# Patient Record
Sex: Male | Born: 1935 | Race: White | Hispanic: No | Marital: Married | Smoking: Former smoker
Health system: Southern US, Community
[De-identification: ages and names within clinical notes are randomized; demographics above are authoritative.]

## PROBLEM LIST (undated history)

## (undated) DIAGNOSIS — M1990 Unspecified osteoarthritis, unspecified site: Secondary | ICD-10-CM

## (undated) DIAGNOSIS — H269 Unspecified cataract: Secondary | ICD-10-CM

## (undated) DIAGNOSIS — F0390 Unspecified dementia without behavioral disturbance: Secondary | ICD-10-CM

## (undated) DIAGNOSIS — N400 Enlarged prostate without lower urinary tract symptoms: Secondary | ICD-10-CM

## (undated) DIAGNOSIS — K219 Gastro-esophageal reflux disease without esophagitis: Secondary | ICD-10-CM

## (undated) DIAGNOSIS — IMO0001 Reserved for inherently not codable concepts without codable children: Secondary | ICD-10-CM

## (undated) DIAGNOSIS — R413 Other amnesia: Secondary | ICD-10-CM

## (undated) DIAGNOSIS — J302 Other seasonal allergic rhinitis: Secondary | ICD-10-CM

## (undated) DIAGNOSIS — Z5189 Encounter for other specified aftercare: Secondary | ICD-10-CM

## (undated) DIAGNOSIS — I82401 Acute embolism and thrombosis of unspecified deep veins of right lower extremity: Secondary | ICD-10-CM

## (undated) HISTORY — DX: Other seasonal allergic rhinitis: J30.2

## (undated) HISTORY — DX: Unspecified osteoarthritis, unspecified site: M19.90

## (undated) HISTORY — PX: NASAL SINUS SURGERY: SHX719

## (undated) HISTORY — PX: TONSILLECTOMY: SUR1361

## (undated) HISTORY — DX: Reserved for inherently not codable concepts without codable children: IMO0001

## (undated) HISTORY — DX: Acute embolism and thrombosis of unspecified deep veins of right lower extremity: I82.401

## (undated) HISTORY — DX: Benign prostatic hyperplasia without lower urinary tract symptoms: N40.0

## (undated) HISTORY — DX: Gastro-esophageal reflux disease without esophagitis: K21.9

## (undated) HISTORY — DX: Unspecified cataract: H26.9

## (undated) HISTORY — DX: Encounter for other specified aftercare: Z51.89

## (undated) HISTORY — DX: Other amnesia: R41.3

---

## 1957-05-24 HISTORY — PX: OTHER SURGICAL HISTORY: SHX169

## 1988-05-24 HISTORY — PX: CARPAL TUNNEL RELEASE: SHX101

## 1997-12-26 ENCOUNTER — Ambulatory Visit (HOSPITAL_COMMUNITY): Admission: RE | Admit: 1997-12-26 | Discharge: 1997-12-26 | Payer: Self-pay | Admitting: Urology

## 1998-01-08 ENCOUNTER — Ambulatory Visit (HOSPITAL_COMMUNITY): Admission: RE | Admit: 1998-01-08 | Discharge: 1998-01-08 | Payer: Self-pay | Admitting: Internal Medicine

## 1999-06-28 ENCOUNTER — Emergency Department (HOSPITAL_COMMUNITY): Admission: EM | Admit: 1999-06-28 | Discharge: 1999-06-28 | Payer: Self-pay

## 2000-03-02 ENCOUNTER — Encounter: Payer: Self-pay | Admitting: Endocrinology

## 2000-03-02 ENCOUNTER — Encounter: Admission: RE | Admit: 2000-03-02 | Discharge: 2000-03-02 | Payer: Self-pay | Admitting: Endocrinology

## 2000-03-04 ENCOUNTER — Encounter (INDEPENDENT_AMBULATORY_CARE_PROVIDER_SITE_OTHER): Payer: Self-pay | Admitting: Specialist

## 2000-03-04 ENCOUNTER — Other Ambulatory Visit: Admission: RE | Admit: 2000-03-04 | Discharge: 2000-03-04 | Payer: Self-pay | Admitting: Urology

## 2000-03-05 ENCOUNTER — Encounter: Payer: Self-pay | Admitting: Endocrinology

## 2000-03-05 ENCOUNTER — Encounter: Admission: RE | Admit: 2000-03-05 | Discharge: 2000-03-05 | Payer: Self-pay | Admitting: Endocrinology

## 2000-05-10 ENCOUNTER — Encounter (INDEPENDENT_AMBULATORY_CARE_PROVIDER_SITE_OTHER): Payer: Self-pay

## 2000-05-10 ENCOUNTER — Ambulatory Visit (HOSPITAL_COMMUNITY): Admission: RE | Admit: 2000-05-10 | Discharge: 2000-05-10 | Payer: Self-pay | Admitting: Otolaryngology

## 2000-05-24 HISTORY — PX: SHOULDER SURGERY: SHX246

## 2000-09-21 ENCOUNTER — Encounter: Admission: RE | Admit: 2000-09-21 | Discharge: 2000-09-21 | Payer: Self-pay | Admitting: Urology

## 2000-09-21 ENCOUNTER — Encounter: Payer: Self-pay | Admitting: Urology

## 2000-09-23 ENCOUNTER — Encounter: Admission: RE | Admit: 2000-09-23 | Discharge: 2000-09-23 | Payer: Self-pay | Admitting: *Deleted

## 2000-09-23 ENCOUNTER — Encounter: Payer: Self-pay | Admitting: *Deleted

## 2000-09-27 ENCOUNTER — Other Ambulatory Visit: Admission: RE | Admit: 2000-09-27 | Discharge: 2000-09-27 | Payer: Self-pay | Admitting: *Deleted

## 2000-09-27 ENCOUNTER — Encounter (INDEPENDENT_AMBULATORY_CARE_PROVIDER_SITE_OTHER): Payer: Self-pay | Admitting: *Deleted

## 2000-10-11 ENCOUNTER — Encounter
Admission: RE | Admit: 2000-10-11 | Discharge: 2000-11-03 | Payer: Self-pay | Admitting: Physical Medicine and Rehabilitation

## 2000-10-14 ENCOUNTER — Ambulatory Visit (HOSPITAL_COMMUNITY)
Admission: RE | Admit: 2000-10-14 | Discharge: 2000-10-14 | Payer: Self-pay | Admitting: Physical Medicine and Rehabilitation

## 2000-10-14 ENCOUNTER — Encounter: Payer: Self-pay | Admitting: Physical Medicine and Rehabilitation

## 2000-12-06 ENCOUNTER — Encounter: Admission: RE | Admit: 2000-12-06 | Discharge: 2000-12-21 | Payer: Self-pay | Admitting: Orthopedic Surgery

## 2000-12-22 ENCOUNTER — Encounter: Admission: RE | Admit: 2000-12-22 | Discharge: 2001-01-24 | Payer: Self-pay | Admitting: Orthopedic Surgery

## 2001-05-04 ENCOUNTER — Ambulatory Visit (HOSPITAL_BASED_OUTPATIENT_CLINIC_OR_DEPARTMENT_OTHER): Admission: RE | Admit: 2001-05-04 | Discharge: 2001-05-05 | Payer: Self-pay | Admitting: Orthopedic Surgery

## 2001-05-16 ENCOUNTER — Encounter: Admission: RE | Admit: 2001-05-16 | Discharge: 2001-05-23 | Payer: Self-pay | Admitting: Orthopedic Surgery

## 2001-05-24 ENCOUNTER — Encounter: Admission: RE | Admit: 2001-05-24 | Discharge: 2001-08-22 | Payer: Self-pay | Admitting: Orthopedic Surgery

## 2002-04-25 ENCOUNTER — Ambulatory Visit (HOSPITAL_COMMUNITY): Admission: RE | Admit: 2002-04-25 | Discharge: 2002-04-25 | Payer: Self-pay | Admitting: Gastroenterology

## 2002-04-25 ENCOUNTER — Encounter (INDEPENDENT_AMBULATORY_CARE_PROVIDER_SITE_OTHER): Payer: Self-pay | Admitting: Specialist

## 2003-07-12 ENCOUNTER — Emergency Department (HOSPITAL_COMMUNITY): Admission: EM | Admit: 2003-07-12 | Discharge: 2003-07-12 | Payer: Self-pay | Admitting: Emergency Medicine

## 2003-07-23 ENCOUNTER — Encounter: Admission: RE | Admit: 2003-07-23 | Discharge: 2003-09-20 | Payer: Self-pay | Admitting: Orthopedic Surgery

## 2003-10-02 ENCOUNTER — Ambulatory Visit (HOSPITAL_COMMUNITY): Admission: RE | Admit: 2003-10-02 | Discharge: 2003-10-02 | Payer: Self-pay | Admitting: Orthopedic Surgery

## 2004-12-22 ENCOUNTER — Ambulatory Visit: Payer: Self-pay | Admitting: Gastroenterology

## 2005-03-29 ENCOUNTER — Ambulatory Visit (HOSPITAL_COMMUNITY): Admission: RE | Admit: 2005-03-29 | Discharge: 2005-03-29 | Payer: Self-pay | Admitting: Urology

## 2005-03-29 ENCOUNTER — Ambulatory Visit (HOSPITAL_BASED_OUTPATIENT_CLINIC_OR_DEPARTMENT_OTHER): Admission: RE | Admit: 2005-03-29 | Discharge: 2005-03-29 | Payer: Self-pay | Admitting: Urology

## 2005-03-29 ENCOUNTER — Encounter (INDEPENDENT_AMBULATORY_CARE_PROVIDER_SITE_OTHER): Payer: Self-pay | Admitting: *Deleted

## 2006-09-13 ENCOUNTER — Ambulatory Visit: Payer: Self-pay | Admitting: Gastroenterology

## 2006-11-04 ENCOUNTER — Ambulatory Visit: Payer: Self-pay | Admitting: Gastroenterology

## 2006-11-14 ENCOUNTER — Ambulatory Visit: Payer: Self-pay | Admitting: Gastroenterology

## 2006-12-08 ENCOUNTER — Ambulatory Visit (HOSPITAL_BASED_OUTPATIENT_CLINIC_OR_DEPARTMENT_OTHER): Admission: RE | Admit: 2006-12-08 | Discharge: 2006-12-08 | Payer: Self-pay | Admitting: Urology

## 2006-12-08 ENCOUNTER — Encounter (INDEPENDENT_AMBULATORY_CARE_PROVIDER_SITE_OTHER): Payer: Self-pay | Admitting: Urology

## 2007-05-23 ENCOUNTER — Encounter: Admission: RE | Admit: 2007-05-23 | Discharge: 2007-07-26 | Payer: Self-pay | Admitting: Orthopedic Surgery

## 2008-04-12 ENCOUNTER — Ambulatory Visit (HOSPITAL_COMMUNITY): Admission: RE | Admit: 2008-04-12 | Discharge: 2008-04-13 | Payer: Self-pay | Admitting: Otolaryngology

## 2008-04-12 ENCOUNTER — Encounter (INDEPENDENT_AMBULATORY_CARE_PROVIDER_SITE_OTHER): Payer: Self-pay | Admitting: Otolaryngology

## 2010-10-06 NOTE — Op Note (Signed)
NAME:  Raymond Hopkins, Raymond Hopkins             ACCOUNT NO.:  [id number]   MEDICAL RECORD NO.:  [id number]          PATIENT TYPE:  AMB   LOCATION:  [facility]                         FACILITY:  [facility]   PHYSICIAN:  Sigmund I. Patsi Sears, M.D.DATE OF BIRTH:  [birth year]   DATE OF PROCEDURE:  12/08/2006  DATE OF DISCHARGE:                               OPERATIVE REPORT   PREOPERATIVE DIAGNOSIS:  Elevated PSA.   POSTOPERATIVE DIAGNOSIS:  Elevated PSA.   OPERATION:  Prostate ultrasound and biopsy.   SURGEON:  Sigmund I. Patsi Sears, M.D.   ANESTHESIA:  General LMA.   PREPARATION:  After appropriate preanesthesia, the patient is brought to  the operating room and placed on the operating table in the dorsal  supine position where general LMA anesthesia was induced.  He was then  replaced in the dorsal lithotomy position where the pubis was prepped  with Betadine solution and draped in the usual fashion.   REVIEW OF HISTORY:  This 76 year old male has a history of PSA rise from  9.96 to 16.35.  The patient is unable to tolerate prostate biopsy in the  office and is for anesthesia with prostate ultrasound and biopsy.   PROCEDURE:  Prostate ultrasound was accomplished in the left lateral  decubitus position which shows a prostate size of 50 grams.  The seminal  vesicles were normal.  There were multiple areas of hypoechogenicity  within the prostate.  Multiple cores of tissue were taken, 12, from the  prostate, without difficulty.  There was no bleeding.  The patient was  awakened and taken to the recovery room in good condition.      Sigmund I. Patsi Sears, M.D.  Electronically Signed     SIT/MEDQ  D:  12/08/2006  T:  12/08/2006  Job:  [id number]

## 2010-10-06 NOTE — Discharge Summary (Signed)
NAME:  COPELAND, NEISEN NO.:  [id number]   MEDICAL RECORD NO.:  [id number]          PATIENT TYPE:  REC   LOCATION:  [facility]                         FACILITY:  [facility]   PHYSICIAN:  Zenia Resides, NP      DATE OF BIRTH:  15-Dec-1934   DATE OF ADMISSION:  05/23/2007  DATE OF DISCHARGE:                               DISCHARGE SUMMARY   </ADDENDUM  This is an addendum to job number [id number].   FOLLOWUP:  Ms. Strite was set up for followup with Dr. Amada Kingfisher-  Bonsu on Tuesday, the 20th, at 2:10 p.m.  She was instructed that if she  does indeed keep this appointment then she can cancel her followup  appointment with the nurse practitioner, Tammy Parrett, as followup care  will be provided.      Zenia Resides, NP     PB/MEDQ  D:  06/06/2007  T:  06/06/2007  Job:  [id number]

## 2010-10-06 NOTE — Op Note (Signed)
Raymond Hopkins, Raymond Hopkins             ACCOUNT NO.:  [id number]   MEDICAL RECORD NO.:  [id number]          PATIENT TYPE:  AMB   LOCATION:  [facility]                          FACILITY:  [facility]   PHYSICIAN:  Hermelinda Medicus, M.D.   DATE OF BIRTH:  27-Aug-1934   DATE OF PROCEDURE:  DATE OF DISCHARGE:                               OPERATIVE REPORT   PREOPERATIVE DIAGNOSIS:  Right ethmoid or anterior ethmoid and frontal  sinusitis.   POSTOPERATIVE DIAGNOSIS:  Right ethmoid or anterior ethmoid and frontal  sinusitis.   OPERATION:  Functional endoscopic sinus surgery with a right anterior  ethmoidectomy and frenulectomy.   OPERATOR:  Hermelinda Medicus, MD.   ANESTHESIA:  Local MAC with Dr. Noreene Larsson, 1% Xylocaine with epinephrine 4  mL and topical cocaine 200 mg.   The patient is aware of the risks and gains.  He is aware of the fact  that we are working around the base of skull and around the eye.  He has  had previous surgery in 2001 where he had bilateral frontal  ethmoidectomies and maxillary sinus ostial enlargements, but he has  considerable sinusitis issues with allergy issues with considerable  exposure to lost and allergens.  He is aware that he cannot do a lot of  heavy lifting right away and is to be alerted to these allergens and  exposed these.   PROCEDURE:  The patient was placed in the supine position under local  MAC anesthesia and was prepped and draped in the usual manner.  The  inferior turbinate was reduced slightly and the middle turbinate was  pushed right over against the ethmoid sinus.  We reduced that and pushed  it medially where we could then see the polypoid debris in the anterior  ethmoid sinus that was leading up into the frontal sinus.  We were able  to remove this polypoid debris using a 0-degree and a 70-degree scope  and then using a curved suction, we could place the suction up out into  the frontal sinus natural ostium, being very careful not to damage the  delicate mucous membrane.  This ostium was quite small, essentially  slightly larger than the diameter of our curved suction, making a little  more fragile and prone to obstruction, but now the polyps were removed  and that the scar tissue around the anterior ethmoid sinus was removed.  I think with the situation we would do very well.  Once this was  completed, we held the middle turbinate medial using a half of the  Gelfoam suction and then Telfa was placed in the right nose.  The  patient tolerated the procedure very well and was doing well  postoperatively.  His followup would be a 23-hour observation and then  he would be in my office in 3 days, then 10 days, 3 weeks, 6 weeks, and  3 months.           ______________________________  Hermelinda Medicus, M.D.     JC/MEDQ  D:  04/12/2008  T:  04/12/2008  Job:  [id number]   cc:   Pollyann Savoy, MD

## 2010-10-06 NOTE — H&P (Signed)
NAME:  Raymond Hopkins, Raymond Hopkins NO.:  [id number]   MEDICAL RECORD NO.:  [id number]          PATIENT TYPE:  OIB   LOCATION:                               FACILITY:  [facility]   PHYSICIAN:  Hermelinda Medicus, M.D.   DATE OF BIRTH:  Oct 25, 1934   DATE OF ADMISSION:  04/12/2008  DATE OF DISCHARGE:                              HISTORY & PHYSICAL   This patient is a 76 year old male who has been worked formally for [employer], is now retired, and has had bilateral functional endoscopic  sinus surgery back in 2001 where he had bilateral frontal, ethmoid, and  maxillary sinus ostial enlargement.  He has done really quite well, but  he does have considerable number of allergies and has taken Allegra and  Zyrtec and the usual allergy medications, but he also has 2 wood  fireplaces in his house and really the house is quite overheated and  dusty and dry.  He tries to use a humidifier also.  He also is very  active in outdoors in working with trees and grasses, and with his  allergies, he has had some sinus issues.  Since that time, he has been  on Z-Pak and Ceclor, Ceftin, Cipro.  Considerable history when Tequin  was used for this and then more recently Levaquin, and he also takes  Allegra 180.  He continued to have right frontal ethmoid region  headaches and a CAT scan was done showing an anterior ethmoid and  frontal sinusitis with fluid levels, and he now enters for a functional  endoscopic approach to the ethmoid and frontal sinus on the right.   His past history is remarkable in the fact that he has had prostate  hypertrophy.  He has had history of panic attacks and claustrophobia,  benign positional vertigo, gastroesophageal reflux.  He had a pilonidal  cystectomy and hemorrhoidectomy years ago and has had bilateral carpal  tunnel releases.  He has also had nasal fracture with reconstruction  multiple years back.   His medicines are listed in the chart, and he has no allergies  to  medications.   His EKG shows a sinus bradycardia, otherwise normal.  His rate is 59.  Hiatal hernia on chest x-ray, no active disease.   PHYSICAL EXAMINATION:  VITAL SIGNS:  Blood pressure of 124/79 and a  pulse of 54.  HEENT:  The ears are clear.  The tympanic membranes are clear.  In the  past, he has had mastoid disease but at this point, his temporal bones  are in good condition, and he has no evidence of any otitis media.  His  nose is clear as well as we can get this.  He has been on antibiotics  recently.  He has been on sulfa and Cipro for prostate swelling but has  been on antibiotics for his sinuses as well.  Oral cavity is clear.  Nasopharynx is clear of any ulceration or mass.  Larynx is clear.  True  cords, false cords, epiglottis, base of tongue are clear of ulceration  or mass.  True cord mobility, gag reflex, tongue mobility,  EOMs, facial  nerve are all symmetrical.  His lips, teeth, and gums are unremarkable.  Posterior triangles and scalp and skin are clear.  NECK:  Free of any thyromegaly, cervical adenopathy, or mass.  No  salivary gland abnormalities.  CHEST:  Clear.  No rales, rhonchi, or wheezes.  CARDIOVASCULAR:  No rubs, murmurs, or gallops.  EXTREMITIES:  Unremarkable.  He does have some arthritic issues and  takes medication for this.   His diagnoses are:  1. Right frontal ethmoid sinusitis, history of bilateral frontal,      ethmoid, and maxillary sinusitis status post functional endoscopic      sinus surgery.  2. History of allergic rhinitis.  3. History of severe dryness secondary to use of a woodstove.   Our plan is to do functional endoscopic sinus surgery and right frontal  ethmoidectomy under local MAC anesthesia.  He will be kept overnight, 1  night as a 23-hour observation because of his age.           ______________________________  Hermelinda Medicus, M.D.     JC/MEDQ  D:  04/12/2008  T:  04/12/2008  Job:  [id number]   cc:   Dr. Lynett Fish

## 2010-10-09 NOTE — H&P (Signed)
[facility]  Patient:    Raymond Hopkins, Raymond Hopkins                    MRN: [id number] Adm. Date:  [id number] Attending:  Waldon Merl CC:         Alfonse Alpers. Dagoberto Ligas, M.D., [address], [address], [place], [place]  [address]                         History and Physical  HISTORY OF PRESENT ILLNESS:  This patient is a 76 year old male who enters with a six-month history of sinusitis problems.  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  He has been also on antibiotics on several occasions for his sinuses, more recently has been Z-Pak and Tequin, and he obtained an MRI recently under Dr. Alfonse Alpers. Gegicks care which showed complete opacification of his ethmoid sinuses, some opacification of his frontal and partial opacification of the maxillary sinus.  His nose does not show excessive polyposis but he does have very large turbinates, especially on the right, approaching the size of a concha bullosa.  With this and with this long history of headache and essentially pain located around the left frontal ethmoid region primarily, he now enters for functional endoscopic sinus surgery to see if we can open both ethmoid sinuses, frontal sinuses and maxillary sinuses.  ALLERGIES:  He has no allergies to medications.  MEDICATIONS 1. Zithromax 250 mg q.d. 2. Aquatab D Dosepak one-half tablet. 3. Prilosec 25 mg b.i.d. 4. Doxazosin mesylate 8 mg one-half tablet q.d. 5. Testosterone patch. 6. Tri-mix injection twice a week.  PAST HISTORY:  He has had a bilateral release of a carpal tunnel syndrome, pilonidal cystectomy, hemorrhoidectomy, hemorrhoidectomy, a T&A and surgery for a nasal fracture several years ago and  had a nasal reconstruction with a good septal status.  He has a history of panic attacks and claustrophobia but he takes no medicine for this.  He is a very active individual physically -- he plays basketball and does a great deal of project-type work.  He has had some eosinophilic fasciitis, in remission, started in 1991, and he sees ______ in [place] about this.  He does have a reflux esophagitis, for which he is treated with the Prilosec.  He has a somewhat large prostate with hesitancy.  PHYSICAL EXAMINATION  VITAL SIGNS:  His physical examination reveals a blood pressure of 118/84 with a pulse of 60 and respirations 20.  HEENT:  His ears are clear.  Tympanic membranes look clear.  Nose:  He has had surgery in the past.  The septum is quite straight.  He does have quite large turbinates, in that he has considerable evidence of and history of sinusitis. Oral cavity looks clear of any ulceration or mass.  Nasopharynx is clear of any ulceration or mass.  The epiglottis and larynx looks free of any ulceration or mass.  No true cord or false cord, base of tongue, lateral pharyngeal wall abnormalities.  NECK:  His neck is free of any thyromegaly, cervical adenopathy or mass.  CHEST:  Clear.  No rales, rhonchi or wheezes.  CARDIOVASCULAR:  No opening snaps,  murmurs or gallops.  ABDOMEN:  Free of any organomegaly, tenderness or mass.  EXTREMITIES:  Unremarkable except for previous surgical procedures.  NEUROLOGIC:  Oriented x 3.  Cranial nerves intact.  INITIAL DIAGNOSES 1. Bilateral pansinusitis, ethmoid, frontal and maxillary, with history of    allergic rhinitis. 2. He also has a history of panic attacks and claustrophobia. 3. He also has this eosinophilic fasciitis. 4. Benign positional vertigo. 5. Gastroesophageal reflux and is on Prilosec. 6. History of pilonidal cystectomy and hemorrhoidectomy and the bilateral    carpal tunnel releases. 7. History of nasal  fracture with reconstruction.  LABORATORY AND X-RAY FINDINGS:  His chest x-ray and EKG are within normal limits. DD:  05/10/00 TD:  05/11/00 Job: [id number]

## 2010-10-09 NOTE — Op Note (Signed)
[facility]  Patient:    Raymond Hopkins, Raymond Hopkins                    MRN: [id number] Proc. Date: 05/10/00 Adm. Date:  [id number] Attending:  Waldon Merl CC:         Alfonse Alpers. Dagoberto Ligas, M.D., [address], [address], [place], [place] [address]                           Operative Report  PREOPERATIVE DIAGNOSIS:  Bilateral ethmoid and maxillary and frontal sinusitis.  POSTOPERATIVE DIAGNOSIS:  Bilateral ethmoid and maxillary and frontal sinusitis.  OPERATION:  Functional endoscopic sinus surgery with a bilateral ethmoidectomy, frontalotomy, and maxillary sinus ostial enlargement with reduction of turbinates.  SURGEON:  Keturah Barre, M.D.  ANESTHESIA:  Local 1% Xylocaine with epinephrine, topical cocaine 200 mg, and with anesthesia MAC with Dr. Shireen Quan.  DESCRIPTION OF PROCEDURE:  The patient was placed in the supine position under local anesthesia was prepped and draped in the appropriate manner using Hibiclens x 3 and the usual head drape.  Once the topical anesthesia was instilled, then 1% Xylocaine with epinephrine was also used, and the left ethmoid was first approached.  The MRIs were in the operating room for further study of the anatomy of the sinuses.  The patient was also well aware of the risks and gains and had been discussed about the gains for his breathing, for his sinus drainage, but also was aware of the risk of working around the frontal lobe and the orbits.  The first sinus approached was using the 0 degree scope and the straight Blakesley-Wildes, and the upbiting Blakesley-Wildes where a considerable amount of polypoid debris was removed as well as some thick mucoid material which was suctioned.  The patient has already been on antibiotic and will be continued on antibiotic intravenously. Once the ethmoid sinus was opened, then we could use the scope to see more superiorly and used the 70 degree scope and worked our way  superior and anterior where we could see up into the frontal sinus, the natural ostium, and this was just found to be filled with polypoid debris, but we were able to remove this via the frontal region and the ethmoid sinus.  This was the major area of pain and once this was opened, feel that this should really help control this pain.  With the ethmoid clear, and with the ethmoid volar completely taken down, the middle turbinate was pushed medially aggressively, so it would give plenty of space for the ethmoid to drain.  Then the maxillary sinus was reviewed, and the uncinate process was then located.  The natural ostium could not be seen.  It was buried in polypoid disease and scar tissue and mucous membrane changes.  The curved suction was used to palpate in this area and find the natural ostium and then the side-biting forcep and the angled Blakesley-Wildes were used to increase this natural ostium to about five times its normal size.  We could look inside the sinus now and see the gray, thickened mucous membrane with reasonable drainage.  I felt that this membrane would at least in part revert back to a more normal status.  This sinus was suctioned.  Attention was then carried to the right side, where again the ethmoid sinus was approached, pushing the middle turbinate medial, reducing the inferior turbinate aggressively.  The polyp of the sinus  was found to be filled with polyps.  These polyps were then removed.  We worked up anteriorly and superiorly as closely as possible.  The frontal sinus ostium was found to be quite small.  We removed all of the polyps.  The polyps along the base of the skull were also found to be just moderate, and we worked posteriorly toward the sphenoid sinus.  Once the ethmoid sinus was completely opened, we felt that all of the cells were opened, and the mucous was suctioned from these cells, and then again, the area of the maxillary sinus was approached.   Again, the uncinate process region was buried in polypoid debris and scar tissue, and again we needed to find this just by palpation which we did using the curved suction.  The side-biting forcep and Blakesley-Wildes were used to again increase the size of this, and we used the 0 and the 70 degree scopes to evaluate the ethmoid, frontal, and maxillary sinuses on this side and once we opened the sinus, we could suction the maxillary sinus, and then the thick, gray membrane that was seen on the opposite side was again seen.  Again this was left intact, as I think it will revert back to a more normal status.  Once this was achieved, a thin gel film and Gelfoam was placed within the ethmoid sinus.  Telfa was placed within the nose, and the patient tolerated the procedure very well and was taken to recovery room in good condition.  The patients follow-up will be tomorrow and then in one week, three weeks, six weeks, six months, and a year. DD:  05/10/00 TD:  05/11/00 Job: [id number]

## 2010-10-09 NOTE — Op Note (Signed)
[place]. [facility]  Patient:    Raymond Hopkins, Raymond Hopkins Visit Number: [id number] MRN: [id number]          Service Type: DSU Location: [facility] Attending Physician:  Cain Sieve Dictated by:   Vania Rea. Supple, M.D. Proc. Date: 05/04/01 Admit Date:  05/04/2001                             Operative Report  PREOPERATIVE DIAGNOSES: 1. 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 2. Right shoulder acromioclavicular joint arthrosis. 3. Possible rotator cuff tear.  POSTOPERATIVE DIAGNOSES: 1. Right shoulder superior labral tear (type 2 superior labrum anterior and    posterior lesion). 2. 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 3. Right shoulder acromioclavicular joint arthrosis. 4. Significant partial-thickness articular sided rotator cuff tear. 5. Extensive degenerative posterior labral tear.  PROCEDURES: 1. Right shoulder glenohumeral joint diagnostic arthroscopy. 2. Debridement of extensive posterior labral tear. 3. Repair of type 2 superior labrum anterior and posterior lesion. 3. Debridement of partial-thickness articular sided rotator cuff tear. 4. Arthroscopic subacromial decompression and bursectomy. 5. Arthroscopic distal clavicle resection.  SURGEON OF RECORD:  Vania Rea. Supple, M.D.  ASSISTANT:  Alexzandrew L. Perkins, P.A.-C.  ANESTHESIA:  General endotracheal.  ESTIMATED BLOOD LOSS:  Minimal.  HISTORY:  Raymond Hopkins is a 76 year old gentleman, who has had persistent difficulties with right shoulder pain particularly when he performs overhead reaching and throwing.  His examination showed positive OBriens test and positive impingement sign suggestive of rotator cuff versus labral pathology.  MRI scan does confirm significant tendinosis of the rotator cuff.  Due to his ongoing pain and functional limitations, he is brought to the operating room at this time for planned right shoulder  arthroscopy as described above.  Preoperatively, he was counselled on treatment options, as well as risks versus benefits thereof, possible complications, bleeding, infection, neurovascular injury, persistence of pain, loss of motion, and possible need for additional surgery were all reviewed.  He understands and accepted and agreed with our planned procedure.  PROCEDURE IN DETAIL:  After undergoing routine preoperative evaluation, an attempt was made at placing an interscalene block.  The patient, however, was unable to tolerate the procedure.  It was then aborted.  Subsequently was placed supine on the operating table and underwent smooth induction of a general endotracheal anesthesia.  He was turned to the left lateral decubitus position on a bean bag and appropriately padded and protected.  The right arm was suspended to 70/30 position with 10 pounds of traction.  An examination under anesthesia showed no obvious glenohumeral instability.  The shoulder girdle region was sterilely prepped and draped in a standard fashion. Received prophylactic antibiotics.  Standard posterior portals were established in the glenohumeral joint and the anterior portal was established under direct visualization.  The glenohumeral articular surfaces were in generally good condition, although, the glenoid did show some diffuse mild chondromalacia.  There was evidence for significant tearing of the anterior/superior labrum with complete detachment of the biceps anchor from the 11 oclock to the 1 oclock position.  In addition, there was an extensive degenerative tear of the posterior labrum, although the labrum was not detached, but rather it was degenerated on its most peripheral margin.  We introduced the shaver from the posterior portal and performed an extensive debridement of the degenerative posterior labral tear down to stable-appearing tissue.  There was no obvious disruption of the posterior capsular  attachments however.  Next, we evaluated the rotator cuff and there was evidence for a significant partial-thickness articular sided rotator cuff tear and this was debrided with the shaver to healthy-appearing tissue.  The biceps tendon at the level of the rotator cuff interval was in good condition.  We then established an accessory portal off the lateral margin of the acromion entering the joint just posterior to the biceps and passing through the rotator cuff at the musculotendinous junction.  A bur was introduced and we created a bony cancellous trough from the 11 oclock to the 1 oclock position across the superior glenoid.  The shaver was introduced and used to remove all residual soft tissue debris.  We placed two Arthrex Biofast tack suture anchors - one at the 11:30 position, and one at the 1 oclock position.  The free limbs of the suture anchor were then passed through the superior labrum and the biceps anchor utilizing the Mitek suture retriever.  These were then tied utilizing a standard sliding arthroscopic notch with multiple overhand throws and alternating posts. This brought the superior labrum and biceps anchor down into excellent apposition against the cancellous bony bed on the superior glenoid.  We then completed the inspection of the glenohumeral joint and did perform a general chondroplasty of the glenoid and removed residual debris.  Fluid and instruments were then removed.  I should mention that prior to making the accessory portal, we did drop the arm down to approximately 50 degrees of abduction.  After completion of the glenohumeral work, the arm was then dropped down to 30 degrees of abduction with the arthroscopic introduced in the subacromial space in the posterior portal and directed lateral portal sitting in the subacromial space.  Bony proliferative bursal tissue was removed with a combination of the shaver and the Arthrex wand.  The wand was used to remove  the periosteum from the undersurface at the anterior half of the acromion.  CA ligament was divided and resected distally and the wand was  used for hemostasis.  Bur was then used to perform a subacromial decompression creating a type 1 morphology.  A portal was then established directly anterior to the distal clavicle and distal clavicle resection was performed with the bur.  Care was taken to make sure that the entire circumference of the distal clavicle could be visualized to ensure adequate removal of bone.  We then completed the subacromial bursectomy and carefully inspected the bursal surface of the rotator cuff.  This showed no obvious full-thickness tears. There was some subtle fraying at the level of the greater tuberosity, but probing in this area showed an excellent caliber of the residual tendon and it was not felt that any type of repair was necessary.  At this point, we achieved final hemostasis.  Fluid and instruments were removed.  The portals were closed with Steri-Strips.  Thirty cc of 0.5% Marcaine with epinephrine was instilled about the portals and into the subacromial space.  A bulky, dry dressing was then taped about the right shoulder.  The right arm was placed in a shoulder immobilizer.  The patient was rolled supine, extubated and taken to recovery room in stable condition. Dictated by:   Vania Rea. Supple, M.D. Attending Physician:  Cain Sieve DD:  05/04/01 TD:  05/04/01 Job: [phone]

## 2010-10-09 NOTE — Op Note (Signed)
NAME:  OLUWADAMILOLA, ROSAMOND             ACCOUNT NO.:  [id number]   MEDICAL RECORD NO.:  [id number]          PATIENT TYPE:  AMB   LOCATION:  [facility]                         FACILITY:  [facility]   PHYSICIAN:  Sigmund I. Patsi Sears, M.D.DATE OF BIRTH:  [birth year]   DATE OF PROCEDURE:  03/29/2005  DATE OF DISCHARGE:                                 OPERATIVE REPORT   PREOPERATIVE DIAGNOSIS:  Elevated PSA.   POSTOPERATIVE DIAGNOSIS:  Elevated PSA.   OPERATION:  Transrectal needle biopsy of the prostate and prostate  ultrasound.   SURGEON:  Sigmund I. Patsi Sears, M.D.   PREPARATION:  After appropriate preanesthesia, the patient was brought to  the operating room, placed on the operating table in dorsal supine position  where a general LMA anesthesia was introduced. He was then replaced in the  left lateral decubitus position where a spinal anesthetic was introduced.  The prostate was ultrasounded, and was found to be  47.64 mL in volume. The  rectal wall and seminal vesicles were within normal limits.   Twelve cords of tissue were taken from various sites in the prostate, and  sent to the laboratory for examination. The patient had no bleeding. He was  awakened and taken to the recovery room in good condition.      Sigmund I. Patsi Sears, M.D.  Electronically Signed     SIT/MEDQ  D:  03/29/2005  T:  03/30/2005  Job:  [id number]

## 2011-02-23 LAB — URINALYSIS, ROUTINE W REFLEX MICROSCOPIC
Bilirubin Urine: NEGATIVE
Glucose, UA: NEGATIVE
Hgb urine dipstick: NEGATIVE
Ketones, ur: NEGATIVE
Nitrite: NEGATIVE
Protein, ur: NEGATIVE
Specific Gravity, Urine: 1.027
Urobilinogen, UA: 0.2
pH: 7

## 2011-02-23 LAB — CBC
HCT: 40.2
Hemoglobin: 13.7
MCHC: 34.2
MCV: 92.7
Platelets: 174
RBC: 4.34
RDW: 12.4
WBC: 3.3 — ABNORMAL LOW

## 2011-03-08 LAB — POCT HEMOGLOBIN-HEMACUE: Hemoglobin: 13.8

## 2011-07-15 ENCOUNTER — Telehealth: Payer: Self-pay | Admitting: Gastroenterology

## 2011-07-16 NOTE — Telephone Encounter (Signed)
Patient has been getting his Protonix from his PCP but he is trying to find a new PCP and wants to see if Dr Jarold Motto will fill the rx I have advised pt he will need OV for any rxs, since he has not seen Dr Jarold Motto since 2008 he said he will just find a new PCP.

## 2011-09-15 ENCOUNTER — Encounter: Payer: Self-pay | Admitting: Gastroenterology

## 2011-09-20 ENCOUNTER — Encounter: Payer: Self-pay | Admitting: Gastroenterology

## 2011-10-06 ENCOUNTER — Ambulatory Visit (AMBULATORY_SURGERY_CENTER): Payer: Medicare Other | Admitting: *Deleted

## 2011-10-06 ENCOUNTER — Encounter: Payer: Self-pay | Admitting: Gastroenterology

## 2011-10-06 VITALS — Ht 68.5 in | Wt 166.0 lb

## 2011-10-06 DIAGNOSIS — Z1211 Encounter for screening for malignant neoplasm of colon: Secondary | ICD-10-CM

## 2011-10-06 MED ORDER — PEG-KCL-NACL-NASULF-NA ASC-C 100 G PO SOLR: ORAL | Status: DC

## 2011-10-20 ENCOUNTER — Ambulatory Visit (AMBULATORY_SURGERY_CENTER): Payer: Medicare Other | Admitting: Gastroenterology

## 2011-10-20 ENCOUNTER — Encounter: Payer: Self-pay | Admitting: Gastroenterology

## 2011-10-20 VITALS — BP 131/64 | HR 45 | Temp 95.4°F | Resp 18 | Ht 68.0 in | Wt 166.0 lb

## 2011-10-20 DIAGNOSIS — Z1211 Encounter for screening for malignant neoplasm of colon: Secondary | ICD-10-CM

## 2011-10-20 DIAGNOSIS — Z86010 Personal history of colonic polyps: Secondary | ICD-10-CM

## 2011-10-20 MED ORDER — SODIUM CHLORIDE 0.9 % IV SOLN: 500.0000 mL | INTRAVENOUS | Status: DC

## 2011-10-20 NOTE — Op Note (Signed)
[facility] [address] [place], [place]  [address]  COLONOSCOPY PROCEDURE REPORT  PATIENT:  Raymond Hopkins, Raymond Hopkins  MR#:  [id number] BIRTHDATE:  Sep 03, 1934, 76 yrs. old  GENDER:  male ENDOSCOPIST:  Vania Rea. Jarold Motto, MD, [facility] REF. BY: PROCEDURE DATE:  10/20/2011 PROCEDURE:  Colonoscopy 19147 ASA CLASS:  Class II INDICATIONS:  history of polyps MEDICATIONS:   propofol (Diprivan) 100 mg IV  DESCRIPTION OF PROCEDURE:   After the risks and benefits and of the procedure were explained, informed consent was obtained. Digital rectal exam was performed and revealed no abnormalities. The LB CF-[id number] endoscope was introduced through the anus and advanced to the cecum, which was identified by both the appendix and ileocecal valve.  The quality of the prep was good, using MoviPrep.  The instrument was then slowly withdrawn as the colon was fully examined. <<PROCEDUREIMAGES>>  FINDINGS:  No polyps or cancers were seen.  This was otherwise a normal examination of the colon.   Retroflexed views in the rectum revealed no abnormalities.    The scope was then withdrawn from the patient and the procedure completed.  COMPLICATIONS:  None ENDOSCOPIC IMPRESSION: 1) No polyps or cancers 2) Otherwise normal examination RECOMMENDATIONS: f/u prn.2 negative exams over 10y period.  REPEAT EXAM:  No  ______________________________ Vania Rea. Jarold Motto, MD, [facility]  CC:  n. eSIGNED:   Vania Rea. Lynda Wanninger at 10/20/2011 01:57 PM  Lauretta Chester, [phone]

## 2011-10-20 NOTE — Progress Notes (Signed)
Patient did not experience any of the following events: a burn prior to discharge; a fall within the facility; wrong site/side/patient/procedure/implant event; or a hospital transfer or hospital admission upon discharge from the facility. (G8907) Patient did not have preoperative order for IV antibiotic SSI prophylaxis. (G8918)  

## 2011-10-20 NOTE — Patient Instructions (Signed)
YOU HAD AN ENDOSCOPIC PROCEDURE TODAY AT THE [facility]: Refer to the procedure report that was given to you for any specific questions about what was found during the examination.  If the procedure report does not answer your questions, please call your gastroenterologist to clarify.  If you requested that your care partner not be given the details of your procedure findings, then the procedure report has been included in a sealed envelope for you to review at your convenience later.  YOU SHOULD EXPECT: Some feelings of bloating in the abdomen. Passage of more gas than usual.  Walking can help get rid of the air that was put into your GI tract during the procedure and reduce the bloating. If you had a lower endoscopy (such as a colonoscopy or flexible sigmoidoscopy) you may notice spotting of blood in your stool or on the toilet paper. If you underwent a bowel prep for your procedure, then you may not have a normal bowel movement for a few days.  DIET: Your first meal following the procedure should be a light meal and then it is ok to progress to your normal diet.  A half-sandwich or bowl of soup is an example of a good first meal.  Heavy or fried foods are harder to digest and may make you feel nauseous or bloated.  Likewise meals heavy in dairy and vegetables can cause extra gas to form and this can also increase the bloating.  Drink plenty of fluids but you should avoid alcoholic beverages for 24 hours.  ACTIVITY: Your care partner should take you home directly after the procedure.  You should plan to take it easy, moving slowly for the rest of the day.  You can resume normal activity the day after the procedure however you should NOT DRIVE or use heavy machinery for 24 hours (because of the sedation medicines used during the test).    SYMPTOMS TO REPORT IMMEDIATELY: A gastroenterologist can be reached at any hour.  During normal business hours, 8:30 AM to 5:00 PM Monday through Friday,  call [phone].  After hours and on weekends, please call the GI answering service at [phone] who will take a message and have the physician on call contact you.   Following lower endoscopy (colonoscopy or flexible sigmoidoscopy):  Excessive amounts of blood in the stool  Significant tenderness or worsening of abdominal pains  Swelling of the abdomen that is new, acute  Fever of 100F or higher  Following upper endoscopy (EGD)  Vomiting of blood or coffee ground material  New chest pain or pain under the shoulder blades  Painful or persistently difficult swallowing  New shortness of breath  Fever of 100F or higher  Black, tarry-looking stools  FOLLOW UP: If any biopsies were taken you will be contacted by phone or by letter within the next 1-3 weeks.  Call your gastroenterologist if you have not heard about the biopsies in 3 weeks.  Our staff will call the home number listed on your records the next business day following your procedure to check on you and address any questions or concerns that you may have at that time regarding the information given to you following your procedure. This is a courtesy call and so if there is no answer at the home number and we have not heard from you through the emergency physician on call, we will assume that you have returned to your regular daily activities without incident.  SIGNATURES/CONFIDENTIALITY: You and/or your care   partner have signed paperwork which will be entered into your electronic medical record.  These signatures attest to the fact that that the information above on your After Visit Summary has been reviewed and is understood.  Full responsibility of the confidentiality of this discharge information lies with you and/or your care-partner.  

## 2011-10-21 ENCOUNTER — Telehealth: Payer: Self-pay | Admitting: *Deleted

## 2011-10-21 NOTE — Telephone Encounter (Signed)
  Follow up Call-  Call back number 10/20/2011  Post procedure Call Back phone  # [phone]  Permission to leave phone message Yes     Patient questions:  Do you have a fever, pain , or abdominal swelling? no Pain Score  0 *  Have you tolerated food without any problems? yes  Have you been able to return to your normal activities? yes  Do you have any questions about your discharge instructions: Diet   no Medications  no Follow up visit  no  Do you have questions or concerns about your Care? no  Actions: * If pain score is 4 or above: No action needed, pain <4.

## 2011-12-13 ENCOUNTER — Other Ambulatory Visit: Payer: Self-pay | Admitting: Neurology

## 2011-12-13 DIAGNOSIS — R439 Unspecified disturbances of smell and taste: Secondary | ICD-10-CM

## 2011-12-14 ENCOUNTER — Other Ambulatory Visit: Payer: Self-pay | Admitting: Neurology

## 2011-12-14 DIAGNOSIS — R439 Unspecified disturbances of smell and taste: Secondary | ICD-10-CM

## 2011-12-22 ENCOUNTER — Ambulatory Visit
Admission: RE | Admit: 2011-12-22 | Discharge: 2011-12-22 | Disposition: A | Discharge status: Home / Self Care | Payer: Medicare Other | Source: Ambulatory Visit | Attending: Neurology | Admitting: Neurology

## 2011-12-22 DIAGNOSIS — R439 Unspecified disturbances of smell and taste: Secondary | ICD-10-CM

## 2011-12-22 MED ORDER — GADOBENATE DIMEGLUMINE 529 MG/ML IV SOLN
15.0000 mL | Freq: Once | INTRAVENOUS | Status: AC | PRN
  Administered 2011-12-22: 15 mL via INTRAVENOUS

## 2012-02-13 ENCOUNTER — Emergency Department (HOSPITAL_COMMUNITY): Payer: Medicare Other

## 2012-02-13 ENCOUNTER — Encounter (HOSPITAL_COMMUNITY): Payer: Self-pay | Admitting: *Deleted

## 2012-02-13 ENCOUNTER — Emergency Department (HOSPITAL_COMMUNITY)
Admission: EM | Admit: 2012-02-13 | Discharge: 2012-02-14 | Disposition: A | Discharge status: Home / Self Care | Payer: Medicare Other | Attending: Emergency Medicine | Admitting: Emergency Medicine

## 2012-02-13 DIAGNOSIS — M129 Arthropathy, unspecified: Secondary | ICD-10-CM | POA: Insufficient documentation

## 2012-02-13 DIAGNOSIS — S8010XA Contusion of unspecified lower leg, initial encounter: Secondary | ICD-10-CM | POA: Insufficient documentation

## 2012-02-13 DIAGNOSIS — S139XXA Sprain of joints and ligaments of unspecified parts of neck, initial encounter: Secondary | ICD-10-CM | POA: Insufficient documentation

## 2012-02-13 DIAGNOSIS — Z87891 Personal history of nicotine dependence: Secondary | ICD-10-CM | POA: Insufficient documentation

## 2012-02-13 DIAGNOSIS — S39012A Strain of muscle, fascia and tendon of lower back, initial encounter: Secondary | ICD-10-CM

## 2012-02-13 DIAGNOSIS — S335XXA Sprain of ligaments of lumbar spine, initial encounter: Secondary | ICD-10-CM | POA: Insufficient documentation

## 2012-02-13 DIAGNOSIS — Y998 Other external cause status: Secondary | ICD-10-CM | POA: Insufficient documentation

## 2012-02-13 DIAGNOSIS — S20219A Contusion of unspecified front wall of thorax, initial encounter: Secondary | ICD-10-CM

## 2012-02-13 DIAGNOSIS — N400 Enlarged prostate without lower urinary tract symptoms: Secondary | ICD-10-CM | POA: Insufficient documentation

## 2012-02-13 DIAGNOSIS — Y93I9 Activity, other involving external motion: Secondary | ICD-10-CM | POA: Insufficient documentation

## 2012-02-13 DIAGNOSIS — S161XXA Strain of muscle, fascia and tendon at neck level, initial encounter: Secondary | ICD-10-CM

## 2012-02-13 DIAGNOSIS — K219 Gastro-esophageal reflux disease without esophagitis: Secondary | ICD-10-CM | POA: Insufficient documentation

## 2012-02-13 MED ORDER — OXYCODONE-ACETAMINOPHEN 5-325 MG PO TABS: 1.0000 | ORAL_TABLET | Freq: Four times a day (QID) | ORAL | Status: DC | PRN

## 2012-02-13 MED ORDER — OXYCODONE-ACETAMINOPHEN 5-325 MG PO TABS
1.0000 | ORAL_TABLET | Freq: Once | ORAL | Status: AC
  Administered 2012-02-13: 1 via ORAL
  Filled 2012-02-13: qty 1

## 2012-02-13 MED ORDER — DIAZEPAM 5 MG PO TABS: 5.0000 mg | ORAL_TABLET | Freq: Four times a day (QID) | ORAL | Status: DC | PRN

## 2012-02-13 MED ORDER — DIAZEPAM 5 MG PO TABS
5.0000 mg | ORAL_TABLET | Freq: Once | ORAL | Status: AC
  Administered 2012-02-13: 5 mg via ORAL
  Filled 2012-02-13: qty 1

## 2012-02-13 NOTE — Discharge Instructions (Signed)
Tonight all your x-rays are negative.  You have been given a prescription for Valium, which acts as a muscle relaxer, as well as a stronger pain medication, and her routine, Vicodin please use one or the other, but not both with the use of the Valium.  Please be very careful at night, getting up.  Make, sure that you have, your balance before, standing, and walking
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
## 2012-02-14 NOTE — ED Provider Notes (Signed)
Medical screening examination/treatment/procedure(s) were performed by non-physician practitioner and as supervising physician I was immediately available for consultation/collaboration.    Shelda Jakes, MD 02/14/12 [phone]

## 2012-08-13 ENCOUNTER — Observation Stay (HOSPITAL_COMMUNITY)
Admission: EM | Admit: 2012-08-13 | Discharge: 2012-08-15 | Disposition: A | Discharge status: Home / Self Care | Payer: Medicare Other | Attending: Internal Medicine | Admitting: Internal Medicine

## 2012-08-13 ENCOUNTER — Emergency Department (HOSPITAL_COMMUNITY): Payer: Medicare Other

## 2012-08-13 ENCOUNTER — Encounter (HOSPITAL_COMMUNITY): Payer: Self-pay | Admitting: *Deleted

## 2012-08-13 DIAGNOSIS — N400 Enlarged prostate without lower urinary tract symptoms: Secondary | ICD-10-CM | POA: Insufficient documentation

## 2012-08-13 DIAGNOSIS — I951 Orthostatic hypotension: Principal | ICD-10-CM | POA: Insufficient documentation

## 2012-08-13 DIAGNOSIS — G319 Degenerative disease of nervous system, unspecified: Secondary | ICD-10-CM | POA: Insufficient documentation

## 2012-08-13 DIAGNOSIS — M1990 Unspecified osteoarthritis, unspecified site: Secondary | ICD-10-CM

## 2012-08-13 DIAGNOSIS — M129 Arthropathy, unspecified: Secondary | ICD-10-CM | POA: Insufficient documentation

## 2012-08-13 DIAGNOSIS — Z79899 Other long term (current) drug therapy: Secondary | ICD-10-CM | POA: Insufficient documentation

## 2012-08-13 DIAGNOSIS — R55 Syncope and collapse: Secondary | ICD-10-CM | POA: Diagnosis present

## 2012-08-13 DIAGNOSIS — R112 Nausea with vomiting, unspecified: Secondary | ICD-10-CM | POA: Insufficient documentation

## 2012-08-13 DIAGNOSIS — I498 Other specified cardiac arrhythmias: Secondary | ICD-10-CM | POA: Insufficient documentation

## 2012-08-13 DIAGNOSIS — E291 Testicular hypofunction: Secondary | ICD-10-CM | POA: Insufficient documentation

## 2012-08-13 DIAGNOSIS — J329 Chronic sinusitis, unspecified: Secondary | ICD-10-CM | POA: Insufficient documentation

## 2012-08-13 DIAGNOSIS — K219 Gastro-esophageal reflux disease without esophagitis: Secondary | ICD-10-CM | POA: Insufficient documentation

## 2012-08-13 DIAGNOSIS — R5381 Other malaise: Secondary | ICD-10-CM | POA: Insufficient documentation

## 2012-08-13 LAB — BASIC METABOLIC PANEL
BUN: 19 mg/dL (ref 6–23)
Chloride: 104 mEq/L (ref 96–112)
Creatinine, Ser: 1.25 mg/dL (ref 0.50–1.35)
GFR calc Af Amer: 63 mL/min — ABNORMAL LOW (ref >90)
GFR calc non Af Amer: 55 mL/min — ABNORMAL LOW (ref >90)
Glucose, Bld: 103 mg/dL — ABNORMAL HIGH (ref 70–99)
Potassium: 3.6 mEq/L (ref 3.5–5.1)

## 2012-08-13 LAB — GLUCOSE, CAPILLARY: Glucose-Capillary: 152 mg/dL — ABNORMAL HIGH (ref 70–99)

## 2012-08-13 LAB — CBC
HCT: 34.1 % — ABNORMAL LOW (ref 39.0–52.0)
Hemoglobin: 11 g/dL — ABNORMAL LOW (ref 13.0–17.0)
MCHC: 32.3 g/dL (ref 30.0–36.0)
MCV: 80.8 fL (ref 78.0–100.0)
RDW: 14.8 % (ref 11.5–15.5)

## 2012-08-13 LAB — TROPONIN I: Troponin I: <0.3 ng/mL (ref <0.30)

## 2012-08-13 LAB — LIPID PANEL
HDL: 51 mg/dL (ref >39)
LDL Cholesterol: 101 mg/dL — ABNORMAL HIGH (ref 0–99)
Triglycerides: 103 mg/dL (ref <150)

## 2012-08-13 MED ORDER — POLYETHYLENE GLYCOL 3350 17 G PO PACK
17.0000 g | PACK | Freq: Every day | ORAL | Status: DC | PRN
  Filled 2012-08-13: qty 1

## 2012-08-13 MED ORDER — SODIUM CHLORIDE 0.9 % IV SOLN
INTRAVENOUS | Status: DC
  Administered 2012-08-13 – 2012-08-15 (×4): via INTRAVENOUS

## 2012-08-13 MED ORDER — PANTOPRAZOLE SODIUM 40 MG PO TBEC
40.0000 mg | DELAYED_RELEASE_TABLET | Freq: Every day | ORAL | Status: DC
  Administered 2012-08-14 – 2012-08-15 (×2): 40 mg via ORAL
  Filled 2012-08-13 (×3): qty 1

## 2012-08-13 MED ORDER — ONDANSETRON HCL 4 MG/2ML IJ SOLN
4.0000 mg | Freq: Four times a day (QID) | INTRAMUSCULAR | Status: DC | PRN
  Administered 2012-08-13: 4 mg via INTRAVENOUS
  Filled 2012-08-13: qty 2

## 2012-08-13 MED ORDER — ASPIRIN EC 325 MG PO TBEC
325.0000 mg | DELAYED_RELEASE_TABLET | Freq: Every day | ORAL | Status: DC
  Administered 2012-08-13 – 2012-08-15 (×3): 325 mg via ORAL
  Filled 2012-08-13 (×3): qty 1

## 2012-08-13 MED ORDER — ONDANSETRON HCL 4 MG PO TABS: 4.0000 mg | ORAL_TABLET | Freq: Four times a day (QID) | ORAL | Status: DC | PRN

## 2012-08-13 MED ORDER — TESTOSTERONE 50 MG/5GM (1%) TD GEL
5.0000 g | Freq: Every day | TRANSDERMAL | Status: DC
  Administered 2012-08-13 – 2012-08-15 (×3): 5 g via TRANSDERMAL
  Filled 2012-08-13 (×4): qty 5

## 2012-08-13 MED ORDER — SODIUM CHLORIDE 0.9 % IJ SOLN
3.0000 mL | Freq: Two times a day (BID) | INTRAMUSCULAR | Status: DC
  Administered 2012-08-13: 3 mL via INTRAVENOUS

## 2012-08-13 MED ORDER — DOCUSATE SODIUM 100 MG PO CAPS
100.0000 mg | ORAL_CAPSULE | Freq: Two times a day (BID) | ORAL | Status: DC
  Administered 2012-08-15: 100 mg via ORAL
  Filled 2012-08-13 (×5): qty 1

## 2012-08-13 MED ORDER — HYDROCODONE-ACETAMINOPHEN 5-325 MG PO TABS
1.5000 | ORAL_TABLET | Freq: Four times a day (QID) | ORAL | Status: DC | PRN
  Administered 2012-08-13: 1.5 via ORAL
  Filled 2012-08-13: qty 2

## 2012-08-13 MED ORDER — DIAZEPAM 5 MG PO TABS: 5.0000 mg | ORAL_TABLET | Freq: Four times a day (QID) | ORAL | Status: DC | PRN

## 2012-08-13 NOTE — ED Notes (Signed)
md made aware pt stable during orthostatic vital signs. Pt will be placed on bedside commode.

## 2012-08-13 NOTE — ED Notes (Signed)
hospitalist at bedside

## 2012-08-13 NOTE — ED Provider Notes (Signed)
History     CSN: [id number]  Arrival date & time 08/13/12  1544   First MD Initiated Contact with Patient 08/13/12 1616      Chief Complaint  Patient presents with  . Near Syncope    (Consider location/radiation/quality/duration/timing/severity/associated sxs/prior treatment) HPI.... level V caveat for urgent need for intervention.  Patient arrived via EMS.  He was exiting the hot tub at the [facility] when he became syncopal.  He feels lightheaded and dizzy. He slumped to the floor. This is not a typical pattern for him. Glucose was normal. Nothing makes symptoms better or worse. No chest pain, shortness of breath.  Complains of slight extremity weakness both arms and legs  Past Medical History  Diagnosis Date  . BPH (benign prostatic hyperplasia)   . Arthritis   . GERD (gastroesophageal reflux disease)   . Seasonal allergies   . Blood transfusion   . Bilateral cataracts     Past Surgical History  Procedure Laterality Date  . Shoulder surgery  2002    right  . Carpal tunnel release  1990    bilateral  . Cyst on spine  1959    Family History  Problem Relation Age of Onset  . Colon cancer Neg Hx   . Stomach cancer Neg Hx     History  Substance Use Topics  . Smoking status: Former Smoker    Quit date: 10/06/1963  . Smokeless tobacco: Never Used  . Alcohol Use: No      Review of Systems  Unable to perform ROS: Acuity of condition    Allergies  Review of patient's allergies indicates no known allergies.  Home Medications   Current Outpatient Rx  Name  Route  Sig  Dispense  Refill  . aspirin 81 MG tablet   Oral   Take 81 mg by mouth daily.         . celecoxib (CELEBREX) 200 MG capsule   Oral   Take 200 mg by mouth daily.         . diazepam (VALIUM) 5 MG tablet   Oral   Take 1 tablet (5 mg total) by mouth every 6 (six) hours as needed for anxiety.   20 tablet   0   . doxazosin (CARDURA) 8 MG tablet   Oral   Take 8 mg by mouth at bedtime.          Marland Kitchen HYDROcodone-acetaminophen (VICODIN ES) 7.5-750 MG per tablet   Oral   Take 1 tablet by mouth every 6 (six) hours as needed.         Marland Kitchen OVER THE COUNTER MEDICATION   Oral   Take 1 tablet by mouth daily. A joint pill         . pantoprazole (PROTONIX) 40 MG tablet   Oral   Take 40 mg by mouth daily.         Marland Kitchen testosterone (ANDROGEL) 50 MG/5GM GEL   Transdermal   Place 5 g onto the skin daily.         . traMADol (ULTRAM) 50 MG tablet   Oral   Take 50 mg by mouth every 6 (six) hours as needed for pain.           BP 137/100  Pulse 62  Temp(Src) 97.9 F (36.6 C) (Oral)  Resp 13  SpO2 100%  Physical Exam  Nursing note and vitals reviewed. Constitutional: He is oriented to person, place, and time.  Pale, answers questions appropriately, appears sluggish.  Pulse approximately 50 and regular  HENT:  Head: Normocephalic and atraumatic.  Eyes: Conjunctivae and EOM are normal. Pupils are equal, round, and reactive to light.  Neck: Normal range of motion. Neck supple.  Cardiovascular: Normal rate and normal heart sounds.   Bradycardic  Pulmonary/Chest: Effort normal and breath sounds normal.  Abdominal: Soft. Bowel sounds are normal.  Musculoskeletal: Normal range of motion.  Neurological: He is alert and oriented to person, place, and time.  Moving all extremities, but sluggishly  Skin: Skin is warm and dry.  Psychiatric: He has a normal mood and affect.    ED Course  Procedures (including critical care time)  Labs Reviewed  GLUCOSE, CAPILLARY - Abnormal; Notable for the following:    Glucose-Capillary 124 (*)    All other components within normal limits  CBC - Abnormal; Notable for the following:    WBC 3.6 (*)    Hemoglobin 11.0 (*)    HCT 34.1 (*)    All other components within normal limits  BASIC METABOLIC PANEL - Abnormal; Notable for the following:    Glucose, Bld 103 (*)    GFR calc non Af Amer 55 (*)    GFR calc Af Amer 63 (*)    All other  components within normal limits  GLUCOSE, CAPILLARY - Abnormal; Notable for the following:    Glucose-Capillary 105 (*)    All other components within normal limits  GLUCOSE, CAPILLARY - Abnormal; Notable for the following:    Glucose-Capillary 119 (*)    All other components within normal limits  GLUCOSE, CAPILLARY - Abnormal; Notable for the following:    Glucose-Capillary 152 (*)    All other components within normal limits  TSH  TROPONIN I  TROPONIN I  TROPONIN I  HEMOGLOBIN A1C  CBC  COMPREHENSIVE METABOLIC PANEL  LIPID PANEL   Ct Head Wo Contrast  08/13/2012  *RADIOLOGY REPORT*  Clinical Data: Syncope, dizziness  CT HEAD WITHOUT CONTRAST  Technique:  Contiguous axial images were obtained from the base of the skull through the vertex without contrast.  Comparison: None.  Findings: No skull fracture is noted.  Paranasal sinuses and mastoid air cells are unremarkable.  No intracranial hemorrhage, mass effect or midline shift.  No acute infarction.  No mass lesion is noted on this unenhanced scan.  Cerebral atrophy.  Mild periventricular white matter decreased attenuation is probable due to chronic small vessel ischemic changes.  IMPRESSION: No acute intracranial abnormality.  No acute infarction.  Mild cerebral atrophy.  Mild periventricular white matter decreased attenuation probable due to chronic small vessel ischemic changes.   Original Report Authenticated By: Natasha Mead, M.D.      No diagnosis found.   Date: 08/13/2012  Rate: 55  Rhythm: sinus bradycardia  QRS Axis: normal  Intervals: normal  ST/T Wave abnormalities: normal  Conduction Disutrbances:first-degree A-V block   Narrative Interpretation:   Old EKG Reviewed: none available   MDM  Status post syncopal spell.  Patient was initially bradycardic. CT head negative. Admit to observation.        Donnetta Hutching, MD 08/13/12 2015

## 2012-08-13 NOTE — ED Notes (Signed)
Pt reported to RN he "feels funny". Will check CBG

## 2012-08-13 NOTE — ED Notes (Signed)
md at bedside

## 2012-08-13 NOTE — H&P (Signed)
Hospitalist Admission History and Physical  Patient name: Raymond Hopkins Medical record number: [id number] Date of birth: 30-Jun-1934 Age: 78 y.o. Gender: male  Primary Care Provider: Default, Provider, MD  Chief Complaint: Pre-syncope History of Present Illness: This is a 78 year old otherwise healthy and active male who presents today with a presyncopal episode x2. Patient reports that he is at the gym in the hot tub when he tried to get out of heart rate for his towel and had a near syncopal event. Patient denies any loss of consciousness with the episode. Patient states that multiple gym members came to system at the time of the incident. At EMS was called. Patient states yet another episode of presyncopal upon standing on the meds try to help him out. Patient had one episode of nausea/emesis in route to the ER. Patient denies any history of stroke, hypertension, diabetes, MI in the past. Patient denies any chest pain, shortness of breath with the episode. Patient does report having some mild questionable vertiginous symptoms with the first episode. This resolved. Patient states he had a mild episode of this about a year ago. Patient states that he usually plays basketball with 78 year old, with weights, goes to [employer] about 3 times a week. However, patient states a separated shoulder 3-4 weeks ago and has not been as physically active. Patient states he tried these [employer] today became symptomatic after coming out. Patient takes Cardura for BPH. Patient denies any recent increases in this medication. Patient does report being diagnosed with sinusitis earlier in the week and forgot to take his antibiotics for this.   in the ED, patient received one bolus of normal saline. Patient was he felt mildly improved with this. Orthostatics were obtained which were within normal limits. Random blood sugars were obtained that were also within normal limits. Patient did have some heart rates in the 50s and  40s. Patient states that he has chronic bradycardia from his athletic activity. A head CT was also obtained in the ED which showed chronic small vessel ischemic changes however no acute abnormalities.  There is no problem list on file for this patient.  Past Medical History: Past Medical History  Diagnosis Date  . BPH (benign prostatic hyperplasia)   . Arthritis   . GERD (gastroesophageal reflux disease)   . Seasonal allergies   . Blood transfusion   . Bilateral cataracts     Past Surgical History: Past Surgical History  Procedure Laterality Date  . Shoulder surgery  2002    right  . Carpal tunnel release  1990    bilateral  . Cyst on spine  1959    Social History: History   Social History  . Marital Status: Married    Spouse Name: N/A    Number of Children: N/A  . Years of Education: N/A   Social History Main Topics  . Smoking status: Former Smoker    Quit date: 10/06/1963  . Smokeless tobacco: Never Used  . Alcohol Use: No  . Drug Use: No  . Sexually Active: None   Other Topics Concern  . None   Social History Narrative  . None    Family History: Family History  Problem Relation Age of Onset  . Colon cancer Neg Hx   . Stomach cancer Neg Hx     Allergies: No Known Allergies  Current Facility-Administered Medications  Medication Dose Route Frequency Provider Last Rate Last Dose  . 0.9 %  sodium chloride infusion   Intravenous Continuous Doree Albee,  MD      . aspirin EC tablet 325 mg  325 mg Oral Daily Doree Albee, MD      . diazepam (VALIUM) tablet 5 mg  5 mg Oral Q6H PRN Doree Albee, MD      . docusate sodium (COLACE) capsule 100 mg  100 mg Oral BID Doree Albee, MD      . HYDROcodone-acetaminophen (VICODIN) 5-500 MG per tablet 1.5 tablet  1.5 tablet Oral Q6H PRN Doree Albee, MD      . ondansetron [facility]) tablet 4 mg  4 mg Oral Q6H PRN Doree Albee, MD       Or  . ondansetron [facility]) injection 4 mg  4 mg Intravenous Q6H PRN Doree Albee, MD      . pantoprazole (PROTONIX) EC tablet 40 mg  40 mg Oral Daily Doree Albee, MD      . polyethylene glycol (MIRALAX / GLYCOLAX) packet 17 g  17 g Oral Daily PRN Doree Albee, MD      . sodium chloride 0.9 % injection 3 mL  3 mL Intravenous Q12H Doree Albee, MD      . testosterone (ANDROGEL) gel 5 g  5 g Transdermal Daily Doree Albee, MD       Current Outpatient Prescriptions  Medication Sig Dispense Refill  . aspirin 81 MG tablet Take 81 mg by mouth daily.      . celecoxib (CELEBREX) 200 MG capsule Take 200 mg by mouth daily.      . diazepam (VALIUM) 5 MG tablet Take 1 tablet (5 mg total) by mouth every 6 (six) hours as needed for anxiety.  20 tablet  0  . doxazosin (CARDURA) 8 MG tablet Take 8 mg by mouth at bedtime.      Marland Kitchen HYDROcodone-acetaminophen (VICODIN ES) 7.5-750 MG per tablet Take 1 tablet by mouth every 6 (six) hours as needed.      Marland Kitchen OVER THE COUNTER MEDICATION Take 1 tablet by mouth daily. A joint pill      . pantoprazole (PROTONIX) 40 MG tablet Take 40 mg by mouth daily.      Marland Kitchen testosterone (ANDROGEL) 50 MG/5GM GEL Place 5 g onto the skin daily.      . traMADol (ULTRAM) 50 MG tablet Take 50 mg by mouth every 6 (six) hours as needed for pain.       Review Of Systems: 12 point ROS negative except as noted above in HPI.  Physical Exam: Filed Vitals:   08/13/12 1900  BP: 137/100  Pulse: 62  Temp:   Resp: 13    General: alert, cooperative and no distress HEENT: PERRLA, extra ocular movement intact, sclera clear, anicteric and oropharynx clear, no lesions Heart: S1, S2 normal, no murmur, rub or gallop, regular rate and rhythm Lungs: clear to auscultation, no wheezes or rales and unlabored breathing Abdomen: abdomen is soft without significant tenderness, masses, organomegaly or guarding Extremities: extremities normal, atraumatic, no cyanosis or edema Skin:no rashes, no ecchymoses Neurology: normal without focal findings, mental status, speech normal,  alert and oriented x3, PERLA, reflexes normal and symmetric and sensation grossly normal  Labs and Imaging: Lab Results  Component Value Date/Time   NA 138 08/13/2012  4:20 PM   K 3.6 08/13/2012  4:20 PM   CL 104 08/13/2012  4:20 PM   CO2 24 08/13/2012  4:20 PM   BUN 19 08/13/2012  4:20 PM   CREATININE 1.25 08/13/2012  4:20 PM   GLUCOSE 103* 08/13/2012  4:20 PM  Lab Results  Component Value Date   WBC 3.6* 08/13/2012   HGB 11.0* 08/13/2012   HCT 34.1* 08/13/2012   MCV 80.8 08/13/2012   PLT 164 08/13/2012    EKG: 1st degree AV block, sinus bradycardia.  Ct Head Wo Contrast  08/13/2012  *RADIOLOGY REPORT*  Clinical Data: Syncope, dizziness  CT HEAD WITHOUT CONTRAST  Technique:  Contiguous axial images were obtained from the base of the skull through the vertex without contrast.  Comparison: None.  Findings: No skull fracture is noted.  Paranasal sinuses and mastoid air cells are unremarkable.  No intracranial hemorrhage, mass effect or midline shift.  No acute infarction.  No mass lesion is noted on this unenhanced scan.  Cerebral atrophy.  Mild periventricular white matter decreased attenuation is probable due to chronic small vessel ischemic changes.  IMPRESSION: No acute intracranial abnormality.  No acute infarction.  Mild cerebral atrophy.  Mild periventricular white matter decreased attenuation probable due to chronic small vessel ischemic changes.   Original Report Authenticated By: Natasha Mead, M.D.      Assessment and Plan:  Raymond Hopkins is a 78 y.o. year old male presenting with presyncopal event.  Presyncope: History and exam most consistent with vasovagal versus hypovolemic etiology. Will need to rule out neurologic and cardiogenic sources. We'll cycle cardiac enzymes. Gen. risk stratification labs including TSH, A1c, lipid profile. 2-D echo to eval heart function. MRI of the brain without contrast given chronic micro-vessel changes on head CT. Telemetry bed. Full dose aspirin.  Observation. Consider neuro/cardiology consult as clinically indicated.  BPH: Holding Cardura as this may be an offending agent. In and out catheter as needed. Consider starting Flomax at discharge as this is more prostate/outflow tract selective.  Hypogonadism: Continue androgel.   FEN/GI: Advance diet as tolerated. NS @ 75 ccs/hr.  Prophylaxis: Subcutaneous heparin. PPI Disposition: Pending further evaluation. Code Status: Full code.        Doree Albee MD  Pager: [phone]

## 2012-08-13 NOTE — ED Notes (Signed)
Pt to CT

## 2012-08-13 NOTE — ED Notes (Addendum)
ems reports pt was at the [facility] in the hot tub x15 min. Had near syncopal episode, became dizzy, close to passing out, was lowered to ground. When ems arrived pt was laying on ground alert and oriented x4. Upon arising pt became weak, did not LOC, helped onto stretcher. Vomited x1 in route. 20 G IV L hand.  At present alert and oriented x4. Denies pain.  Upon arrival to ED pt placed on bedpan, resting to have bowel movement.  Wife upon arrival reports pt has seemed to not been feeling well for past week

## 2012-08-14 ENCOUNTER — Observation Stay (HOSPITAL_COMMUNITY): Payer: Medicare Other

## 2012-08-14 DIAGNOSIS — N400 Enlarged prostate without lower urinary tract symptoms: Secondary | ICD-10-CM

## 2012-08-14 DIAGNOSIS — R55 Syncope and collapse: Secondary | ICD-10-CM

## 2012-08-14 LAB — TROPONIN I
Troponin I: <0.3 ng/mL (ref <0.30)
Troponin I: <0.3 ng/mL (ref <0.30)

## 2012-08-14 LAB — COMPREHENSIVE METABOLIC PANEL
AST: 24 U/L (ref 0–37)
CO2: 25 mEq/L (ref 19–32)
Calcium: 9.4 mg/dL (ref 8.4–10.5)
Creatinine, Ser: 1.11 mg/dL (ref 0.50–1.35)
GFR calc non Af Amer: 63 mL/min — ABNORMAL LOW (ref >90)
Total Protein: 6.2 g/dL (ref 6.0–8.3)

## 2012-08-14 LAB — CBC
MCH: 26.8 pg (ref 26.0–34.0)
MCHC: 33.3 g/dL (ref 30.0–36.0)
MCV: 80.3 fL (ref 78.0–100.0)
Platelets: 153 10*3/uL (ref 150–400)
RBC: 3.96 MIL/uL — ABNORMAL LOW (ref 4.22–5.81)
RDW: 14.9 % (ref 11.5–15.5)

## 2012-08-14 LAB — TSH: TSH: 3.645 u[IU]/mL (ref 0.350–4.500)

## 2012-08-14 LAB — GLUCOSE, CAPILLARY

## 2012-08-14 MED ORDER — DIAZEPAM 5 MG PO TABS
10.0000 mg | ORAL_TABLET | Freq: Once | ORAL | Status: AC
  Administered 2012-08-14: 10 mg via ORAL
  Filled 2012-08-14: qty 2

## 2012-08-14 MED ORDER — HEPARIN SODIUM (PORCINE) 5000 UNIT/ML IJ SOLN
5000.0000 [IU] | Freq: Three times a day (TID) | INTRAMUSCULAR | Status: DC
  Filled 2012-08-14 (×4): qty 1

## 2012-08-14 MED ORDER — CELECOXIB 200 MG PO CAPS
200.0000 mg | ORAL_CAPSULE | Freq: Every day | ORAL | Status: DC
  Administered 2012-08-14 (×2): 200 mg via ORAL
  Filled 2012-08-14 (×3): qty 1

## 2012-08-14 MED ORDER — SODIUM CHLORIDE 0.9 % IV BOLUS (SEPSIS)
500.0000 mL | Freq: Once | INTRAVENOUS | Status: AC
  Administered 2012-08-14: 500 mL via INTRAVENOUS

## 2012-08-14 NOTE — Progress Notes (Signed)
Nutrition Brief Note  Patient identified on the Malnutrition Screening Tool (MST) Report  Body mass index is 24.06 kg/(m^2). Patient meets criteria for normal weight based on current BMI.   Current diet order is regular, patient working on first meal since admission. Labs and medications reviewed. Pt reports eating well PTA, 2 meals/day of well balanced meals. Pt reports having a "tart" taste for the past year and has been seen by many doctors for this issue with no results. Pt reports recently he gained weight because of an accident causing him to be not as mobile as he used to be but then he lost some weight unintentionally from working out more and eating slightly less. Pt denies any significant changes in weight. Pt not on any nutritional supplements at home. Provided pt with handouts that discuss ways to help with altered taste written by the Academy of [employer]etics and provided RD contact information.   No nutrition interventions warranted at this time. If nutrition issues arise, please consult RD.   Levon Hedger MS, RD, LDN [phone] Pager [phone] After Hours Pager

## 2012-08-14 NOTE — Progress Notes (Signed)
  Echocardiogram 2D Echocardiogram has been performed.  Raymond Hopkins 08/14/2012, 11:11 AM

## 2012-08-14 NOTE — Progress Notes (Signed)
[facility] PROGRESS NOTE  Raymond Hopkins NWG:[id number] DOB: April 14, 1935 DOA: 08/13/2012 PCP: Default, Provider, MD  Assessment/Plan: Presyncope -Check orthostatic vitals, cardiac enzymes so far negative -Await MRI and 2-D echo -Has history of chronic bradycardia-from being athletic per his report, continue monitoring on telemetry BPH -cadura on hold secondary to above, follow History of hypogonadism -Continue AndroGel Code Status: Full code Family Communication: Wife at bedside Disposition Plan: To home when medically ready   Consultants:  none  Procedures:  MRI and echocardiogram pending  Antibiotics:  none  HPI/Subjective: he denies dizziness, chest pain, and no shortness of breath today.  Objective: Filed Vitals:   08/13/12 1900 08/13/12 1954 08/13/12 2020 08/14/12 0624  BP: 137/100  138/76 124/71  Pulse: 62  74 50  Temp:  97.9 F (36.6 C) 98 F (36.7 C) 97.9 F (36.6 C)  TempSrc:   Oral Oral  Resp: 13  18 18   Height:   5\' 8"  (1.727 m)   Weight:   71.759 kg (158 lb 3.2 oz)   SpO2: 100%  100% 100%    Intake/Output Summary (Last 24 hours) at 08/14/12 1041 Last data filed at 08/14/12 5784  Gross per 24 hour  Intake    240 ml  Output      1 ml  Net    239 ml   Filed Weights   08/13/12 2020  Weight: 71.759 kg (158 lb 3.2 oz)    Exam:   General:  Alert and oriented x3, in no apparent distress  Cardiovascular: Bradycardic, regular, normal S1-S2  Respiratory: clear to auscultation bilaterally no crackles or wheezes  Abdomen: Soft, bowel sounds present nontender nondistended no organomegaly and no masses palpable  Extremities: No cyanosis and no edema  Data Reviewed: Basic Metabolic Panel:  Recent Labs Lab 08/13/12 1620 08/14/12 0128  NA 138 136  K 3.6 4.2  CL 104 104  CO2 24 25  GLUCOSE 103* 102*  BUN 19 18  CREATININE 1.25 1.11  CALCIUM 9.7 9.4   Liver Function Tests:  Recent Labs Lab 08/14/12 0128  AST 24  ALT 12   ALKPHOS 62  BILITOT 0.2*  PROT 6.2  ALBUMIN 3.3*   No results found for this basename: LIPASE, AMYLASE,  in the last 168 hours No results found for this basename: AMMONIA,  in the last 168 hours CBC:  Recent Labs Lab 08/13/12 1620 08/14/12 0128  WBC 3.6* 6.6  HGB 11.0* 10.6*  HCT 34.1* 31.8*  MCV 80.8 80.3  PLT 164 153   Cardiac Enzymes:  Recent Labs Lab 08/13/12 1956 08/14/12 0128 08/14/12 0759  TROPONINI <0.30 <0.30 <0.30   BNP (last 3 results) No results found for this basename: PROBNP,  in the last 8760 hours CBG:  Recent Labs Lab 08/13/12 1548 08/13/12 1609 08/13/12 1642 08/13/12 1831  GLUCAP 124* 105* 119* 152*    No results found for this or any previous visit (from the past 240 hour(s)).   Studies: Ct Head Wo Contrast  08/13/2012  *RADIOLOGY REPORT*  Clinical Data: Syncope, dizziness  CT HEAD WITHOUT CONTRAST  Technique:  Contiguous axial images were obtained from the base of the skull through the vertex without contrast.  Comparison: None.  Findings: No skull fracture is noted.  Paranasal sinuses and mastoid air cells are unremarkable.  No intracranial hemorrhage, mass effect or midline shift.  No acute infarction.  No mass lesion is noted on this unenhanced scan.  Cerebral atrophy.  Mild periventricular white matter decreased attenuation is  probable due to chronic small vessel ischemic changes.  IMPRESSION: No acute intracranial abnormality.  No acute infarction.  Mild cerebral atrophy.  Mild periventricular white matter decreased attenuation probable due to chronic small vessel ischemic changes.   Original Report Authenticated By: Natasha Mead, M.D.     Scheduled Meds: . aspirin EC  325 mg Oral Daily  . celecoxib  200 mg Oral QHS  . docusate sodium  100 mg Oral BID  . heparin subcutaneous  5,000 Units Subcutaneous Q8H  . pantoprazole  40 mg Oral Daily  . sodium chloride  3 mL Intravenous Q12H  . testosterone  5 g Transdermal Daily   Continuous  Infusions: . sodium chloride 100 mL/hr at 08/13/12 2232    Active Problems:   * No active hospital problems. *    Time spent: 39    [facility]  [facility] Pager [phone]. If 7PM-7AM, please contact night-coverage at [email or web address], password [facility] 08/14/2012, 10:41 AM  LOS: 1 day

## 2012-08-14 NOTE — Care Management Note (Signed)
    Page 1 of 1   08/14/2012     6:24:48 PM   CARE MANAGEMENT NOTE 08/14/2012  Patient:  Raymond Hopkins, Raymond Hopkins   Account Number:  [id number]  Date Initiated:  08/14/2012  Documentation initiated by:  Colleen Can  Subjective/Objective Assessment:   dx near syncope     Action/Plan:   CM spoke with patient and spouse. Plans are for him to return to his home in [place] where spouse will be caregiver and will have assistance from adult children. Pt has no DME needs or HH needs   Anticipated DC Date:  08/15/2012   Anticipated DC Plan:  HOME/SELF CARE      DC Planning Services  CM consult      Choice offered to / List presented to:             Status of service:  Completed, signed off Medicare Important Message given?   (If response is "NO", the following Medicare IM given date fields will be blank) Date Medicare IM given:   Date Additional Medicare IM given:    Discharge Disposition:    Per UR Regulation:  Reviewed for med. necessity/level of care/duration of stay  If discussed at Long Length of Stay Meetings, dates discussed:    Comments:

## 2012-08-15 DIAGNOSIS — R55 Syncope and collapse: Secondary | ICD-10-CM | POA: Diagnosis present

## 2012-08-15 DIAGNOSIS — N400 Enlarged prostate without lower urinary tract symptoms: Secondary | ICD-10-CM

## 2012-08-15 DIAGNOSIS — K219 Gastro-esophageal reflux disease without esophagitis: Secondary | ICD-10-CM

## 2012-08-15 DIAGNOSIS — M1990 Unspecified osteoarthritis, unspecified site: Secondary | ICD-10-CM

## 2012-08-15 DIAGNOSIS — I951 Orthostatic hypotension: Secondary | ICD-10-CM | POA: Diagnosis present

## 2012-08-15 DIAGNOSIS — M129 Arthropathy, unspecified: Secondary | ICD-10-CM

## 2012-08-15 LAB — GLUCOSE, CAPILLARY: Glucose-Capillary: 103 mg/dL — ABNORMAL HIGH (ref 70–99)

## 2012-08-15 MED ORDER — TAMSULOSIN HCL 0.4 MG PO CAPS: 0.4000 mg | ORAL_CAPSULE | Freq: Every day | ORAL | Status: DC

## 2012-08-15 NOTE — Discharge Summary (Addendum)
Physician Discharge Summary  NOA CONSTANTE ZOX:[id number] DOB: July 25, 1934 DOA: 08/13/2012  PCP: Default, Provider, MD  Admit date: 08/13/2012 Discharge date: 08/15/2012  Time spent: > 35 minutes  Recommendations for Outpatient Follow-up:  1. F/u with urologist in 1-2 weeks or sooner.  Cardura discontinued due to orthostatic hypotension thought to be from this medication.  Discussed with urology prior to discharge and recommended Tamsulosin.  Discharge Diagnoses:  Active Problems: BPH Orthostatic hypotension Arthritis GERD Near syncope  Discharge Condition: Stable  Diet recommendation: low sodium, regular diet  Filed Weights   08/13/12 2020 08/15/12 1147  Weight: 71.759 kg (158 lb 3.2 oz) 74 kg (163 lb 2.3 oz)    History of present illness:  78 y/o CM with PMH of arthritis, GERD, and BPH on cardura that presented with presyncope to the ED.  Hospital Course:  Presyncope  -Check orthostatic vitals, cardiac enzymes negative  - MRI interpreted as no acute intracranial abnormality.  2-D echo with 50-55% EF with normal wall motion per report and no regional wall motion abnormalities. -No red flags reported to me on day of discharge. ADDENDUM: - Patient on day of discharge did show some orthostatic changes with change from laying to sitting but patient reports no dizziness and or near fainting.  I suspect this change was most likely made worse by his cardura.  As such cardura will be discontinued and tamsolusin (flomax) used in its place. CE x 3 negative - no dizziness reported on day of discharge. Patient and family are agreeable with plan and verbalize understanding and agreement to f/u with urologist for BPH and further recommendations.  BPH -cadura on hold secondary to above. -Discussed with Urologist who recommends starting tamsulosin given # 1 on cardura - Will recommend patient follow up with Urologist in 1-2 weeks or sooner should any new concerns arise regarding to his  BPH and or questions regarding tamsulosin  History of hypogonadism  -Continue AndroGel  GERD - stable patient to continue protonix 40 po daily at home.   Procedures:  none  Consultations:  Discussed on phone with Urologist  Discharge Exam: Filed Vitals:   08/15/12 1143 08/15/12 1145 08/15/12 1146 08/15/12 1147  BP: 137/80 162/72 150/74   Pulse: 61 60 64   Temp:      TempSrc:      Resp: 18 18    Height:      Weight:    74 kg (163 lb 2.3 oz)  SpO2: 100% 99% 99%     General: Pt in NAD, Alert and Awake Cardiovascular: RRR, No MRG Respiratory: CTA BL, no wheezes Abdomen: soft, NT, ND  Discharge Instructions  Discharge Orders   Future Orders Complete By Expires     Call MD for:  extreme fatigue  As directed     Call MD for:  persistant dizziness or light-headedness  As directed     Call MD for:  persistant nausea and vomiting  As directed     Call MD for:  severe uncontrolled pain  As directed     Call MD for:  temperature >100.4  As directed     Diet - low sodium heart healthy  As directed     Discharge instructions  As directed     Comments:      Please be sure to follow up with your urologist in 1-2 weeks or sooner should any new concerns arise.    Increase activity slowly  As directed  Medication List    STOP taking these medications       doxazosin 8 MG tablet  Commonly known as:  CARDURA     OVER THE COUNTER MEDICATION     traMADol 50 MG tablet  Commonly known as:  ULTRAM      TAKE these medications       aspirin 81 MG tablet  Take 81 mg by mouth daily.     celecoxib 200 MG capsule  Commonly known as:  CELEBREX  Take 200 mg by mouth daily.     diazepam 5 MG tablet  Commonly known as:  VALIUM  Take 1 tablet (5 mg total) by mouth every 6 (six) hours as needed for anxiety.     HYDROcodone-acetaminophen 7.5-750 MG per tablet  Commonly known as:  VICODIN ES  Take 1 tablet by mouth every 6 (six) hours as needed.     pantoprazole 40 MG  tablet  Commonly known as:  PROTONIX  Take 40 mg by mouth daily.     tamsulosin 0.4 MG Caps  Commonly known as:  FLOMAX  Take 1 capsule (0.4 mg total) by mouth daily.     testosterone 50 MG/5GM Gel  Commonly known as:  ANDROGEL  Place 5 g onto the skin daily.          The results of significant diagnostics from this hospitalization (including imaging, microbiology, ancillary and laboratory) are listed below for reference.    Significant Diagnostic Studies: Ct Head Wo Contrast  08/13/2012  *RADIOLOGY REPORT*  Clinical Data: Syncope, dizziness  CT HEAD WITHOUT CONTRAST  Technique:  Contiguous axial images were obtained from the base of the skull through the vertex without contrast.  Comparison: None.  Findings: No skull fracture is noted.  Paranasal sinuses and mastoid air cells are unremarkable.  No intracranial hemorrhage, mass effect or midline shift.  No acute infarction.  No mass lesion is noted on this unenhanced scan.  Cerebral atrophy.  Mild periventricular white matter decreased attenuation is probable due to chronic small vessel ischemic changes.  IMPRESSION: No acute intracranial abnormality.  No acute infarction.  Mild cerebral atrophy.  Mild periventricular white matter decreased attenuation probable due to chronic small vessel ischemic changes.   Original Report Authenticated By: Natasha Mead, M.D.    Mr Brain Wo Contrast  08/14/2012  *RADIOLOGY REPORT*  Clinical Data: 78 year old male with syncope and dizziness. Weakness.  MRI HEAD WITHOUT CONTRAST  Technique:  Multiplanar, multiecho pulse sequences of the brain and surrounding structures were obtained according to standard protocol without intravenous contrast.  Comparison: Head CT 08/13/2012.  Brain MRI 12/22/2011.  Findings: Stable cerebral volume. No restricted diffusion to suggest acute infarction.  No midline shift, mass effect, evidence of mass lesion, ventriculomegaly, extra-axial collection or acute intracranial hemorrhage.   Cervicomedullary junction and pituitary are within normal limits.  Major intracranial vascular flow voids are stable.  Scattered nonspecific cerebral white matter T2 and FLAIR hyperintensity is mild for age, stable, and nonspecific.  No cortical encephalomalacia.  Negative visualized cervical spine. Visualized bone marrow signal is within normal limits.  Visualized orbit soft tissues are within normal limits.  Interval improved paranasal sinus aeration.  Mild residual sphenoid mucosal thickening.  Mastoids are clear.  Grossly normal visualized internal auditory structures.  Negative scalp soft tissues.  IMPRESSION: No acute intracranial abnormality.  Stable noncontrast MRI appearance of the brain. Mild for age nonspecific cerebral white matter signal changes, most commonly due to chronic small vessel disease.  Original Report Authenticated By: Erskine Speed, M.D.     Microbiology: No results found for this or any previous visit (from the past 240 hour(s)).   Labs: Basic Metabolic Panel:  Recent Labs Lab 08/13/12 1620 08/14/12 0128  NA 138 136  K 3.6 4.2  CL 104 104  CO2 24 25  GLUCOSE 103* 102*  BUN 19 18  CREATININE 1.25 1.11  CALCIUM 9.7 9.4   Liver Function Tests:  Recent Labs Lab 08/14/12 0128  AST 24  ALT 12  ALKPHOS 62  BILITOT 0.2*  PROT 6.2  ALBUMIN 3.3*   No results found for this basename: LIPASE, AMYLASE,  in the last 168 hours No results found for this basename: AMMONIA,  in the last 168 hours CBC:  Recent Labs Lab 08/13/12 1620 08/14/12 0128  WBC 3.6* 6.6  HGB 11.0* 10.6*  HCT 34.1* 31.8*  MCV 80.8 80.3  PLT 164 153   Cardiac Enzymes:  Recent Labs Lab 08/13/12 1956 08/14/12 0128 08/14/12 0759  TROPONINI <0.30 <0.30 <0.30   BNP: BNP (last 3 results) No results found for this basename: PROBNP,  in the last 8760 hours CBG:  Recent Labs Lab 08/13/12 1609 08/13/12 1642 08/13/12 1831 08/14/12 1727 08/15/12 0735  GLUCAP 105* 119* 152* 97 103*        Signed:  Penny Pia  [facility] 08/15/2012, 12:51 PM

## 2012-12-27 ENCOUNTER — Other Ambulatory Visit: Payer: Self-pay

## 2013-03-21 ENCOUNTER — Encounter: Payer: Self-pay | Admitting: Neurology

## 2013-03-21 ENCOUNTER — Other Ambulatory Visit: Payer: Self-pay | Admitting: Neurology

## 2013-03-21 ENCOUNTER — Ambulatory Visit (INDEPENDENT_AMBULATORY_CARE_PROVIDER_SITE_OTHER): Payer: Medicare Other | Admitting: Neurology

## 2013-03-21 VITALS — BP 110/60 | HR 62 | Temp 97.6°F | Ht 67.0 in | Wt 162.0 lb

## 2013-03-21 DIAGNOSIS — R413 Other amnesia: Secondary | ICD-10-CM

## 2013-03-21 NOTE — Patient Instructions (Signed)
You may have mild cognitive impairment, which may turn into dementia. 1.  First, we will check vitamin B12 and thyroid levels. 2.  If labs are normal, we will start Aricept.  It is used to help with memory and slow down potential progression of dementia.  Side effects include nausea, vomiting, diarrhea, vivid dreams, and muscle cramps. 3.  Take care when driving. 4.  Perform brain teasers 5.  Follow up in 6 months.
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
## 2013-03-22 LAB — TSH: TSH: 4.144 u[IU]/mL (ref 0.350–4.500)

## 2013-03-22 LAB — VITAMIN B12: Vitamin B-12: 452 pg/mL (ref 211–911)

## 2013-03-23 ENCOUNTER — Encounter: Payer: Self-pay | Admitting: Neurology

## 2013-03-29 ENCOUNTER — Other Ambulatory Visit: Payer: Self-pay

## 2013-03-30 ENCOUNTER — Encounter: Payer: Self-pay | Admitting: Neurology

## 2013-03-30 ENCOUNTER — Encounter (HOSPITAL_COMMUNITY): Payer: Self-pay | Admitting: Emergency Medicine

## 2013-03-30 ENCOUNTER — Emergency Department (HOSPITAL_COMMUNITY)
Admission: EM | Admit: 2013-03-30 | Discharge: 2013-03-30 | Disposition: A | Discharge status: Home / Self Care | Payer: Medicare Other | Attending: Emergency Medicine | Admitting: Emergency Medicine

## 2013-03-30 DIAGNOSIS — Z791 Long term (current) use of non-steroidal anti-inflammatories (NSAID): Secondary | ICD-10-CM | POA: Insufficient documentation

## 2013-03-30 DIAGNOSIS — Z7982 Long term (current) use of aspirin: Secondary | ICD-10-CM | POA: Insufficient documentation

## 2013-03-30 DIAGNOSIS — Z87891 Personal history of nicotine dependence: Secondary | ICD-10-CM | POA: Insufficient documentation

## 2013-03-30 DIAGNOSIS — N400 Enlarged prostate without lower urinary tract symptoms: Secondary | ICD-10-CM | POA: Insufficient documentation

## 2013-03-30 DIAGNOSIS — M129 Arthropathy, unspecified: Secondary | ICD-10-CM | POA: Insufficient documentation

## 2013-03-30 DIAGNOSIS — R070 Pain in throat: Secondary | ICD-10-CM | POA: Insufficient documentation

## 2013-03-30 DIAGNOSIS — K219 Gastro-esophageal reflux disease without esophagitis: Secondary | ICD-10-CM | POA: Insufficient documentation

## 2013-03-30 DIAGNOSIS — Z79899 Other long term (current) drug therapy: Secondary | ICD-10-CM | POA: Insufficient documentation

## 2013-03-30 DIAGNOSIS — Z8669 Personal history of other diseases of the nervous system and sense organs: Secondary | ICD-10-CM | POA: Insufficient documentation

## 2013-03-30 MED ORDER — GI COCKTAIL ~~LOC~~: 30.0000 mL | Freq: Once | ORAL | Status: DC

## 2013-03-30 MED ORDER — OXYMETAZOLINE HCL 0.05 % NA SOLN: 1.0000 | Freq: Two times a day (BID) | NASAL | Status: DC

## 2013-03-30 MED ORDER — GI COCKTAIL ~~LOC~~
30.0000 mL | Freq: Once | ORAL | Status: AC
  Administered 2013-03-30: 30 mL via ORAL
  Filled 2013-03-30: qty 30

## 2013-03-30 MED ORDER — FLUTICASONE PROPIONATE 50 MCG/ACT NA SUSP: 1.0000 | Freq: Every day | NASAL | Status: DC

## 2013-03-30 NOTE — ED Provider Notes (Signed)
78 y/o male with hx of GERD  Used a flonase nasal spray which was 76 years old, he used this medicine at midnight and at 4 AM he awoke with pain in the throat which was an intense burning pain - he tried to drink milk to get some improvement and now pain is 6/10 from 10/10 initially.  No CP, no SOB, no cough, no vomiting or swelling.  On exam he has a clear OP, no trismus or torticollis, no LAD and supple neck, clear heart and lung sounds, soft abd without epigastric ttp.  No edema of the legs and normal mental status - no rash.  Likely has severe GERD - GI cocktail and ECG pending.  GI cocktail improved pain down to 2/10 - ECG normal, afrin recommended as alternative - encouraged to use PPI bid X 3 days and f/u as needed.  Medical screening examination/treatment/procedure(s) were conducted as a shared visit with non-physician practitioner(s) and myself.  I personally evaluated the patient during the encounter.  Clinical Impression: GERD        Vida Roller, MD 03/30/13 [phone]

## 2013-03-30 NOTE — ED Notes (Addendum)
Pt states he used expired nasal spray this morning at 0000, pt states he woke up at 0400 with an acid like burning sensation in his throat. Pt states his only complaint is a sore throat. Pt states he has been drinking milk to help relieve the acidic feeling. Pt is A&O X4. Pt states he has felt congested the past three nights, pt reports he feels fine during the day.

## 2013-03-30 NOTE — ED Notes (Signed)
Order discontinued as it was a duplicate order.

## 2013-03-30 NOTE — ED Provider Notes (Signed)
CSN: [id number]     Arrival date & time 03/30/13  0501 History   First MD Initiated Contact with Patient 03/30/13 0507     Chief Complaint  Patient presents with  . Sore Throat   (Consider location/radiation/quality/duration/timing/severity/associated sxs/prior Treatment) HPI Comments: The patient is a 78 year-old male with a past medical history of GERD, presenting the Emergency Department with a chief complaint of sore throat.  He reports an abrupt onset of 7/10 burning pain at 0400 this morning. He is concerned it was due to the Flonase that had expired 6 years ago that he used at midnight.  He reports coughing and clearing his throat aggrivates the pain.  Reports that milk decreases the pain by "5%". Reports rhinorrhea. Denies chest discomfort, shortness of breath, upper extremity pain or weakness.  He also denies nausea, vomiting or abdominal pain. The patient denies cough, fever or chills. He reports compliance with his protonix.    The history is provided by the patient.    Past Medical History  Diagnosis Date  . BPH (benign prostatic hyperplasia)   . Arthritis   . GERD (gastroesophageal reflux disease)   . Seasonal allergies   . Blood transfusion   . Bilateral cataracts   . Memory changes    Past Surgical History  Procedure Laterality Date  . Shoulder surgery  2002    right  . Carpal tunnel release  1990    bilateral  . Cyst on spine  1959   Family History  Problem Relation Age of Onset  . Colon cancer Neg Hx   . Stomach cancer Neg Hx   . Dementia Father   . Dementia Brother    History  Substance Use Topics  . Smoking status: Former Smoker    Quit date: 10/06/1963  . Smokeless tobacco: Never Used  . Alcohol Use: No    Review of Systems  All other systems reviewed and are negative.    Allergies  Review of patient's allergies indicates no known allergies.  Home Medications   Current Outpatient Rx  Name  Route  Sig  Dispense  Refill  . aspirin 81 MG  tablet   Oral   Take 81 mg by mouth daily.         . celecoxib (CELEBREX) 200 MG capsule   Oral   Take 200 mg by mouth daily.         Marland Kitchen HYDROcodone-acetaminophen (VICODIN ES) 7.5-750 MG per tablet   Oral   Take 1 tablet by mouth every 6 (six) hours as needed.         . pantoprazole (PROTONIX) 40 MG tablet   Oral   Take 40 mg by mouth daily.         . tamsulosin (FLOMAX) 0.4 MG CAPS capsule   Oral   Take 0.4 mg by mouth 2 (two) times daily.         . tamsulosin (FLOMAX) 0.4 MG CAPS   Oral   Take 1 capsule (0.4 mg total) by mouth daily.   30 capsule   0   . testosterone (ANDROGEL) 50 MG/5GM GEL   Transdermal   Place 5 g onto the skin daily.         . fluticasone (FLONASE) 50 MCG/ACT nasal spray   Each Nare   Place 1 spray into both nostrils daily.   9 g   2   . oxymetazoline (AFRIN NASAL SPRAY) 0.05 % nasal spray   Each Nare   Place  1 spray into both nostrils 2 (two) times daily.   30 mL   0    BP 144/89  Pulse 57  Temp(Src) 98 F (36.7 C) (Oral)  Resp 19  SpO2 97% Physical Exam  Nursing note and vitals reviewed. Constitutional: He is oriented to person, place, and time. He appears well-developed and well-nourished.  HENT:  Head: Normocephalic and atraumatic.  Nose: Rhinorrhea present.  Mouth/Throat: Uvula is midline. Mucous membranes are not dry. No trismus in the jaw. Posterior oropharyngeal erythema present. No oropharyngeal exudate or posterior oropharyngeal edema.  Neck: Normal range of motion. Neck supple.  Cardiovascular: Normal rate and regular rhythm.   Pulmonary/Chest: Effort normal and breath sounds normal. No stridor. He has no wheezes. He has no rales.  Abdominal: Soft. Bowel sounds are normal. He exhibits no distension. There is no tenderness. There is no rebound and no guarding.  Musculoskeletal: He exhibits no edema.  Neurological: He is alert and oriented to person, place, and time.  Skin: Skin is warm and dry.  Psychiatric: He  has a normal mood and affect.    ED Course  Procedures (including critical care time) Labs Review Labs Reviewed - No data to display Imaging Review No results found.  EKG Interpretation     Ventricular Rate:  56 PR Interval:  243 QRS Duration: 80 QT Interval:  408 QTC Calculation: 394 R Axis:   66 Text Interpretation:  Sinus bradycardia Prolonged PR interval ECG OTHERWISE WITHIN NORMAL LIMITS since last tracing no significant change            MDM   1. Acid reflux   2. Throat pain    Pt with a history of GERD presenting with burning sensation to his throat.  PE reveals abdomen is soft and non tender.  Heart and lungs clear bilaterally. Will order a GI cocktail and re-evaluate.  1610 The patient reports pain is improving. But still 5-6/10.  Discussed patent condition with Dr. Hyacinth Meeker who after evaluation agrees on treatment plan, patient reports pain has resolved.   Meds given in ED:  Medications  gi cocktail (Maalox,Lidocaine,Donnatal) (30 mLs Oral Given 03/30/13 0556)    Discharge Medication List as of 03/30/2013  6:31 AM    START taking these medications   Details  fluticasone (FLONASE) 50 MCG/ACT nasal spray Place 1 spray into both nostrils daily., Starting 03/30/2013, Until Discontinued, Print    oxymetazoline (AFRIN NASAL SPRAY) 0.05 % nasal spray Place 1 spray into both nostrils 2 (two) times daily., Starting 03/30/2013, Until Discontinued, Print          Clabe Seal, PA-C 04/01/13 [phone]

## 2013-03-30 NOTE — ED Notes (Signed)
MD at bedside. 

## 2013-04-01 NOTE — ED Provider Notes (Signed)
Medical screening examination/treatment/procedure(s) were conducted as a shared visit with non-physician practitioner(s) and myself.  I personally evaluated the patient during the encounter  Please see my separate respective documentation pertaining to this patient encounter   Vida Roller, MD 04/01/13 2314

## 2014-03-08 ENCOUNTER — Other Ambulatory Visit: Payer: Self-pay

## 2014-03-13 ENCOUNTER — Ambulatory Visit: Payer: Medicare Other | Admitting: Neurology

## 2014-03-27 ENCOUNTER — Encounter: Payer: Self-pay | Admitting: Neurology

## 2014-03-27 ENCOUNTER — Ambulatory Visit (INDEPENDENT_AMBULATORY_CARE_PROVIDER_SITE_OTHER): Payer: Commercial Managed Care - HMO | Admitting: Neurology

## 2014-03-27 VITALS — BP 118/64 | HR 70 | Resp 18 | Wt 168.7 lb

## 2014-03-27 DIAGNOSIS — G3184 Mild cognitive impairment, so stated: Secondary | ICD-10-CM

## 2014-03-27 MED ORDER — DONEPEZIL HCL 5 MG PO TABS: 5.0000 mg | ORAL_TABLET | Freq: Every day | ORAL | Status: DC

## 2014-03-27 NOTE — Patient Instructions (Signed)
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    We will start donepezil (Aricept) 5mg  daily for four weeks.  If you are tolerating the medication, then after four weeks, we will increase the dose to 10mg  daily.  Side effects include nausea, vomiting, diarrhea, vivid dreams, and muscle cramps.  Please call the clinic if you experience any of these symptoms.  Follow up in 6 months.
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
## 2014-03-28 NOTE — Addendum Note (Signed)
Addended by: Charyl Bigger E on: 03/28/2014 08:24 AM   Modules accepted: Medications

## 2014-05-01 ENCOUNTER — Encounter: Payer: Self-pay | Admitting: Neurology

## 2014-05-15 ENCOUNTER — Telehealth: Payer: Self-pay | Admitting: *Deleted

## 2014-05-15 NOTE — Telephone Encounter (Signed)
-----   Message from Dudley Major, DO sent at 05/15/2014  7:32 AM EST ----- Raymond Hopkins,  There are two other medications we can try, namely rivastigmine and galantamine. They are in the same family as Aricept but perhaps you would tolerate one of those options better. Please let me know if you wish to try one. They are both pills. Rivastigmine comes in a patch form as well.   Dr. Napoleon Form,  I answered this patient's MyChart message but it appears she never read it. Previous Messages    ----- Message -----   From: Rene Paci   Sent: 05/01/2014 3:51 PM EST    To: JAFFE, ADAM ROBERT, DO  Subject: Visit Follow-Up Question   I tried the Donepezil (Aricept) 5mg  from 03/27/14 - 04/23/14. I cannot deal with the side effects. I experienced extreme muscle cramping, loose bowels, low energy, and general fatigue. I do not plan to continue this medication and will just have to accept the negative impact on my memory.   Raymond Hopkins

## 2014-05-15 NOTE — Telephone Encounter (Signed)
I spoke with the pateint regarding new medication s rivastigmine and galantamine they will discuss and let us know after the [facility]

## 2014-05-27 ENCOUNTER — Telehealth: Payer: Self-pay | Admitting: Neurology

## 2014-05-27 MED ORDER — GALANTAMINE HYDROBROMIDE 4 MG PO TABS: ORAL_TABLET | ORAL | Status: DC

## 2014-05-27 NOTE — Telephone Encounter (Signed)
Discussed medication options with patient and his wife.  Will try galantamine 4mg  twice daily for 4 weeks.  Discussed side effects.  Should call in 4 weeks with update and we can increase dose if tolerating.

## 2014-05-29 DIAGNOSIS — E291 Testicular hypofunction: Secondary | ICD-10-CM | POA: Diagnosis not present

## 2014-05-29 DIAGNOSIS — R972 Elevated prostate specific antigen [PSA]: Secondary | ICD-10-CM | POA: Diagnosis not present

## 2014-05-29 DIAGNOSIS — N5201 Erectile dysfunction due to arterial insufficiency: Secondary | ICD-10-CM | POA: Diagnosis not present

## 2014-05-31 ENCOUNTER — Telehealth: Payer: Self-pay | Admitting: *Deleted

## 2014-05-31 NOTE — Telephone Encounter (Signed)
CALLING  ABOUT NEW MEDICATION OTHER THAN ARICEPT i EXPLAINED TO PHARMACY THE PATIENT WAS TO CALL  AFTER THE HOLIDAYS AND LET us [place] WHICH ONE THEY WISH TO TAKE WE HAD A PHONE CONVERSATION ON 05/15/14 ABOUT THIS

## 2014-06-03 ENCOUNTER — Encounter: Payer: Self-pay | Admitting: *Deleted

## 2014-06-03 ENCOUNTER — Telehealth: Payer: Self-pay | Admitting: *Deleted

## 2014-06-03 ENCOUNTER — Telehealth: Payer: Self-pay | Admitting: Neurology

## 2014-06-03 NOTE — Telephone Encounter (Signed)
Medication was called to [place]

## 2014-06-03 NOTE — Telephone Encounter (Signed)
Pt wife Olegario Shearer called and states that she needs talk to someone about medication pt is to try please call [phone]

## 2014-06-04 DIAGNOSIS — Z029 Encounter for administrative examinations, unspecified: Secondary | ICD-10-CM

## 2014-09-25 ENCOUNTER — Ambulatory Visit: Payer: Medicare Other | Admitting: Neurology

## 2014-10-16 DIAGNOSIS — H5210 Myopia, unspecified eye: Secondary | ICD-10-CM | POA: Diagnosis not present

## 2014-10-16 DIAGNOSIS — H5200 Hypermetropia, unspecified eye: Secondary | ICD-10-CM | POA: Diagnosis not present

## 2014-10-22 DIAGNOSIS — N5201 Erectile dysfunction due to arterial insufficiency: Secondary | ICD-10-CM | POA: Diagnosis not present

## 2014-10-22 DIAGNOSIS — E291 Testicular hypofunction: Secondary | ICD-10-CM | POA: Diagnosis not present

## 2014-10-29 ENCOUNTER — Ambulatory Visit (INDEPENDENT_AMBULATORY_CARE_PROVIDER_SITE_OTHER): Payer: Commercial Managed Care - HMO | Admitting: Neurology

## 2014-10-29 ENCOUNTER — Encounter: Payer: Self-pay | Admitting: Neurology

## 2014-10-29 VITALS — BP 126/60 | HR 57 | Ht 68.0 in | Wt 166.9 lb

## 2014-10-29 DIAGNOSIS — G3184 Mild cognitive impairment, so stated: Secondary | ICD-10-CM | POA: Diagnosis not present
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
## 2014-10-29 NOTE — Patient Instructions (Addendum)
You are doing well.  Follow up in one year or sooner if needed.  Another medication for memory to consider would be rivastigmine (Exelon).

## 2014-11-18 ENCOUNTER — Other Ambulatory Visit: Payer: Self-pay

## 2014-11-22 ENCOUNTER — Encounter: Payer: Self-pay | Admitting: Gastroenterology

## 2015-01-02 ENCOUNTER — Emergency Department (HOSPITAL_COMMUNITY)
Admission: EM | Admit: 2015-01-02 | Discharge: 2015-01-02 | Disposition: A | Discharge status: Home / Self Care | Payer: Commercial Managed Care - HMO | Attending: Emergency Medicine | Admitting: Emergency Medicine

## 2015-01-02 ENCOUNTER — Encounter (HOSPITAL_COMMUNITY): Payer: Self-pay | Admitting: *Deleted

## 2015-01-02 ENCOUNTER — Emergency Department (HOSPITAL_COMMUNITY): Payer: Commercial Managed Care - HMO

## 2015-01-02 DIAGNOSIS — Z7982 Long term (current) use of aspirin: Secondary | ICD-10-CM | POA: Insufficient documentation

## 2015-01-02 DIAGNOSIS — I82401 Acute embolism and thrombosis of unspecified deep veins of right lower extremity: Secondary | ICD-10-CM | POA: Diagnosis not present

## 2015-01-02 DIAGNOSIS — M1990 Unspecified osteoarthritis, unspecified site: Secondary | ICD-10-CM | POA: Diagnosis not present

## 2015-01-02 DIAGNOSIS — Z79899 Other long term (current) drug therapy: Secondary | ICD-10-CM | POA: Insufficient documentation

## 2015-01-02 DIAGNOSIS — H269 Unspecified cataract: Secondary | ICD-10-CM | POA: Insufficient documentation

## 2015-01-02 DIAGNOSIS — I824Z1 Acute embolism and thrombosis of unspecified deep veins of right distal lower extremity: Secondary | ICD-10-CM | POA: Diagnosis not present

## 2015-01-02 DIAGNOSIS — K219 Gastro-esophageal reflux disease without esophagitis: Secondary | ICD-10-CM | POA: Insufficient documentation

## 2015-01-02 DIAGNOSIS — R2241 Localized swelling, mass and lump, right lower limb: Secondary | ICD-10-CM | POA: Diagnosis present

## 2015-01-02 DIAGNOSIS — Z87891 Personal history of nicotine dependence: Secondary | ICD-10-CM | POA: Diagnosis not present

## 2015-01-02 DIAGNOSIS — M7989 Other specified soft tissue disorders: Secondary | ICD-10-CM

## 2015-01-02 LAB — CBC WITH DIFFERENTIAL/PLATELET
BASOS PCT: 0 % (ref 0–1)
Basophils Absolute: 0 10*3/uL (ref 0.0–0.1)
EOS ABS: 0.2 10*3/uL (ref 0.0–0.7)
Eosinophils Relative: 4 % (ref 0–5)
HEMATOCRIT: 39.8 % (ref 39.0–52.0)
HEMOGLOBIN: 13.6 g/dL (ref 13.0–17.0)
LYMPHS ABS: 1.3 10*3/uL (ref 0.7–4.0)
Lymphocytes Relative: 27 % (ref 12–46)
MCH: 31.8 pg (ref 26.0–34.0)
MCHC: 34.2 g/dL (ref 30.0–36.0)
MCV: 93 fL (ref 78.0–100.0)
MONOS PCT: 10 % (ref 3–12)
Monocytes Absolute: 0.5 10*3/uL (ref 0.1–1.0)
NEUTROS ABS: 2.9 10*3/uL (ref 1.7–7.7)
Neutrophils Relative %: 59 % (ref 43–77)
Platelets: 137 10*3/uL — ABNORMAL LOW (ref 150–400)
RBC: 4.28 MIL/uL (ref 4.22–5.81)
RDW: 13 % (ref 11.5–15.5)
WBC: 5 10*3/uL (ref 4.0–10.5)

## 2015-01-02 LAB — BASIC METABOLIC PANEL
Anion gap: 5 (ref 5–15)
BUN: 18 mg/dL (ref 6–20)
CALCIUM: 9.8 mg/dL (ref 8.9–10.3)
CO2: 28 mmol/L (ref 22–32)
CREATININE: 1.26 mg/dL — AB (ref 0.61–1.24)
Chloride: 105 mmol/L (ref 101–111)
GFR calc Af Amer: >60 mL/min (ref >60)
GFR, EST NON AFRICAN AMERICAN: 53 mL/min — AB (ref >60)
Glucose, Bld: 116 mg/dL — ABNORMAL HIGH (ref 65–99)
POTASSIUM: 4.8 mmol/L (ref 3.5–5.1)
Sodium: 138 mmol/L (ref 135–145)

## 2015-01-02 LAB — ANTITHROMBIN III: AntiThromb III Func: 96 % (ref 75–120)

## 2015-01-02 MED ORDER — XARELTO VTE STARTER PACK 15 & 20 MG PO TBPK: 15.0000 mg | ORAL_TABLET | ORAL | Status: DC

## 2015-01-02 NOTE — ED Notes (Signed)
Dr. Alvino Chapel notified pt is positive for DVT per ultrasound.

## 2015-01-02 NOTE — ED Notes (Signed)
Patient transported to Ultrasound 

## 2015-01-02 NOTE — ED Notes (Signed)
Pt is in stable condition upon d/c and ambulates from ED with family.

## 2015-01-02 NOTE — ED Notes (Signed)
Called phlebotomy to draw pt blood.

## 2015-01-02 NOTE — ED Notes (Signed)
Pt reports right lower leg being red and swollen, getting progressively worse over several weeks.

## 2015-01-02 NOTE — Progress Notes (Signed)
Preliminary report by tech - Right upper extremity venous duplex completed. No evidence of deep or superficial vein thrombosis.  Oda Cogan, BS, RDMS, RVT

## 2015-01-02 NOTE — ED Provider Notes (Signed)
CSN: [id number]     Arrival date & time 01/02/15  1146 History   First MD Initiated Contact with Patient 01/02/15 1333     Chief Complaint  Patient presents with  . Leg Swelling     (Consider location/radiation/quality/duration/timing/severity/associated sxs/prior Treatment) The history is provided by the patient.   patient with swelling in his right leg and right hand. He's had for last few weeks worse last few days. No trauma. States he previously played basketball 3 times a week but has recently been playing only once we. No chest pain or trouble breathing. No fevers. No recent travel. He does not smoke. No family history of blood clots. Sent in by his primary care doctor. No history of cancer, however his PSA has been chronically elevated.  Past Medical History  Diagnosis Date  . BPH (benign prostatic hyperplasia)   . Arthritis   . GERD (gastroesophageal reflux disease)   . Seasonal allergies   . Blood transfusion   . Bilateral cataracts   . Memory changes    Past Surgical History  Procedure Laterality Date  . Shoulder surgery  2002    right  . Carpal tunnel release  1990    bilateral  . Cyst on spine  1959   Family History  Problem Relation Age of Onset  . Colon cancer Neg Hx   . Stomach cancer Neg Hx   . Dementia Father   . Dementia Brother    Social History  Substance Use Topics  . Smoking status: Former Smoker    Quit date: 10/06/1963  . Smokeless tobacco: Never Used  . Alcohol Use: No    Review of Systems  Constitutional: Negative for appetite change.  Eyes: Negative for redness.  Respiratory: Negative for cough and shortness of breath.   Cardiovascular: Positive for leg swelling.  Gastrointestinal: Negative for abdominal pain.  Genitourinary: Negative for flank pain.  Musculoskeletal: Negative for back pain.  Skin: Positive for color change. Negative for wound.  Neurological: Negative for weakness and numbness.  Hematological: Negative for adenopathy.       Allergies  Review of patient's allergies indicates no known allergies.  Home Medications   Prior to Admission medications   Medication Sig Start Date End Date Taking? Authorizing Provider  aspirin 81 MG tablet Take 81 mg by mouth daily.   Yes Historical Provider, MD  cholecalciferol (VITAMIN D) 1000 UNITS tablet Take 1,000 Units by mouth daily.   Yes Historical Provider, MD  ferrous sulfate 325 (65 FE) MG tablet Take 325 mg by mouth daily with breakfast.   Yes Historical Provider, MD  HYDROcodone-acetaminophen (VICODIN ES) 7.5-750 MG per tablet Take 1 tablet by mouth every 6 (six) hours as needed.   Yes Historical Provider, MD  magnesium gluconate (MAGONATE) 500 MG tablet Take 500 mg by mouth daily.   Yes Historical Provider, MD  OVER THE COUNTER MEDICATION Take 1 Dose by mouth daily. Prostangin TriMix   Yes Historical Provider, MD  pantoprazole (PROTONIX) 40 MG tablet Take 40 mg by mouth daily.   Yes Historical Provider, MD  tadalafil (CIALIS) 10 MG tablet Take 10 mg by mouth daily as needed for erectile dysfunction.   Yes Historical Provider, MD  tamsulosin (FLOMAX) 0.4 MG CAPS Take 1 capsule (0.4 mg total) by mouth daily. Patient taking differently: Take 0.4 mg by mouth 2 (two) times daily.  08/15/12  Yes Velvet Bathe, MD  testosterone (ANDROGEL) 50 MG/5GM GEL Place 5 g onto the skin daily.   Yes  Historical Provider, MD  traMADol (ULTRAM) 50 MG tablet Take 50 mg by mouth every 8 (eight) hours as needed for moderate pain.   Yes Historical Provider, MD  vitamin B-12 (CYANOCOBALAMIN) 500 MCG tablet Take 500 mcg by mouth daily. BID   Yes Historical Provider, MD  fluticasone (FLONASE) 50 MCG/ACT nasal spray Place 1 spray into both nostrils daily. Patient not taking: Reported on 01/02/2015 03/30/13   Noemi Chapel, MD  galantamine (RAZADYNE) 4 MG tablet Take 1tab BID.  Call office in 4 weeks for refill. Patient not taking: Reported on 01/02/2015 05/27/14   Pieter Partridge, DO  oxymetazoline  (AFRIN NASAL SPRAY) 0.05 % nasal spray Place 1 spray into both nostrils 2 (two) times daily. Patient not taking: Reported on 01/02/2015 03/30/13   Noemi Chapel, MD  XARELTO STARTER PACK 15 & 20 MG TBPK Take 15-20 mg by mouth as directed. Take as directed on package: Start with one 15mg  tablet by mouth twice a day with food. On Day 22, switch to one 20mg  tablet once a day with food. 01/02/15   Davonna Belling, MD   BP 135/73 mmHg  Pulse 52  Temp(Src) 97.4 F (36.3 C) (Oral)  Resp 18  SpO2 94% Physical Exam  Constitutional: He appears well-developed and well-nourished.  Cardiovascular: Normal rate and regular rhythm.   Pulmonary/Chest: Effort normal and breath sounds normal.  Abdominal: Soft. Bowel sounds are normal.  Musculoskeletal: He exhibits edema.  Some mild swelling palm of right hand. Also some edema in the right lower leg. Mild erythema right lower leg strong dorsalis pedis pulse. No extra warmth in the extremity.  Neurological: He is alert.  Skin: Skin is warm.    ED Course  Procedures (including critical care time) Labs Review Labs Reviewed  CBC WITH DIFFERENTIAL/PLATELET - Abnormal; Notable for the following:    Platelets 137 (*)    All other components within normal limits  BASIC METABOLIC PANEL - Abnormal; Notable for the following:    Glucose, Bld 116 (*)    Creatinine, Ser 1.26 (*)    GFR calc non Af Amer 53 (*)    All other components within normal limits  ANTITHROMBIN III  PROTEIN C ACTIVITY  PROTEIN C, TOTAL  PROTEIN S ACTIVITY  PROTEIN S, TOTAL  LUPUS ANTICOAGULANT PANEL  BETA-2-GLYCOPROTEIN I ABS, IGG/M/A  HOMOCYSTEINE  FACTOR 5 LEIDEN  PROTHROMBIN GENE MUTATION  CARDIOLIPIN ANTIBODIES, IGG, IGM, IGA    Imaging Review No results found. I, Davonna Belling R., personally reviewed and evaluated these images and lab results as part of my medical decision-making.   EKG Interpretation None      MDM   Final diagnoses:  DVT (deep venous thrombosis),  right    Patient with DVT in right lower extremity. No signs of pulmonary embolism. Will start on Xarelto. Have ordered hypercoagulable panel. Will follow-up with his PCP. Discussed with the patient's son, who is a Software engineer.    Davonna Belling, MD 01/02/15 [phone]

## 2015-01-02 NOTE — Progress Notes (Signed)
VASCULAR LAB PRELIMINARY  PRELIMINARY  PRELIMINARY  PRELIMINARY  Right Lower Ext. Venous Duplex Completed. Positive for acute deep vein thrombosis involving the right common femoral vein and posterior tibial and peroneal veins. There is also superficial vein thrombosis in the proximal great saphenous vein. Results given to patient's nurse.  Alla German, RVT 01/02/2015, 3:40 PM

## 2015-01-02 NOTE — Discharge Instructions (Signed)

## 2015-01-03 LAB — CARDIOLIPIN ANTIBODIES, IGG, IGM, IGA
Anticardiolipin IgG: <9 GPL U/mL (ref 0–14)
Anticardiolipin IgM: <9 MPL U/mL (ref 0–12)

## 2015-01-03 LAB — HOMOCYSTEINE: Homocysteine: 9.3 umol/L (ref 0.0–15.0)

## 2015-01-04 LAB — BETA-2-GLYCOPROTEIN I ABS, IGG/M/A
BETA-2-GLYCOPROTEIN I IGA: 11 GPI IgA units (ref 0–25)
Beta-2 Glyco I IgG: <9 GPI IgG units (ref 0–20)
Beta-2-Glycoprotein I IgM: <9 GPI IgM units (ref 0–32)

## 2015-01-04 LAB — PROTEIN C, TOTAL: Protein C, Total: 93 % (ref 70–140)

## 2015-01-05 LAB — PROTEIN S, TOTAL: PROTEIN S AG TOTAL: 119 % (ref 58–150)

## 2015-01-05 LAB — LUPUS ANTICOAGULANT PANEL
DRVVT: 34.3 s (ref 0.0–55.1)
PTT LA: 32.8 s (ref 0.0–50.0)

## 2015-01-05 LAB — PROTEIN S ACTIVITY: Protein S Activity: 93 % (ref 60–145)

## 2015-01-05 LAB — PROTEIN C ACTIVITY: Protein C Activity: 132 % (ref 74–151)

## 2015-01-06 LAB — PROTHROMBIN GENE MUTATION

## 2015-01-06 LAB — FACTOR 5 LEIDEN

## 2015-01-09 DIAGNOSIS — R739 Hyperglycemia, unspecified: Secondary | ICD-10-CM | POA: Diagnosis not present

## 2015-01-09 DIAGNOSIS — R350 Frequency of micturition: Secondary | ICD-10-CM | POA: Diagnosis not present

## 2015-01-09 DIAGNOSIS — R972 Elevated prostate specific antigen [PSA]: Secondary | ICD-10-CM | POA: Diagnosis not present

## 2015-01-09 DIAGNOSIS — I82401 Acute embolism and thrombosis of unspecified deep veins of right lower extremity: Secondary | ICD-10-CM | POA: Diagnosis not present

## 2015-01-09 DIAGNOSIS — Z7901 Long term (current) use of anticoagulants: Secondary | ICD-10-CM | POA: Diagnosis not present

## 2015-01-15 DIAGNOSIS — S61219A Laceration without foreign body of unspecified finger without damage to nail, initial encounter: Secondary | ICD-10-CM | POA: Diagnosis not present

## 2015-01-17 DIAGNOSIS — R3915 Urgency of urination: Secondary | ICD-10-CM | POA: Diagnosis not present

## 2015-01-22 ENCOUNTER — Emergency Department (HOSPITAL_COMMUNITY): Payer: Commercial Managed Care - HMO

## 2015-01-22 ENCOUNTER — Emergency Department (HOSPITAL_COMMUNITY)
Admission: EM | Admit: 2015-01-22 | Discharge: 2015-01-22 | Disposition: A | Discharge status: Home / Self Care | Payer: Commercial Managed Care - HMO | Attending: Emergency Medicine | Admitting: Emergency Medicine

## 2015-01-22 ENCOUNTER — Encounter (HOSPITAL_COMMUNITY): Payer: Self-pay | Admitting: *Deleted

## 2015-01-22 ENCOUNTER — Emergency Department (HOSPITAL_COMMUNITY)
Admission: EM | Admit: 2015-01-22 | Discharge: 2015-01-22 | Disposition: A | Discharge status: Other Acute Inpatient Hospital | Payer: Commercial Managed Care - HMO | Source: Home / Self Care | Attending: Family Medicine | Admitting: Family Medicine

## 2015-01-22 ENCOUNTER — Encounter (HOSPITAL_COMMUNITY): Payer: Self-pay | Admitting: Family Medicine

## 2015-01-22 DIAGNOSIS — I2699 Other pulmonary embolism without acute cor pulmonale: Secondary | ICD-10-CM

## 2015-01-22 DIAGNOSIS — Z8669 Personal history of other diseases of the nervous system and sense organs: Secondary | ICD-10-CM | POA: Insufficient documentation

## 2015-01-22 DIAGNOSIS — Z7951 Long term (current) use of inhaled steroids: Secondary | ICD-10-CM | POA: Insufficient documentation

## 2015-01-22 DIAGNOSIS — R0602 Shortness of breath: Secondary | ICD-10-CM | POA: Diagnosis not present

## 2015-01-22 DIAGNOSIS — R079 Chest pain, unspecified: Secondary | ICD-10-CM | POA: Insufficient documentation

## 2015-01-22 DIAGNOSIS — Z87891 Personal history of nicotine dependence: Secondary | ICD-10-CM | POA: Insufficient documentation

## 2015-01-22 DIAGNOSIS — Z8719 Personal history of other diseases of the digestive system: Secondary | ICD-10-CM | POA: Insufficient documentation

## 2015-01-22 DIAGNOSIS — R2240 Localized swelling, mass and lump, unspecified lower limb: Secondary | ICD-10-CM | POA: Diagnosis not present

## 2015-01-22 DIAGNOSIS — I440 Atrioventricular block, first degree: Secondary | ICD-10-CM | POA: Diagnosis not present

## 2015-01-22 DIAGNOSIS — Z7901 Long term (current) use of anticoagulants: Secondary | ICD-10-CM | POA: Diagnosis not present

## 2015-01-22 DIAGNOSIS — R0789 Other chest pain: Secondary | ICD-10-CM | POA: Diagnosis not present

## 2015-01-22 DIAGNOSIS — M1990 Unspecified osteoarthritis, unspecified site: Secondary | ICD-10-CM | POA: Diagnosis not present

## 2015-01-22 DIAGNOSIS — I517 Cardiomegaly: Secondary | ICD-10-CM | POA: Diagnosis not present

## 2015-01-22 DIAGNOSIS — N400 Enlarged prostate without lower urinary tract symptoms: Secondary | ICD-10-CM | POA: Diagnosis not present

## 2015-01-22 DIAGNOSIS — I2510 Atherosclerotic heart disease of native coronary artery without angina pectoris: Secondary | ICD-10-CM | POA: Diagnosis not present

## 2015-01-22 DIAGNOSIS — K449 Diaphragmatic hernia without obstruction or gangrene: Secondary | ICD-10-CM | POA: Diagnosis not present

## 2015-01-22 DIAGNOSIS — Z79899 Other long term (current) drug therapy: Secondary | ICD-10-CM | POA: Insufficient documentation

## 2015-01-22 DIAGNOSIS — M7989 Other specified soft tissue disorders: Secondary | ICD-10-CM | POA: Diagnosis not present

## 2015-01-22 LAB — BASIC METABOLIC PANEL
ANION GAP: 7 (ref 5–15)
BUN: 17 mg/dL (ref 6–20)
CHLORIDE: 104 mmol/L (ref 101–111)
CO2: 27 mmol/L (ref 22–32)
Calcium: 10.4 mg/dL — ABNORMAL HIGH (ref 8.9–10.3)
Creatinine, Ser: 1.25 mg/dL — ABNORMAL HIGH (ref 0.61–1.24)
GFR, EST NON AFRICAN AMERICAN: 53 mL/min — AB (ref >60)
Glucose, Bld: 96 mg/dL (ref 65–99)
POTASSIUM: 4.4 mmol/L (ref 3.5–5.1)
SODIUM: 138 mmol/L (ref 135–145)

## 2015-01-22 LAB — CBC
HEMATOCRIT: 43.2 % (ref 39.0–52.0)
Hemoglobin: 14.9 g/dL (ref 13.0–17.0)
MCH: 32 pg (ref 26.0–34.0)
MCHC: 34.5 g/dL (ref 30.0–36.0)
MCV: 92.9 fL (ref 78.0–100.0)
Platelets: 185 10*3/uL (ref 150–400)
RBC: 4.65 MIL/uL (ref 4.22–5.81)
RDW: 12.9 % (ref 11.5–15.5)
WBC: 4.9 10*3/uL (ref 4.0–10.5)

## 2015-01-22 LAB — I-STAT TROPONIN, ED
TROPONIN I, POC: 0 ng/mL (ref 0.00–0.08)
TROPONIN I, POC: 0 ng/mL (ref 0.00–0.08)

## 2015-01-22 LAB — BRAIN NATRIURETIC PEPTIDE: B Natriuretic Peptide: 36.2 pg/mL (ref 0.0–100.0)

## 2015-01-22 MED ORDER — SODIUM CHLORIDE 0.9 % IV BOLUS (SEPSIS)
1000.0000 mL | Freq: Once | INTRAVENOUS | Status: AC
  Administered 2015-01-22: 1000 mL via INTRAVENOUS

## 2015-01-22 MED ORDER — IOHEXOL 350 MG/ML SOLN
80.0000 mL | Freq: Once | INTRAVENOUS | Status: AC | PRN
  Administered 2015-01-22: 100 mL via INTRAVENOUS

## 2015-01-22 NOTE — ED Provider Notes (Signed)
CSN: [id number]     Arrival date & time 01/22/15  1622 History   First MD Initiated Contact with Patient 01/22/15 1724     Chief Complaint  Patient presents with  . Chest Pain  . Shortness of Breath     (Consider location/radiation/quality/duration/timing/severity/associated sxs/prior Treatment) Patient is a 80 y.o. male presenting with chest pain.  Chest Pain Pain location:  L chest and R chest Pain quality: pressure   Pain radiates to:  Does not radiate Pain radiates to the back: no   Pain severity:  Moderate Onset quality:  Sudden Duration:  3 hours Timing:  Constant Progression:  Improving Chronicity:  New Context: breathing   Relieved by:  None tried Worsened by:  Deep breathing Ineffective treatments:  None tried Associated symptoms: shortness of breath   Associated symptoms: no abdominal pain, no cough, no diaphoresis, no fever, no headache, no nausea and not vomiting   Risk factors: prior DVT/PE   Risk factors: no coronary artery disease, no diabetes mellitus, no high cholesterol and no hypertension     Past Medical History  Diagnosis Date  . BPH (benign prostatic hyperplasia)   . Arthritis   . GERD (gastroesophageal reflux disease)   . Seasonal allergies   . Blood transfusion   . Bilateral cataracts   . Memory changes    Past Surgical History  Procedure Laterality Date  . Shoulder surgery  2002    right  . Carpal tunnel release  1990    bilateral  . Cyst on spine  1959   Family History  Problem Relation Age of Onset  . Colon cancer Neg Hx   . Stomach cancer Neg Hx   . Dementia Father   . Dementia Brother    Social History  Substance Use Topics  . Smoking status: Former Smoker    Quit date: 10/06/1963  . Smokeless tobacco: Never Used  . Alcohol Use: No    Review of Systems  Constitutional: Negative for fever, chills and diaphoresis.  HENT: Negative for congestion and sore throat.   Eyes: Negative for visual disturbance.  Respiratory:  Positive for shortness of breath. Negative for cough and wheezing.   Cardiovascular: Positive for chest pain and leg swelling.  Gastrointestinal: Negative for nausea, vomiting, abdominal pain, diarrhea and constipation.  Genitourinary: Negative for dysuria and difficulty urinating.  Musculoskeletal: Negative for myalgias and arthralgias.  Skin: Negative for wound.  Neurological: Negative for syncope and headaches.  Psychiatric/Behavioral: Negative for behavioral problems.  All other systems reviewed and are negative.     Allergies  Review of patient's allergies indicates no known allergies.  Home Medications   Prior to Admission medications   Medication Sig Start Date End Date Taking? Authorizing Provider  cholecalciferol (VITAMIN D) 1000 UNITS tablet Take 1,000 Units by mouth daily.   Yes Historical Provider, MD  ferrous sulfate 325 (65 FE) MG tablet Take 325 mg by mouth daily with breakfast.   Yes Historical Provider, MD  magnesium gluconate (MAGONATE) 500 MG tablet Take 500 mg by mouth daily.   Yes Historical Provider, MD  pantoprazole (PROTONIX) 40 MG tablet Take 40 mg by mouth daily.   Yes Historical Provider, MD  PYRIDOXINE HCL PO Take 1 tablet by mouth every evening.   Yes Historical Provider, MD  tadalafil (CIALIS) 10 MG tablet Take 10 mg by mouth daily as needed for erectile dysfunction.   Yes Historical Provider, MD  tamsulosin (FLOMAX) 0.4 MG CAPS Take 1 capsule (0.4 mg total) by mouth  daily. Patient taking differently: Take 0.4 mg by mouth daily after supper.  08/15/12  Yes Velvet Bathe, MD  testosterone (ANDROGEL) 50 MG/5GM (1%) GEL Place 2.5 g onto the skin 2 (two) times daily.   Yes Historical Provider, MD  traMADol (ULTRAM) 50 MG tablet Take 50 mg by mouth every 8 (eight) hours as needed for moderate pain.   Yes Historical Provider, MD  vitamin B-12 (CYANOCOBALAMIN) 500 MCG tablet Take 500 mcg by mouth 2 (two) times daily. BID   Yes Historical Provider, MD  XARELTO  STARTER PACK 15 & 20 MG TBPK Take 15-20 mg by mouth as directed. Take as directed on package: Start with one 15mg  tablet by mouth twice a day with food. On Day 22, switch to one 20mg  tablet once a day with food. Patient taking differently: Take 15-20 mg by mouth as directed. Take as directed on package: Start with one 15mg  tablet by mouth twice a day with food. On Day 22, switch to one 20mg  tablet once a day with food.  Starts 01/24/15, 20mg  daily dosage 01/02/15  Yes Davonna Belling, MD  fluticasone [facility]) 50 MCG/ACT nasal spray Place 1 spray into both nostrils daily. Patient not taking: Reported on 01/02/2015 03/30/13   Noemi Chapel, MD  galantamine (RAZADYNE) 4 MG tablet Take 1tab BID.  Call office in 4 weeks for refill. Patient not taking: Reported on 01/02/2015 05/27/14   Pieter Partridge, DO  oxymetazoline (AFRIN NASAL SPRAY) 0.05 % nasal spray Place 1 spray into both nostrils 2 (two) times daily. Patient not taking: Reported on 01/02/2015 03/30/13   Noemi Chapel, MD   BP 115/67 mmHg  Pulse 55  Temp(Src) 98.1 F (36.7 C) (Oral)  Resp 18  Ht 5\' 8"  (1.727 m)  Wt 166 lb (75.297 kg)  BMI 25.25 kg/m2  SpO2 98% Physical Exam  Constitutional: He is oriented to person, place, and time. He appears well-developed and well-nourished.  HENT:  Head: Normocephalic and atraumatic.  Eyes: EOM are normal.  Neck: Normal range of motion.  Cardiovascular: Normal rate, regular rhythm and normal heart sounds.   No murmur heard. Pulmonary/Chest: Effort normal. No respiratory distress. He has wheezes in the right upper field.  Abdominal: Soft. There is no tenderness.  Musculoskeletal: He exhibits no edema.  Neurological: He is alert and oriented to person, place, and time.  Skin: No rash noted. He is not diaphoretic.    ED Course  Procedures (including critical care time) Labs Review Labs Reviewed  BASIC METABOLIC PANEL - Abnormal; Notable for the following:    Creatinine, Ser 1.25 (*)    Calcium 10.4  (*)    GFR calc non Af Amer 53 (*)    All other components within normal limits  CBC  BRAIN NATRIURETIC PEPTIDE  I-STAT TROPOININ, ED  Randolm Idol, ED    Imaging Review Dg Chest 2 View  01/22/2015   CLINICAL DATA:  Shortness of breath. History of DVT diagnosed approximately 2 weeks ago.  EXAM: CHEST  2 VIEW  COMPARISON:  02/13/2012  FINDINGS: Cardiomediastinal silhouette is normal. Mediastinal contours appear intact. There is a large hiatal hernia.  There is no evidence of focal airspace consolidation, pleural effusion or pneumothorax.  Osseous structures are without acute abnormality. Diffuse idiopathic skeletal hyperostosis of the thoraco lumbar spine is noted. Soft tissues are grossly normal.  IMPRESSION: No active cardiopulmonary disease.  Large hiatal hernia.   Electronically Signed   By: Fidela Salisbury M.D.   On: 01/22/2015 17:03  Ct Angio Chest Pe W/cm &/or Wo Cm  01/22/2015   CLINICAL DATA:  Chest pain and shortness of breath beginning today. Currently being treated for right lower extremity DVT.  EXAM: CT ANGIOGRAPHY CHEST WITH CONTRAST  TECHNIQUE: Multidetector CT imaging of the chest was performed using the standard protocol during bolus administration of intravenous contrast. Multiplanar CT image reconstructions and MIPs were obtained to evaluate the vascular anatomy.  CONTRAST:  70 mL OMNIPAQUE IOHEXOL 350 MG/ML SOLN  COMPARISON:  Chest x-ray today.  FINDINGS: Lungs are adequately inflated with minimal bibasilar dependent atelectasis. Airways are normal.  There is mild cardiomegaly. There is a moderate size hiatal hernia. There is no evidence of pulmonary emboli. There is mild calcified plaque over the left anterior descending coronary artery. Very minimal calcified plaque over the takeoff of the left common carotid artery. There is no mediastinal, hilar or axillary adenopathy. Remaining mediastinal structures are within normal. There is a sub cm hypodensity over the left lobe  of the thyroid.  Images through the upper abdomen are within normal. There is moderate degenerate changes spine with prominent anterior osteophytes.  Review of the MIP images confirms the above findings.  IMPRESSION: No evidence of pulmonary embolism. No acute cardiopulmonary disease.  Mild cardiomegaly. Single vessel atherosclerotic coronary artery disease.  Moderate size hiatal hernia.   Electronically Signed   By: Marin Olp M.D.   On: 01/22/2015 19:47   I have personally reviewed and evaluated these images and lab results as part of my medical decision-making.   EKG Interpretation   Date/Time:  Wednesday January 22 2015 16:28:14 EDT Ventricular Rate:  58 PR Interval:  212 QRS Duration: 78 QT Interval:  388 QTC Calculation: 380 R Axis:   16 Text Interpretation:  Sinus bradycardia with 1st degree A-V block  Otherwise normal ECG No significant change since last tracing Confirmed by  YAO  MD, DAVID ([id number]) on 01/22/2015 6:40:12 PM      MDM   Final diagnoses:  Chest pain, unspecified chest pain type     Patient is a 80 year old male with recent history of DVT on Xarelto presents with sudden onset chest pain or shortness of breath. Upon arrival to the ED the patient is hemodynamically stable. Patient states the pain has resolved. Patient is normal heart rate, satting 100% on room air and not ischemic. Patient does have focal wheezing in his right upper lung field. Patient denies any history of asthma. Patient was seen at his PCP and sent here for evaluation for PE. We will send screening labs and get a PE CT study.  Patient's EKG is first-degree AV block without evidence of sinus tachycardia or S1Q3T3.  Patient's PE study came back negative. Patient has 2 negative troponins. Patient remained asymptomatic while in the ED. Patient does have a slight increase in his creatinine was given a fluid bolus and patient has been pain adequately. Patient is discharged with return precautions and  follow-up with PCP.  Renne Musca, MD 01/22/15 2135  Wandra Arthurs, MD 01/23/15 2105

## 2015-01-22 NOTE — ED Notes (Signed)
Patient transported to CT 

## 2015-01-22 NOTE — ED Notes (Signed)
Pt  Recently  Had    dvt      Taking  xarelto     Developed   Chest  Pain   Today      At  345  Pm             Skin is  Warm   And  Dry           Pt    Reports   Weak  As   Well  As  Dizzy

## 2015-01-22 NOTE — ED Notes (Signed)
Pt here for chest pain, SOB that started today. Pt currently being treated for DVT in right leg. Sent here r/o PE

## 2015-01-22 NOTE — ED Notes (Signed)
Pt on bedside commode,

## 2015-01-22 NOTE — Discharge Instructions (Signed)

## 2015-01-22 NOTE — ED Provider Notes (Signed)
CSN: [id number]     Arrival date & time 01/22/15  1529 History   None    Chief Complaint  Patient presents with  . Chest Pain   (Consider location/radiation/quality/duration/timing/severity/associated sxs/prior Treatment) HPI  Past Medical History  Diagnosis Date  . BPH (benign prostatic hyperplasia)   . Arthritis   . GERD (gastroesophageal reflux disease)   . Seasonal allergies   . Blood transfusion   . Bilateral cataracts   . Memory changes    Past Surgical History  Procedure Laterality Date  . Shoulder surgery  2002    right  . Carpal tunnel release  1990    bilateral  . Cyst on spine  1959   Family History  Problem Relation Age of Onset  . Colon cancer Neg Hx   . Stomach cancer Neg Hx   . Dementia Father   . Dementia Brother    Social History  Substance Use Topics  . Smoking status: Former Smoker    Quit date: 10/06/1963  . Smokeless tobacco: Never Used  . Alcohol Use: No    Review of Systems  Constitutional: Negative.   HENT: Negative.   Respiratory: Positive for chest tightness and shortness of breath.   Cardiovascular: Positive for chest pain. Negative for palpitations and leg swelling.  Gastrointestinal: Negative.     Allergies  Review of patient's allergies indicates no known allergies.  Home Medications   Prior to Admission medications   Medication Sig Start Date End Date Taking? Authorizing Provider  aspirin 81 MG tablet Take 81 mg by mouth daily.    Historical Provider, MD  cholecalciferol (VITAMIN D) 1000 UNITS tablet Take 1,000 Units by mouth daily.    Historical Provider, MD  ferrous sulfate 325 (65 FE) MG tablet Take 325 mg by mouth daily with breakfast.    Historical Provider, MD  fluticasone (FLONASE) 50 MCG/ACT nasal spray Place 1 spray into both nostrils daily. Patient not taking: Reported on 01/02/2015 03/30/13   Noemi Chapel, MD  galantamine (RAZADYNE) 4 MG tablet Take 1tab BID.  Call office in 4 weeks for refill. Patient not taking:  Reported on 01/02/2015 05/27/14   Pieter Partridge, DO  HYDROcodone-acetaminophen (VICODIN ES) 7.5-750 MG per tablet Take 1 tablet by mouth every 6 (six) hours as needed.    Historical Provider, MD  magnesium gluconate (MAGONATE) 500 MG tablet Take 500 mg by mouth daily.    Historical Provider, MD  OVER THE COUNTER MEDICATION Take 1 Dose by mouth daily. Prostangin TriMix    Historical Provider, MD  oxymetazoline (AFRIN NASAL SPRAY) 0.05 % nasal spray Place 1 spray into both nostrils 2 (two) times daily. Patient not taking: Reported on 01/02/2015 03/30/13   Noemi Chapel, MD  pantoprazole (PROTONIX) 40 MG tablet Take 40 mg by mouth daily.    Historical Provider, MD  tadalafil (CIALIS) 10 MG tablet Take 10 mg by mouth daily as needed for erectile dysfunction.    Historical Provider, MD  tamsulosin (FLOMAX) 0.4 MG CAPS Take 1 capsule (0.4 mg total) by mouth daily. Patient taking differently: Take 0.4 mg by mouth 2 (two) times daily.  08/15/12   Velvet Bathe, MD  testosterone (ANDROGEL) 50 MG/5GM GEL Place 5 g onto the skin daily.    Historical Provider, MD  traMADol (ULTRAM) 50 MG tablet Take 50 mg by mouth every 8 (eight) hours as needed for moderate pain.    Historical Provider, MD  vitamin B-12 (CYANOCOBALAMIN) 500 MCG tablet Take 500 mcg by mouth daily.  BID    Historical Provider, MD  XARELTO STARTER PACK 15 & 20 MG TBPK Take 15-20 mg by mouth as directed. Take as directed on package: Start with one 15mg  tablet by mouth twice a day with food. On Day 22, switch to one 20mg  tablet once a day with food. 01/02/15   Davonna Belling, MD   Meds Ordered and Administered this Visit  Medications - No data to display  BP 129/85 mmHg  Pulse 64  Temp(Src) 98.4 F (36.9 C) (Oral)  Resp 18  SpO2 98% No data found.   Physical Exam  Constitutional: He is oriented to person, place, and time. He appears well-developed and well-nourished. No distress.  Neck: Normal range of motion. Neck supple.  Cardiovascular:  Normal heart sounds and intact distal pulses.   Pulmonary/Chest: Effort normal and breath sounds normal.  Lymphadenopathy:    He has no cervical adenopathy.  Neurological: He is alert and oriented to person, place, and time.  Skin: Skin is warm and dry.  Nursing note and vitals reviewed.   ED Course  Procedures (including critical care time)  Labs Review Labs Reviewed - No data to display ED ECG REPORT   Date: 01/22/2015  Rate: 63  Rhythm: normal sinus rhythm  QRS Axis: normal  Intervals: normal  ST/T Wave abnormalities: normal  Conduction Disutrbances:first-degree A-V block   Narrative Interpretation:   Old EKG Reviewed: none available  I have personally reviewed the EKG tracing and agree with the computerized printout as noted.  Imaging Review No results found.   Visual Acuity Review  Right Eye Distance:   Left Eye Distance:   Bilateral Distance:    Right Eye Near:   Left Eye Near:    Bilateral Near:         MDM   1. Pulmonary emboli    Sent with eval of PE , seen in ER 8/11 with dvt dx on xarelto , sudden cp and sob this pm, sent for check.    Billy Fischer, MD 01/22/15 [phone]

## 2015-01-30 DIAGNOSIS — M25562 Pain in left knee: Secondary | ICD-10-CM | POA: Diagnosis not present

## 2015-01-30 DIAGNOSIS — K219 Gastro-esophageal reflux disease without esophagitis: Secondary | ICD-10-CM | POA: Diagnosis not present

## 2015-01-30 DIAGNOSIS — Z23 Encounter for immunization: Secondary | ICD-10-CM | POA: Diagnosis not present

## 2015-01-30 DIAGNOSIS — Z79899 Other long term (current) drug therapy: Secondary | ICD-10-CM | POA: Diagnosis not present

## 2015-01-30 DIAGNOSIS — Z7901 Long term (current) use of anticoagulants: Secondary | ICD-10-CM | POA: Diagnosis not present

## 2015-01-30 DIAGNOSIS — I82401 Acute embolism and thrombosis of unspecified deep veins of right lower extremity: Secondary | ICD-10-CM | POA: Diagnosis not present

## 2015-01-30 DIAGNOSIS — R972 Elevated prostate specific antigen [PSA]: Secondary | ICD-10-CM | POA: Diagnosis not present

## 2015-01-30 DIAGNOSIS — R252 Cramp and spasm: Secondary | ICD-10-CM | POA: Diagnosis not present

## 2015-02-25 DIAGNOSIS — L821 Other seborrheic keratosis: Secondary | ICD-10-CM | POA: Diagnosis not present

## 2015-02-25 DIAGNOSIS — D1801 Hemangioma of skin and subcutaneous tissue: Secondary | ICD-10-CM | POA: Diagnosis not present

## 2015-02-25 DIAGNOSIS — L820 Inflamed seborrheic keratosis: Secondary | ICD-10-CM | POA: Diagnosis not present

## 2015-02-25 DIAGNOSIS — L814 Other melanin hyperpigmentation: Secondary | ICD-10-CM | POA: Diagnosis not present

## 2015-02-25 DIAGNOSIS — D225 Melanocytic nevi of trunk: Secondary | ICD-10-CM | POA: Diagnosis not present

## 2015-05-01 DIAGNOSIS — J019 Acute sinusitis, unspecified: Secondary | ICD-10-CM | POA: Diagnosis not present

## 2015-06-04 DIAGNOSIS — N401 Enlarged prostate with lower urinary tract symptoms: Secondary | ICD-10-CM | POA: Diagnosis not present

## 2015-06-04 DIAGNOSIS — R3915 Urgency of urination: Secondary | ICD-10-CM | POA: Diagnosis not present

## 2015-06-04 DIAGNOSIS — E291 Testicular hypofunction: Secondary | ICD-10-CM | POA: Diagnosis not present

## 2015-06-04 DIAGNOSIS — R972 Elevated prostate specific antigen [PSA]: Secondary | ICD-10-CM | POA: Diagnosis not present

## 2015-06-04 DIAGNOSIS — Z0000 Encounter for general adult medical examination without abnormal findings: Secondary | ICD-10-CM | POA: Diagnosis not present

## 2015-06-04 DIAGNOSIS — N5201 Erectile dysfunction due to arterial insufficiency: Secondary | ICD-10-CM | POA: Diagnosis not present

## 2015-06-16 DIAGNOSIS — J019 Acute sinusitis, unspecified: Secondary | ICD-10-CM | POA: Diagnosis not present

## 2015-06-17 DIAGNOSIS — N402 Nodular prostate without lower urinary tract symptoms: Secondary | ICD-10-CM | POA: Diagnosis not present

## 2015-06-24 DIAGNOSIS — N401 Enlarged prostate with lower urinary tract symptoms: Secondary | ICD-10-CM | POA: Diagnosis not present

## 2015-06-24 DIAGNOSIS — Z0000 Encounter for general adult medical examination without abnormal findings: Secondary | ICD-10-CM | POA: Diagnosis not present

## 2015-06-24 DIAGNOSIS — R3912 Poor urinary stream: Secondary | ICD-10-CM | POA: Diagnosis not present

## 2015-06-24 DIAGNOSIS — R3915 Urgency of urination: Secondary | ICD-10-CM | POA: Diagnosis not present

## 2015-06-24 DIAGNOSIS — N3941 Urge incontinence: Secondary | ICD-10-CM | POA: Diagnosis not present

## 2015-06-26 ENCOUNTER — Encounter: Payer: Self-pay | Admitting: Neurology

## 2015-06-30 ENCOUNTER — Emergency Department (HOSPITAL_COMMUNITY): Payer: Commercial Managed Care - HMO

## 2015-06-30 ENCOUNTER — Emergency Department (HOSPITAL_COMMUNITY)
Admission: EM | Admit: 2015-06-30 | Discharge: 2015-06-30 | Disposition: A | Discharge status: Home / Self Care | Payer: Commercial Managed Care - HMO | Attending: Emergency Medicine | Admitting: Emergency Medicine

## 2015-06-30 DIAGNOSIS — Z79899 Other long term (current) drug therapy: Secondary | ICD-10-CM | POA: Insufficient documentation

## 2015-06-30 DIAGNOSIS — M1990 Unspecified osteoarthritis, unspecified site: Secondary | ICD-10-CM | POA: Diagnosis not present

## 2015-06-30 DIAGNOSIS — J069 Acute upper respiratory infection, unspecified: Secondary | ICD-10-CM | POA: Diagnosis not present

## 2015-06-30 DIAGNOSIS — Z87891 Personal history of nicotine dependence: Secondary | ICD-10-CM | POA: Diagnosis not present

## 2015-06-30 DIAGNOSIS — R319 Hematuria, unspecified: Secondary | ICD-10-CM | POA: Diagnosis not present

## 2015-06-30 DIAGNOSIS — R1031 Right lower quadrant pain: Secondary | ICD-10-CM | POA: Insufficient documentation

## 2015-06-30 DIAGNOSIS — R05 Cough: Secondary | ICD-10-CM | POA: Diagnosis not present

## 2015-06-30 DIAGNOSIS — Z86718 Personal history of other venous thrombosis and embolism: Secondary | ICD-10-CM | POA: Diagnosis not present

## 2015-06-30 DIAGNOSIS — M159 Polyosteoarthritis, unspecified: Secondary | ICD-10-CM | POA: Diagnosis not present

## 2015-06-30 LAB — COMPREHENSIVE METABOLIC PANEL
ALT: 18 U/L (ref 17–63)
AST: 29 U/L (ref 15–41)
Albumin: 3.6 g/dL (ref 3.5–5.0)
Alkaline Phosphatase: 54 U/L (ref 38–126)
Anion gap: 10 (ref 5–15)
BUN: 18 mg/dL (ref 6–20)
CO2: 25 mmol/L (ref 22–32)
Calcium: 10 mg/dL (ref 8.9–10.3)
Chloride: 104 mmol/L (ref 101–111)
Creatinine, Ser: 1.19 mg/dL (ref 0.61–1.24)
GFR calc Af Amer: >60 mL/min (ref >60)
GFR calc non Af Amer: 57 mL/min — ABNORMAL LOW (ref >60)
Glucose, Bld: 110 mg/dL — ABNORMAL HIGH (ref 65–99)
Potassium: 4.4 mmol/L (ref 3.5–5.1)
Sodium: 139 mmol/L (ref 135–145)
Total Bilirubin: 0.6 mg/dL (ref 0.3–1.2)
Total Protein: 6.2 g/dL — ABNORMAL LOW (ref 6.5–8.1)

## 2015-06-30 LAB — URINE MICROSCOPIC-ADD ON
SQUAMOUS EPITHELIAL / LPF: NONE SEEN
WBC UA: NONE SEEN WBC/hpf (ref 0–5)

## 2015-06-30 LAB — CBC WITH DIFFERENTIAL/PLATELET
Basophils Absolute: 0 10*3/uL (ref 0.0–0.1)
Basophils Relative: 1 %
Eosinophils Absolute: 0.5 10*3/uL (ref 0.0–0.7)
Eosinophils Relative: 11 %
HCT: 42.2 % (ref 39.0–52.0)
Hemoglobin: 14.5 g/dL (ref 13.0–17.0)
Lymphocytes Relative: 26 %
Lymphs Abs: 1.3 10*3/uL (ref 0.7–4.0)
MCH: 31.6 pg (ref 26.0–34.0)
MCHC: 34.4 g/dL (ref 30.0–36.0)
MCV: 91.9 fL (ref 78.0–100.0)
Monocytes Absolute: 0.6 10*3/uL (ref 0.1–1.0)
Monocytes Relative: 12 %
Neutro Abs: 2.4 10*3/uL (ref 1.7–7.7)
Neutrophils Relative %: 50 %
Platelets: 158 10*3/uL (ref 150–400)
RBC: 4.59 MIL/uL (ref 4.22–5.81)
RDW: 13.1 % (ref 11.5–15.5)
WBC: 4.7 10*3/uL (ref 4.0–10.5)

## 2015-06-30 LAB — URINALYSIS, ROUTINE W REFLEX MICROSCOPIC
Bilirubin Urine: NEGATIVE
Glucose, UA: NEGATIVE mg/dL
Ketones, ur: NEGATIVE mg/dL
Leukocytes, UA: NEGATIVE
Nitrite: NEGATIVE
PH: 7 (ref 5.0–8.0)
PROTEIN: NEGATIVE mg/dL
SPECIFIC GRAVITY, URINE: 1.016 (ref 1.005–1.030)

## 2015-06-30 LAB — I-STAT TROPONIN, ED: Troponin i, poc: 0 ng/mL (ref 0.00–0.08)

## 2015-06-30 LAB — PROTIME-INR
INR: 1.59 — ABNORMAL HIGH (ref 0.00–1.49)
Prothrombin Time: 19 seconds — ABNORMAL HIGH (ref 11.6–15.2)

## 2015-06-30 NOTE — ED Notes (Signed)
Pt also endorsing shortness of breath and cough.

## 2015-06-30 NOTE — ED Notes (Signed)
The pt had a urology procedure Tuesday.  Since than he has been bleeding.  The bleeding has increased and he is in much pain.  He is  Having difficulty urinating there is so much bleeding no temp

## 2015-06-30 NOTE — ED Provider Notes (Signed)
CSN: [id number]     Arrival date & time 06/30/15  0453 History   First MD Initiated Contact with Patient 06/30/15 0534     Chief Complaint  Patient presents with  . Hematuria   HPI    81 -year-old male presents today with complaints hematuria. Patient reports that he's had a extended history of frequent urinations, reporting urinations every hour. He reports that he followed up with Dr. Gaynelle Arabian and had a urodynamic test done 6 days ago. He reports that the operator had difficulty inserting the catheter, which require Dr. Zadie Rhine assistance. He notes since that time he's had worsening bleeding, red colored urine and blood leakage from his penis in between urinary episodes. Patient notes he also has some mild right lower quadrant pain that has been present for the last week, this is migratory sometimes in his right upper quadrant sometimes left upper quadrant.. Patient states that approximately 6 months ago he is placed on Xarelto for DVTs, follow-up with his primary care provider on Thursday for reevaluation of anticoagulation needed. Pt has not contacted Dr. Dorris Fetch office regarding this issue.   Patient also notes that he's had respiratory congestion since around January 16 ( 3 weeks). He was seen by his primary care provider who placed him on Levaquin, last dose was one week ago. He called this week to schedule follow-up as he's had intermittent nosebleeds, he was referred to ENT and has not had his appointment scheduled at this point.  Patient denies any fever, chills, nausea, vomiting, change in bowel characteristics or frequency, he denies productive cough.   Past Medical History  Diagnosis Date  . BPH (benign prostatic hyperplasia)   . Arthritis   . GERD (gastroesophageal reflux disease)   . Seasonal allergies   . Blood transfusion   . Bilateral cataracts   . Memory changes    Past Surgical History  Procedure Laterality Date  . Shoulder surgery  2002    right  . Carpal  tunnel release  1990    bilateral  . Cyst on spine  1959   Family History  Problem Relation Age of Onset  . Colon cancer Neg Hx   . Stomach cancer Neg Hx   . Dementia Father   . Dementia Brother    Social History  Substance Use Topics  . Smoking status: Former Smoker    Quit date: 10/06/1963  . Smokeless tobacco: Never Used  . Alcohol Use: No    Review of Systems  All other systems reviewed and are negative.   Allergies  Review of patient's allergies indicates no known allergies.  Home Medications   Prior to Admission medications   Medication Sig Start Date End Date Taking? Authorizing Provider  cholecalciferol (VITAMIN D) 1000 UNITS tablet Take 1,000 Units by mouth daily.   Yes Historical Provider, MD  ferrous sulfate 325 (65 FE) MG tablet Take 325 mg by mouth daily with breakfast.   Yes Historical Provider, MD  fluticasone (FLONASE) 50 MCG/ACT nasal spray Place 1 spray into both nostrils daily. Patient taking differently: Place 1 spray into both nostrils daily as needed for allergies.  03/30/13  Yes Noemi Chapel, MD  guaiFENesin (MUCINEX) 600 MG 12 hr tablet Take 600 mg by mouth 2 (two) times daily as needed for to loosen phlegm.   Yes Historical Provider, MD  magnesium gluconate (MAGONATE) 500 MG tablet Take 500 mg by mouth daily.   Yes Historical Provider, MD  mirabegron ER (MYRBETRIQ) 50 MG TB24 tablet Take 50  mg by mouth every evening.   Yes Historical Provider, MD  pantoprazole (PROTONIX) 40 MG tablet Take 40 mg by mouth daily.   Yes Historical Provider, MD  pyridOXINE (VITAMIN B-6) 100 MG tablet Take 100 mg by mouth daily.   Yes Historical Provider, MD  tadalafil (CIALIS) 10 MG tablet Take 10 mg by mouth daily as needed for erectile dysfunction.   Yes Historical Provider, MD  tamsulosin (FLOMAX) 0.4 MG CAPS Take 1 capsule (0.4 mg total) by mouth daily. Patient taking differently: Take 0.4 mg by mouth 2 (two) times daily.  08/15/12  Yes Velvet Bathe, MD  testosterone  (ANDROGEL) 50 MG/5GM (1%) GEL Place 2.5 g onto the skin 2 (two) times daily.   Yes Historical Provider, MD  traMADol (ULTRAM) 50 MG tablet Take 50 mg by mouth every 8 (eight) hours as needed for moderate pain.   Yes Historical Provider, MD  vitamin B-12 (CYANOCOBALAMIN) 500 MCG tablet Take 500 mcg by mouth 2 (two) times daily. BID   Yes Historical Provider, MD  XARELTO STARTER PACK 15 & 20 MG TBPK Take 15-20 mg by mouth as directed. Take as directed on package: Start with one 15mg  tablet by mouth twice a day with food. On Day 22, switch to one 20mg  tablet once a day with food. Patient taking differently: Take 20 mg by mouth daily.  01/02/15  Yes Davonna Belling, MD   BP 154/89 mmHg  Pulse 53  Temp(Src) 97.9 F (36.6 C) (Oral)  Resp 14  Ht 5\' 9"  (1.753 m)  Wt 76.658 kg  BMI 24.95 kg/m2  SpO2 98%   Physical Exam  Constitutional: He is oriented to person, place, and time. He appears well-developed and well-nourished.  HENT:  Head: Normocephalic and atraumatic.  Eyes: Conjunctivae are normal. Pupils are equal, round, and reactive to light. Right eye exhibits no discharge. Left eye exhibits no discharge. No scleral icterus.  Neck: Normal range of motion. No JVD present. No tracheal deviation present.  Cardiovascular: Normal rate, regular rhythm, normal heart sounds and intact distal pulses.  Exam reveals no gallop and no friction rub.   No murmur heard. Pulmonary/Chest: Effort normal and breath sounds normal. No stridor. No respiratory distress. He has no wheezes. He has no rales. He exhibits no tenderness.  Abdominal: He exhibits no distension and no mass. There is tenderness. There is no rebound and no guarding.  Very minor ttp pf right lower quadrant  Musculoskeletal: He exhibits no edema or tenderness.  Neurological: He is alert and oriented to person, place, and time. Coordination normal.  Skin: Skin is warm and dry. No rash noted. No erythema. No pallor.  Psychiatric: He has a normal  mood and affect. His behavior is normal. Judgment and thought content normal.  Nursing note and vitals reviewed.   ED Course  Procedures (including critical care time) Labs Review Labs Reviewed  URINALYSIS, ROUTINE W REFLEX MICROSCOPIC (NOT AT [facility]) - Abnormal; Notable for the following:    Hgb urine dipstick LARGE (*)    All other components within normal limits  PROTIME-INR - Abnormal; Notable for the following:    Prothrombin Time 19.0 (*)    INR 1.59 (*)    All other components within normal limits  COMPREHENSIVE METABOLIC PANEL - Abnormal; Notable for the following:    Glucose, Bld 110 (*)    Total Protein 6.2 (*)    GFR calc non Af Amer 57 (*)    All other components within normal limits  URINE  MICROSCOPIC-ADD ON - Abnormal; Notable for the following:    Bacteria, UA RARE (*)    All other components within normal limits  CBC WITH DIFFERENTIAL/PLATELET  Randolm Idol, ED    Imaging Review Dg Chest 2 View  06/30/2015  CLINICAL DATA:  Cough EXAM: CHEST  2 VIEW COMPARISON:  A 06/22/2014 FINDINGS: Lungs are clear without infiltrate or effusion. Negative for heart failure or edema. Negative for mass lesion. Moderately large hiatal hernia. IMPRESSION: No active cardiopulmonary disease. Electronically Signed   By: Franchot Gallo M.D.   On: 06/30/2015 07:03   I have personally reviewed and evaluated these images and lab results as part of my medical decision-making.   EKG Interpretation   Date/Time:  Monday June 30 2015 07:29:50 EST Ventricular Rate:  52 PR Interval:  239 QRS Duration: 83 QT Interval:  427 QTC Calculation: 397 R Axis:   57 Text Interpretation:  Sinus rhythm Prolonged PR interval Low voltage,  precordial leads Sinus rhythm T wave abnormality Low voltage QRS No  significant change since last tracing Abnormal ekg Confirmed by Carmin Muskrat  MD [phone]) on 06/30/2015 7:46:18 AM      MDM   Final diagnoses:  Hematuria    Labs: I-STAT troponin,  urinalysis, urine microscopic, CBC, PT/INR, CMP  Imaging: DG chest 2 view  Consults:  Therapeutics:  Discharge Meds:   Assessment/Plan: 81 year old male presents today with hematuria. Recent instrumentation likely the cause of this. Patient is taking Xarelto for unprovoked DVT. Consult with gastroenterology instructed patient to follow up with primary care for determination of quite relation necessity. Patient has a normal hemoglobin, no significant abdominal tenderness, and no active bleeding. His urine was clear upon repeat urine evaluation, no indication of brisk bleeding. Patient will be instructed follow-up with his primary care for discussion of anticoagulation, follow up with gastroenterology if symptoms continue to persist. Patient also reports ongoing upper respiratory congestion, has been on numerous rounds of antibiotics with ENT referral. He is instructed to continue with this follow-up, and return to the emergency room if any worsening signs symptoms present. Both patient, his wife, and his son verbalized understanding and agreement to today's plan and had no further questions concerns at time of discharge.        Okey Regal, PA-C 06/30/15 [id number]  Merryl Hacker, MD 07/02/15 [phone]

## 2015-06-30 NOTE — Discharge Instructions (Signed)

## 2015-07-07 DIAGNOSIS — N401 Enlarged prostate with lower urinary tract symptoms: Secondary | ICD-10-CM | POA: Diagnosis not present

## 2015-07-07 DIAGNOSIS — R3915 Urgency of urination: Secondary | ICD-10-CM | POA: Diagnosis not present

## 2015-07-07 DIAGNOSIS — R972 Elevated prostate specific antigen [PSA]: Secondary | ICD-10-CM | POA: Diagnosis not present

## 2015-07-07 DIAGNOSIS — N402 Nodular prostate without lower urinary tract symptoms: Secondary | ICD-10-CM | POA: Diagnosis not present

## 2015-07-07 DIAGNOSIS — N138 Other obstructive and reflux uropathy: Secondary | ICD-10-CM | POA: Diagnosis not present

## 2015-08-05 DIAGNOSIS — R0982 Postnasal drip: Secondary | ICD-10-CM | POA: Diagnosis not present

## 2015-08-05 DIAGNOSIS — R0981 Nasal congestion: Secondary | ICD-10-CM | POA: Diagnosis not present

## 2015-08-11 DIAGNOSIS — J3489 Other specified disorders of nose and nasal sinuses: Secondary | ICD-10-CM | POA: Diagnosis not present

## 2015-08-11 DIAGNOSIS — R0981 Nasal congestion: Secondary | ICD-10-CM | POA: Diagnosis not present

## 2015-08-20 ENCOUNTER — Other Ambulatory Visit: Payer: Self-pay | Admitting: Urology

## 2015-08-20 ENCOUNTER — Telehealth: Payer: Self-pay | Admitting: Cardiovascular Disease

## 2015-08-20 NOTE — Telephone Encounter (Signed)
Received records from [place] for appointment on 09/23/15 with Dr Gwenlyn Found.  Records given to [facility] (medical records) for Dr Kennon Holter schedule on 09/23/15. lp

## 2015-09-01 DIAGNOSIS — Z0000 Encounter for general adult medical examination without abnormal findings: Secondary | ICD-10-CM | POA: Diagnosis not present

## 2015-09-01 DIAGNOSIS — R3915 Urgency of urination: Secondary | ICD-10-CM | POA: Diagnosis not present

## 2015-09-01 DIAGNOSIS — N138 Other obstructive and reflux uropathy: Secondary | ICD-10-CM | POA: Diagnosis not present

## 2015-09-01 DIAGNOSIS — R972 Elevated prostate specific antigen [PSA]: Secondary | ICD-10-CM | POA: Diagnosis not present

## 2015-09-01 DIAGNOSIS — N401 Enlarged prostate with lower urinary tract symptoms: Secondary | ICD-10-CM | POA: Diagnosis not present

## 2015-09-09 DIAGNOSIS — R3915 Urgency of urination: Secondary | ICD-10-CM | POA: Diagnosis not present

## 2015-09-09 DIAGNOSIS — R972 Elevated prostate specific antigen [PSA]: Secondary | ICD-10-CM | POA: Diagnosis not present

## 2015-09-09 DIAGNOSIS — M6281 Muscle weakness (generalized): Secondary | ICD-10-CM | POA: Diagnosis not present

## 2015-09-09 DIAGNOSIS — R278 Other lack of coordination: Secondary | ICD-10-CM | POA: Diagnosis not present

## 2015-09-23 ENCOUNTER — Ambulatory Visit (INDEPENDENT_AMBULATORY_CARE_PROVIDER_SITE_OTHER): Payer: Commercial Managed Care - HMO | Admitting: Cardiovascular Disease

## 2015-09-23 ENCOUNTER — Encounter: Payer: Self-pay | Admitting: Cardiovascular Disease

## 2015-09-23 VITALS — BP 130/82 | HR 63 | Ht 68.0 in | Wt 169.0 lb

## 2015-09-23 DIAGNOSIS — I824Z1 Acute embolism and thrombosis of unspecified deep veins of right distal lower extremity: Secondary | ICD-10-CM | POA: Insufficient documentation

## 2015-09-23 NOTE — Patient Instructions (Signed)
Medication Instructions:  Your physician recommends that you continue on your current medications as directed. Please refer to the Current Medication list given to you today.   Labwork: none  Testing/Procedures: none  Follow-Up: Follow up with Dr. Berry as needed.   Any Other Special Instructions Will Be Listed Below (If Applicable).     If you need a refill on your cardiac medications before your next appointment, please call your pharmacy.   

## 2015-09-23 NOTE — Progress Notes (Signed)
09/23/2015 Raymond Hopkins   06/26/1934  [id number]  Primary Physician FULP, CAMMIE, MD Primary Cardiologist: Lorretta Harp MD Renae Gloss   HPI:  Raymond Hopkins is a 81 year old thin and fit-appearing married Caucasian male father of 36, grandfather 5 children accompanied by his wife Raymond Hopkins today. He is here for preoperative clearance prior to elective prostatectomy for BPH by Dr. Era Bumpers. I last saw him in the office 12/22/09 for asymptomatic bradycardia. I thought this was related to being. He has no cardiac risk factors. He's never had a heart attack or stroke. He denies chest pain or shortness of breath. E apparently had a right calf DVT in August of last year and was placed on oral anticoagulation for 6 months. This was subsequently discontinued.   Current Outpatient Prescriptions  Medication Sig Dispense Refill  . cholecalciferol (VITAMIN D) 1000 UNITS tablet Take 1,000 Units by mouth daily.    . ferrous sulfate 325 (65 FE) MG tablet Take 325 mg by mouth daily with breakfast.    . fluticasone (FLONASE) 50 MCG/ACT nasal spray Place 1 spray into both nostrils daily. (Patient taking differently: Place 1 spray into both nostrils daily as needed for allergies. ) 9 g 2  . guaiFENesin (MUCINEX) 600 MG 12 hr tablet Take 600 mg by mouth 2 (two) times daily as needed for to loosen phlegm.    . magnesium gluconate (MAGONATE) 500 MG tablet Take 500 mg by mouth daily.    . mirabegron ER (MYRBETRIQ) 50 MG TB24 tablet Take 50 mg by mouth every evening.    . pantoprazole (PROTONIX) 40 MG tablet Take 40 mg by mouth daily.    Marland Kitchen pyridOXINE (VITAMIN B-6) 100 MG tablet Take 100 mg by mouth daily.    . tadalafil (CIALIS) 10 MG tablet Take 10 mg by mouth daily as needed for erectile dysfunction.    . tamsulosin (FLOMAX) 0.4 MG CAPS Take 1 capsule (0.4 mg total) by mouth daily. (Patient taking differently: Take 0.4 mg by mouth 2 (two) times daily. ) 30 capsule 0  . testosterone (ANDROGEL) 50  MG/5GM (1%) GEL Place 2.5 g onto the skin 2 (two) times daily.    . traMADol (ULTRAM) 50 MG tablet Take 50 mg by mouth every 8 (eight) hours as needed for moderate pain.    . vitamin B-12 (CYANOCOBALAMIN) 500 MCG tablet Take 500 mcg by mouth 2 (two) times daily. BID    . XARELTO STARTER PACK 15 & 20 MG TBPK Take 15-20 mg by mouth as directed. Take as directed on package: Start with one 15mg  tablet by mouth twice a day with food. On Day 22, switch to one 20mg  tablet once a day with food. (Patient taking differently: Take 20 mg by mouth daily. ) 51 each 0   No current facility-administered medications for this visit.    No Known Allergies  Social History   Social History  . Marital Status: Married    Spouse Name: N/A  . Number of Children: N/A  . Years of Education: N/A   Occupational History  . Not on file.   Social History Main Topics  . Smoking status: Former Smoker    Quit date: 10/06/1963  . Smokeless tobacco: Never Used  . Alcohol Use: No  . Drug Use: Yes  . Sexual Activity:    Partners: Female   Other Topics Concern  . Not on file   Social History Narrative     Review of Systems: General: negative for  chills, fever, night sweats or weight changes.  Cardiovascular: negative for chest pain, dyspnea on exertion, edema, orthopnea, palpitations, paroxysmal nocturnal dyspnea or shortness of breath Dermatological: negative for rash Respiratory: negative for cough or wheezing Urologic: negative for hematuria Abdominal: negative for nausea, vomiting, diarrhea, bright red blood per rectum, melena, or hematemesis Neurologic: negative for visual changes, syncope, or dizziness All other systems reviewed and are otherwise negative except as noted above.    Blood pressure 130/82, pulse 63, height 5\' 8"  (1.727 m), weight 169 lb (76.658 kg).  General appearance: alert and no distress Neck: no adenopathy, no carotid bruit, no JVD, supple, symmetrical, trachea midline and thyroid  not enlarged, symmetric, no tenderness/mass/nodules Lungs: clear to auscultation bilaterally Heart: regular rate and rhythm, S1, S2 normal, no murmur, click, rub or gallop Extremities: extremities normal, atraumatic, no cyanosis or edema  EKG not performed today  ASSESSMENT AND PLAN:   Acute deep vein thrombosis (DVT) of distal end of right lower extremity [facility]) Raymond Hopkins had a DVT back in August of last year in his right calf. He is placed on Xarelto oral anticoagulation for 6 months. This was subsequently discontinued.      Lorretta Harp MD FACP,FACC,FAHA, [facility] 09/23/2015 3:35 PM

## 2015-09-23 NOTE — Assessment & Plan Note (Signed)
Raymond Hopkins had a DVT back in August of last year in his right calf. He is placed on Xarelto oral anticoagulation for 6 months. This was subsequently discontinued.

## 2015-09-26 DIAGNOSIS — R7301 Impaired fasting glucose: Secondary | ICD-10-CM | POA: Diagnosis not present

## 2015-09-26 DIAGNOSIS — G3184 Mild cognitive impairment, so stated: Secondary | ICD-10-CM | POA: Diagnosis not present

## 2015-09-26 DIAGNOSIS — M159 Polyosteoarthritis, unspecified: Secondary | ICD-10-CM | POA: Diagnosis not present

## 2015-09-26 DIAGNOSIS — K219 Gastro-esophageal reflux disease without esophagitis: Secondary | ICD-10-CM | POA: Diagnosis not present

## 2015-09-26 DIAGNOSIS — M545 Low back pain: Secondary | ICD-10-CM | POA: Diagnosis not present

## 2015-09-26 DIAGNOSIS — Z79899 Other long term (current) drug therapy: Secondary | ICD-10-CM | POA: Diagnosis not present

## 2015-09-26 DIAGNOSIS — R350 Frequency of micturition: Secondary | ICD-10-CM | POA: Diagnosis not present

## 2015-09-29 DIAGNOSIS — N138 Other obstructive and reflux uropathy: Secondary | ICD-10-CM | POA: Diagnosis not present

## 2015-09-29 DIAGNOSIS — N401 Enlarged prostate with lower urinary tract symptoms: Secondary | ICD-10-CM | POA: Diagnosis not present

## 2015-09-29 DIAGNOSIS — R278 Other lack of coordination: Secondary | ICD-10-CM | POA: Diagnosis not present

## 2015-09-29 DIAGNOSIS — M6281 Muscle weakness (generalized): Secondary | ICD-10-CM | POA: Diagnosis not present

## 2015-10-06 NOTE — Patient Instructions (Addendum)
Raymond Hopkins  10/06/2015   Your procedure is scheduled on: Monday 10/13/2015  Report to [facility] Main  Entrance take [place]  elevators to 3rd floor to  [place] at   0730  AM.  Call this number if you have problems the morning of surgery [phone]   Remember: ONLY 1 PERSON MAY GO WITH YOU TO SHORT STAY TO GET  READY MORNING OF [place].   Do not eat food or drink liquids :After Midnight on 10/11/2015!              FOLLOW BOWEL PREP INSTRUCTIONS FROM DR. TANNENBAUM'S OFFICE ON 10/12/2015 AND FOLLOW A CLEAR LIQUID DIET THAT DAY UP UNTIL MIDNIGHT!     Take these medicines the morning of surgery with A SIP OF WATER: Pantoprazole (Protonix)                                 You may not have any metal on your body including hair pins and              piercings  Do not wear jewelry, make-up, lotions, powders or perfumes, deodorant             Do not wear nail polish.  Do not shave  48 hours prior to surgery.              Men may shave face and neck.   Do not bring valuables to the hospital. [place].  Contacts, dentures or bridgework may not be worn into surgery.  Leave suitcase in the car. After surgery it may be brought to your room.                  Please read over the following fact sheets you were given: _____________________________________________________________________             [facility] - Preparing for Surgery Before surgery, you can play an important role.  Because skin is not sterile, your skin needs to be as free of germs as possible.  You can reduce the number of germs on your skin by washing with CHG (chlorahexidine gluconate) soap before surgery.  CHG is an antiseptic cleaner which kills germs and bonds with the skin to continue killing germs even after washing. Please DO NOT use if you have an allergy to CHG or antibacterial soaps.  If your skin becomes  reddened/irritated stop using the CHG and inform your nurse when you arrive at Short Stay. Do not shave (including legs and underarms) for at least 48 hours prior to the first CHG shower.  You may shave your face/neck. Please follow these instructions carefully:  1.  Shower with CHG Soap the night before surgery and the  morning of Surgery.  2.  If you choose to wash your hair, wash your hair first as usual with your  normal  shampoo.  3.  After you shampoo, rinse your hair and body thoroughly to remove the  shampoo.                           4.  Use CHG as you would any other liquid soap.  You can apply chg  directly  to the skin and wash                       Gently with a scrungie or clean washcloth.  5.  Apply the CHG Soap to your body ONLY FROM THE NECK DOWN.   Do not use on face/ open                           Wound or open sores. Avoid contact with eyes, ears mouth and genitals (private parts).                       Wash face,  Genitals (private parts) with your normal soap.             6.  Wash thoroughly, paying special attention to the area where your surgery  will be performed.  7.  Thoroughly rinse your body with warm water from the neck down.  8.  DO NOT shower/wash with your normal soap after using and rinsing off  the CHG Soap.                9.  Pat yourself dry with a clean towel.            10.  Wear clean pajamas.            11.  Place clean sheets on your bed the night of your first shower and do not  sleep with pets. Day of Surgery : Do not apply any lotions/deodorants the morning of surgery.  Please wear clean clothes to the hospital/surgery center.  FAILURE TO FOLLOW THESE INSTRUCTIONS MAY RESULT IN THE CANCELLATION OF YOUR SURGERY PATIENT SIGNATURE_________________________________  NURSE SIGNATURE__________________________________  ________________________________________________________________________   Raymond Hopkins  An incentive spirometer is a tool that  can help keep your lungs clear and active. This tool measures how well you are filling your lungs with each breath. Taking long deep breaths may help reverse or decrease the chance of developing breathing (pulmonary) problems (especially infection) following:  A long period of time when you are unable to move or be active. BEFORE THE PROCEDURE   If the spirometer includes an indicator to show your best effort, your nurse or respiratory therapist will set it to a desired goal.  If possible, sit up straight or lean slightly forward. Try not to slouch.  Hold the incentive spirometer in an upright position. INSTRUCTIONS FOR USE   Sit on the edge of your bed if possible, or sit up as far as you can in bed or on a chair.  Hold the incentive spirometer in an upright position.  Breathe out normally.  Place the mouthpiece in your mouth and seal your lips tightly around it.  Breathe in slowly and as deeply as possible, raising the piston or the ball toward the top of the column.  Hold your breath for 3-5 seconds or for as long as possible. Allow the piston or ball to fall to the bottom of the column.  Remove the mouthpiece from your mouth and breathe out normally.  Rest for a few seconds and repeat Steps 1 through 7 at least 10 times every 1-2 hours when you are awake. Take your time and take a few normal breaths between deep breaths.  The spirometer may include an indicator to show your best effort. Use the indicator as a goal to work toward during each repetition.  After each set of 10 deep breaths, practice coughing to be sure your lungs are clear. If you have an incision (the cut made at the time of surgery), support your incision when coughing by placing a pillow or rolled up towels firmly against it. Once you are able to get out of bed, walk around indoors and cough well. You may stop using the incentive spirometer when instructed by your caregiver.  RISKS AND COMPLICATIONS  Take your  time so you do not get dizzy or light-headed.  If you are in pain, you may need to take or ask for pain medication before doing incentive spirometry. It is harder to take a deep breath if you are having pain. AFTER USE  Rest and breathe slowly and easily.  It can be helpful to keep track of a log of your progress. Your caregiver can provide you with a simple table to help with this. If you are using the spirometer at home, follow these instructions: [place] IF:   You are having difficultly using the spirometer.  You have trouble using the spirometer as often as instructed.  Your pain medication is not giving enough relief while using the spirometer.  You develop fever of 100.5 F (38.1 C) or higher. SEEK IMMEDIATE MEDICAL CARE IF:   You cough up bloody sputum that had not been present before.  You develop fever of 102 F (38.9 C) or greater.  You develop worsening pain at or near the incision site. MAKE SURE YOU:   Understand these instructions.  Will watch your condition.  Will get help right away if you are not doing well or get worse. Document Released: 09/20/2006 Document Revised: 08/02/2011 Document Reviewed: 11/21/2006 ExitCare Patient Information 2014 ExitCare, [place].   ________________________________________________________________________ WHAT IS A BLOOD TRANSFUSION? Blood Transfusion Information  A transfusion is the replacement of blood or some of its parts. Blood is made up of multiple cells which provide different functions.  Red blood cells carry oxygen and are used for blood loss replacement.  White blood cells fight against infection.  Platelets control bleeding.  Plasma helps clot blood.  Other blood products are available for specialized needs, such as hemophilia or other clotting disorders. BEFORE THE TRANSFUSION  Who gives blood for transfusions?   Healthy volunteers who are fully evaluated to make sure their blood is safe. This is  blood bank blood. Transfusion therapy is the safest it has ever been in the practice of medicine. Before blood is taken from a donor, a complete history is taken to make sure that person has no history of diseases nor engages in risky social behavior (examples are intravenous drug use or sexual activity with multiple partners). The donor's travel history is screened to minimize risk of transmitting infections, such as malaria. The donated blood is tested for signs of infectious diseases, such as HIV and hepatitis. The blood is then tested to be sure it is compatible with you in order to minimize the chance of a transfusion reaction. If you or a relative donates blood, this is often done in anticipation of surgery and is not appropriate for emergency situations. It takes many days to process the donated blood. RISKS AND COMPLICATIONS Although transfusion therapy is very safe and saves many lives, the main dangers of transfusion include:   Getting an infectious disease.  Developing a transfusion reaction. This is an allergic reaction to something in the blood you were given. Every precaution is taken to prevent this. The decision to  have a blood transfusion has been considered carefully by your caregiver before blood is given. Blood is not given unless the benefits outweigh the risks. AFTER THE TRANSFUSION  Right after receiving a blood transfusion, you will usually feel much better and more energetic. This is especially true if your red blood cells have gotten low (anemic). The transfusion raises the level of the red blood cells which carry oxygen, and this usually causes an energy increase.  The nurse administering the transfusion will monitor you carefully for complications. HOME CARE INSTRUCTIONS  No special instructions are needed after a transfusion. You may find your energy is better. Speak with your caregiver about any limitations on activity for underlying diseases you may have. SEEK MEDICAL  CARE IF:   Your condition is not improving after your transfusion.  You develop redness or irritation at the intravenous (IV) site. SEEK IMMEDIATE MEDICAL CARE IF:  Any of the following symptoms occur over the next 12 hours:  Shaking chills.  You have a temperature by mouth above 102 F (38.9 C), not controlled by medicine.  Chest, back, or muscle pain.  People around you feel you are not acting correctly or are confused.  Shortness of breath or difficulty breathing.  Dizziness and fainting.  You get a rash or develop hives.  You have a decrease in urine output.  Your urine turns a dark color or changes to pink, red, or brown. Any of the following symptoms occur over the next 10 days:  You have a temperature by mouth above 102 F (38.9 C), not controlled by medicine.  Shortness of breath.  Weakness after normal activity.  The white part of the eye turns yellow (jaundice).  You have a decrease in the amount of urine or are urinating less often.  Your urine turns a dark color or changes to pink, red, or brown. Document Released: 05/07/2000 Document Revised: 08/02/2011 Document Reviewed: 12/25/2007 [facility] Patient Information 2014 [place], [place].  _______________________________________________________________________

## 2015-10-07 ENCOUNTER — Encounter (HOSPITAL_COMMUNITY)
Admission: RE | Admit: 2015-10-07 | Discharge: 2015-10-07 | Disposition: A | Discharge status: Home / Self Care | Payer: Commercial Managed Care - HMO | Source: Ambulatory Visit | Attending: Urology | Admitting: Urology

## 2015-10-07 ENCOUNTER — Encounter (HOSPITAL_COMMUNITY): Payer: Self-pay

## 2015-10-07 DIAGNOSIS — N400 Enlarged prostate without lower urinary tract symptoms: Secondary | ICD-10-CM | POA: Diagnosis not present

## 2015-10-07 DIAGNOSIS — Z01812 Encounter for preprocedural laboratory examination: Secondary | ICD-10-CM | POA: Diagnosis not present

## 2015-10-07 DIAGNOSIS — Z0183 Encounter for blood typing: Secondary | ICD-10-CM | POA: Diagnosis not present

## 2015-10-07 HISTORY — DX: Unspecified dementia, unspecified severity, without behavioral disturbance, psychotic disturbance, mood disturbance, and anxiety: F03.90

## 2015-10-07 LAB — CBC
HCT: 42.9 % (ref 39.0–52.0)
Hemoglobin: 14.6 g/dL (ref 13.0–17.0)
MCH: 31.4 pg (ref 26.0–34.0)
MCHC: 34 g/dL (ref 30.0–36.0)
MCV: 92.3 fL (ref 78.0–100.0)
PLATELETS: 154 10*3/uL (ref 150–400)
RBC: 4.65 MIL/uL (ref 4.22–5.81)
RDW: 12.7 % (ref 11.5–15.5)
WBC: 3.6 10*3/uL — AB (ref 4.0–10.5)

## 2015-10-07 LAB — BASIC METABOLIC PANEL
Anion gap: 4 — ABNORMAL LOW (ref 5–15)
BUN: 21 mg/dL — AB (ref 6–20)
CHLORIDE: 107 mmol/L (ref 101–111)
CO2: 27 mmol/L (ref 22–32)
CREATININE: 1.41 mg/dL — AB (ref 0.61–1.24)
Calcium: 9.9 mg/dL (ref 8.9–10.3)
GFR, EST AFRICAN AMERICAN: 54 mL/min — AB (ref >60)
GFR, EST NON AFRICAN AMERICAN: 46 mL/min — AB (ref >60)
Glucose, Bld: 96 mg/dL (ref 65–99)
Potassium: 4.9 mmol/L (ref 3.5–5.1)
SODIUM: 138 mmol/L (ref 135–145)

## 2015-10-07 LAB — ABO/RH: ABO/RH(D): A POS

## 2015-10-07 NOTE — Progress Notes (Signed)
09/23/2015-Pre-operative clearance from Dr. Quay Burow on chart.

## 2015-10-11 NOTE — H&P (Signed)
Reason For Visit Discuss surgery   Active Problems Problems  1. Benign prostatic hyperplasia with urinary obstruction (N40.1,N13.8) 2. Elevated prostate specific antigen (PSA) (R97.20) 3. Erectile dysfunction due to arterial insufficiency (N52.01) 4. Hypogonadism, testicular (E29.1) 5. Prostate nodule (N40.2) 6. Urinary urgency (R39.15)  History of Present Illness     81 yo Hopkins returns today for a consult to discuss surgery for an open prostatectomy further. ( on schedule for Oct 13, 2015).     He had gross hematuria x 9 days for which he had to go to the ER for after having urodynamics. He is now OFF Xarelto.    He has early dementia, a 76-month history of DVT in the right lower extremity, right arm, currently taking Xarelto. He is off Finasteride, but still takes Tamsulosin and Myrbetriq 50mg  for bladder outlet obstruction. but his international prostate symptom score has increased to 35/7, with a rising creatinine of 1.31. He has a firm prostate on examination, but negative biopsy in 2008. He is status post Prolieve procedure in January 2009.  He is off caffeine, and drinking lemonade only. His current PSA is 16.35. He has nocturia 2, severe weakening of his urinary stream. He has been treated with doxazosin for many years, 4 mg 1 at bedtime, but despite this, his symptoms have continued to worsen. He was intolerant of Avodart. In 2015, he had 4K urinalysis, showing 10% chance of cancer only. He has a history of elevated PSA of 18.2 and 2011 and 17.3 2012.    The patient currently is treated with Myrbetriq. He continues to complain of urinary frequency, urgency dysuria and nocturia incontinence and dribbling. He has a 4+ prostate on rectal examination.    Video urodynamics shows a maximum cystometric capacity of 280 cc, with first sensation and urge at 147 cc and 172 cc. The bladder becomes unstable, with maximum contraction of 70cm H20 and severe urinary leakage. He did not leak  for Valsalva leak point pressure. His pressure flow study shows a maximum flow rate of only 4 cc/s, at a detrusor pressure of 46 cm of water. The maximum detrusor pressure 66 cm of water pressure. PVR Raymond cc. This demonstrates a high pressure obstructive flow pattern. VCUG shows a trabeculated bladder with no reflux.    The prostate volume shows a length of 7.20, height of 5.53, with a 6.35, a prostate volume of 132.54 cc.   Past Medical History Problems  1. History of DVT of leg (deep venous thrombosis) (I82.409) 2. History of DVT of upper extremity (deep vein thrombosis) (I82.629) 3. History of Elevated prostate specific antigen (PSA) (R97.20) 4. History of anemia (Z86.2) 5. History of deep venous thrombosis (Z86.718) 6. History of esophageal reflux (Z87.19) 7. History of Hypogonadism, testicular (E29.1)  Surgical History Problems  1. History of Back Surgery 2. History of Needle Biopsy Of Prostate 3. History of Neuroplasty Median Nerve At Carpal Tunnel 4. History of Shoulder Surgery 5. History of Tonsillectomy 6. History of Wrist Surgery  Current Meds 1. AndroGel 50 MG/5GM PACK;  Therapy: (Recorded:11Jan2017) to Recorded 2. Aspirin 81 MG TABS;  Therapy: (Recorded:13Feb2017) to Recorded 3. CeleBREX 200 MG Oral Capsule;  Therapy: (Recorded:13Feb2017) to Recorded 4. Hydrocodone-Acetaminophen 7.5-325 MG Oral Tablet;  Therapy: (Recorded:11Nov2015) to Recorded 5. Iron TABS;  Therapy: (Recorded:08Apr2014) to Recorded 6. Magnesium 500 MG Oral Tablet;  Therapy: (Recorded:11Nov2015) to Recorded 7. Myrbetriq 50 MG Oral Tablet Extended Release 24 Hour; Take 1 tablet daily;  Therapy: 26Aug2016 to (Evaluate:20Jan2017); Last Rx:09Dec2016 Ordered 8.  Pantoprazole Sodium 40 MG Oral Tablet Delayed Release;  Therapy: (Recorded:28Oct2014) to Recorded 9. Tamsulosin HCl - 0.4 MG Oral Capsule; Take one tablet daily at bedtime;  Therapy: 08Apr2014 to (Last Rx:23Jan2017)  Requested for: 24Jan2017  Ordered 10. TraMADol HCl - 50 MG Oral Tablet;   Therapy: [phone] to Recorded 11. Vitamin B-12 500 MCG Oral Tablet;   Therapy: (Recorded:11Nov2015) to Recorded 12. Vitamin B-6 TABS;   Therapy: (Recorded:11Jan2017) to Recorded 13. Vitamin D3 1000 UNIT Oral Capsule;   Therapy: (Recorded:11Nov2015) to Recorded  Allergies Medication  1. No Known Drug Allergies  Family History Problems  1. Family history of Family Health Status Number Of Children 2. Family history of Father Deceased At Age ___ 3. Family history of Mother Deceased At Age ___  Social History Problems    Denied: Alcohol Use (History)   Caffeine Use   Former Smoker   Marital History - Currently Married  Review of Systems Genitourinary, constitutional, skin, eye, otolaryngeal, hematologic/lymphatic, cardiovascular, pulmonary, endocrine, musculoskeletal, gastrointestinal, neurological and psychiatric system(s) were reviewed and pertinent findings if present are noted and are otherwise negative.  Genitourinary: urinary frequency, feelings of urinary urgency, dysuria, nocturia, incontinence, post-void dribbling, hematuria and erectile dysfunction, but no difficulty starting the urinary stream, urine stream is not weak, urinary stream does not start and stop, no incomplete emptying of bladder and initiating urination does not require straining.    Vitals Vital Signs [Data Includes: Last 1 Day]  Recorded: 10Apr2017 05:05PM  Blood Pressure: 141 / 71 Heart Rate: 59  Physical Exam Constitutional: Well nourished and well developed . No acute distress.  ENT:. The ears and nose are normal in appearance.  Pulmonary: No respiratory distress and normal respiratory rhythm and effort.  Abdomen: The abdomen is soft and nontender. No masses are palpated. No CVA tenderness. No hernias are palpable. No hepatosplenomegaly noted.  Rectal: Rectal exam demonstrates normal sphincter tone, no tenderness and no masses. Estimated prostate  size is 4+. Normal rectal tone, no rectal masses, prostate is smooth, symmetric and non-tender. The prostate has a palpable nodule involving the entire left lobe of the prostate which appears to be confined within the prostate capsule and is not tender. The left seminal vesicle is nonpalpable. The right seminal vesicle is nonpalpable. The perineum is normal on inspection.  Genitourinary: Examination of the penis demonstrates no discharge, no masses, no lesions and a normal meatus. The scrotum is without lesions. The right epididymis is palpably normal and non-tender. The left epididymis is palpably normal and non-tender. The right testis is non-tender and without masses. The left testis is non-tender and without masses.  Lymphatics: The femoral and inguinal nodes are not enlarged or tender.  Skin: Normal skin turgor, no visible rash and no visible skin lesions.  Neuro/Psych:. Mood and affect are appropriate.    Results/Data Urine [Data Includes: Last 1 Day]   10Apr2017  COLOR YELLOW   APPEARANCE CLEAR   SPECIFIC GRAVITY 1.020   pH 5.5   GLUCOSE NEGATIVE   BILIRUBIN NEGATIVE   KETONE NEGATIVE   BLOOD NEGATIVE   PROTEIN NEGATIVE   NITRITE NEGATIVE   LEUKOCYTE ESTERASE NEGATIVE    Assessment Assessed  1. Benign prostatic hyperplasia with urinary obstruction (N40.1,N13.8) 2. Urinary urgency (R39.15) 3. Elevated prostate specific antigen (PSA) (R97.20)  1 hr and 15 minutes   1. If no cancer, then he will be able to use Testosterone.   2. Stop T now.   3. Stop meds that make him bleed. ( ASA, celebrex,  motrin, fish oil).   4. Will not need tamsulosin post op. Continue until surgery.   5. Probably will need Myrbetriq after surgery.   Plan Benign prostatic hyperplasia with urinary obstruction  1. Renew: Tamsulosin HCl - 0.4 MG Oral Capsule; TAKE ONE CAPSULE AT BEDTIME Benign prostatic hyperplasia with urinary obstruction, Urinary urgency  2. PT/OT Referral Referral  Referral: note  Pt has 2 daughters that are PTs.  Status: Hold For  - Appointment,PreCert,Date of Service,Physical Therapy  Requested for: 10Apr2017 Health Maintenance  3. UA With REFLEX; [Do Not Release]; Status:Resulted - Requires Verification;   Done:  [id number] 03:54PM  1. PT consult: (Surgery May 22.)   2, Renew tamsulosin for nolw  3. Myrbetriq samples., wife will ck with Maudie Mercury.   Discussion/Summary cc: Dr. Antony Blackbird     Signatures Electronically signed by : Carolan Clines, M.D.; Sep 01 2015  6:20PM EST

## 2015-10-13 ENCOUNTER — Encounter (HOSPITAL_COMMUNITY)
Admission: RE | Disposition: A | Discharge status: Home / Self Care | Payer: Self-pay | Source: Ambulatory Visit | Attending: Urology

## 2015-10-13 ENCOUNTER — Inpatient Hospital Stay (HOSPITAL_COMMUNITY): Payer: Commercial Managed Care - HMO | Admitting: Anesthesiology

## 2015-10-13 ENCOUNTER — Encounter (HOSPITAL_COMMUNITY): Payer: Self-pay

## 2015-10-13 ENCOUNTER — Inpatient Hospital Stay (HOSPITAL_COMMUNITY)
Admission: RE | Admit: 2015-10-13 | Discharge: 2015-10-15 | DRG: 708 | Disposition: A | Discharge status: Home / Self Care | Payer: Commercial Managed Care - HMO | Source: Ambulatory Visit | Attending: Urology | Admitting: Urology

## 2015-10-13 DIAGNOSIS — F0390 Unspecified dementia without behavioral disturbance: Secondary | ICD-10-CM | POA: Diagnosis not present

## 2015-10-13 DIAGNOSIS — N400 Enlarged prostate without lower urinary tract symptoms: Secondary | ICD-10-CM | POA: Diagnosis not present

## 2015-10-13 DIAGNOSIS — Z7982 Long term (current) use of aspirin: Secondary | ICD-10-CM | POA: Diagnosis not present

## 2015-10-13 DIAGNOSIS — N5201 Erectile dysfunction due to arterial insufficiency: Secondary | ICD-10-CM | POA: Diagnosis present

## 2015-10-13 DIAGNOSIS — Z87891 Personal history of nicotine dependence: Secondary | ICD-10-CM | POA: Diagnosis not present

## 2015-10-13 DIAGNOSIS — C61 Malignant neoplasm of prostate: Secondary | ICD-10-CM | POA: Diagnosis not present

## 2015-10-13 DIAGNOSIS — N3289 Other specified disorders of bladder: Secondary | ICD-10-CM | POA: Diagnosis not present

## 2015-10-13 DIAGNOSIS — E291 Testicular hypofunction: Secondary | ICD-10-CM | POA: Diagnosis present

## 2015-10-13 DIAGNOSIS — Z86718 Personal history of other venous thrombosis and embolism: Secondary | ICD-10-CM

## 2015-10-13 DIAGNOSIS — R3915 Urgency of urination: Secondary | ICD-10-CM | POA: Diagnosis not present

## 2015-10-13 DIAGNOSIS — N401 Enlarged prostate with lower urinary tract symptoms: Secondary | ICD-10-CM | POA: Diagnosis not present

## 2015-10-13 DIAGNOSIS — N138 Other obstructive and reflux uropathy: Secondary | ICD-10-CM | POA: Diagnosis not present

## 2015-10-13 HISTORY — PX: PROSTATECTOMY: SHX69

## 2015-10-13 LAB — CBC
HCT: 35.8 % — ABNORMAL LOW (ref 39.0–52.0)
Hemoglobin: 12.5 g/dL — ABNORMAL LOW (ref 13.0–17.0)
MCH: 32 pg (ref 26.0–34.0)
MCHC: 34.9 g/dL (ref 30.0–36.0)
MCV: 91.6 fL (ref 78.0–100.0)
PLATELETS: 134 10*3/uL — AB (ref 150–400)
RBC: 3.91 MIL/uL — AB (ref 4.22–5.81)
RDW: 12.8 % (ref 11.5–15.5)
WBC: 8.8 10*3/uL (ref 4.0–10.5)

## 2015-10-13 LAB — BASIC METABOLIC PANEL
ANION GAP: 5 (ref 5–15)
BUN: 16 mg/dL (ref 6–20)
CO2: 26 mmol/L (ref 22–32)
Calcium: 9.1 mg/dL (ref 8.9–10.3)
Chloride: 107 mmol/L (ref 101–111)
Creatinine, Ser: 1.35 mg/dL — ABNORMAL HIGH (ref 0.61–1.24)
GFR calc Af Amer: 56 mL/min — ABNORMAL LOW (ref >60)
GFR, EST NON AFRICAN AMERICAN: 49 mL/min — AB (ref >60)
GLUCOSE: 149 mg/dL — AB (ref 65–99)
POTASSIUM: 3.9 mmol/L (ref 3.5–5.1)
Sodium: 138 mmol/L (ref 135–145)

## 2015-10-13 SURGERY — PROSTATECTOMY, RETROPUBIC
Anesthesia: General | Site: Abdomen

## 2015-10-13 MED ORDER — ONDANSETRON HCL 4 MG/2ML IJ SOLN
INTRAMUSCULAR | Status: AC
  Filled 2015-10-13: qty 2

## 2015-10-13 MED ORDER — LACTATED RINGERS IV SOLN
INTRAVENOUS | Status: DC
  Administered 2015-10-13 (×3): via INTRAVENOUS

## 2015-10-13 MED ORDER — HYDROMORPHONE HCL 1 MG/ML IJ SOLN
0.2500 mg | INTRAMUSCULAR | Status: DC | PRN
  Administered 2015-10-13 (×2): 0.5 mg via INTRAVENOUS

## 2015-10-13 MED ORDER — ONDANSETRON HCL 4 MG/2ML IJ SOLN
4.0000 mg | INTRAMUSCULAR | Status: DC | PRN
  Administered 2015-10-13 (×2): 4 mg via INTRAVENOUS
  Filled 2015-10-13: qty 2

## 2015-10-13 MED ORDER — LIDOCAINE HCL (CARDIAC) 20 MG/ML IV SOLN
INTRAVENOUS | Status: DC | PRN
  Administered 2015-10-13: 100 mg via INTRAVENOUS

## 2015-10-13 MED ORDER — PANTOPRAZOLE SODIUM 40 MG PO TBEC
40.0000 mg | DELAYED_RELEASE_TABLET | Freq: Every day | ORAL | Status: DC
  Administered 2015-10-14 – 2015-10-15 (×2): 40 mg via ORAL
  Filled 2015-10-13 (×2): qty 1

## 2015-10-13 MED ORDER — SUGAMMADEX SODIUM 200 MG/2ML IV SOLN
INTRAVENOUS | Status: AC
  Filled 2015-10-13: qty 2

## 2015-10-13 MED ORDER — CEFAZOLIN SODIUM-DEXTROSE 2-4 GM/100ML-% IV SOLN
2.0000 g | INTRAVENOUS | Status: AC
  Administered 2015-10-13: 2 g via INTRAVENOUS

## 2015-10-13 MED ORDER — ROCURONIUM BROMIDE 50 MG/5ML IV SOLN
INTRAVENOUS | Status: AC
  Filled 2015-10-13: qty 1

## 2015-10-13 MED ORDER — SUCCINYLCHOLINE CHLORIDE 20 MG/ML IJ SOLN
INTRAMUSCULAR | Status: DC | PRN
  Administered 2015-10-13: 100 mg via INTRAVENOUS

## 2015-10-13 MED ORDER — FENTANYL CITRATE (PF) 250 MCG/5ML IJ SOLN
INTRAMUSCULAR | Status: AC
  Filled 2015-10-13: qty 5

## 2015-10-13 MED ORDER — OXYCODONE HCL 5 MG PO TABS: 5.0000 mg | ORAL_TABLET | Freq: Once | ORAL | Status: DC | PRN

## 2015-10-13 MED ORDER — FLUTICASONE PROPIONATE 50 MCG/ACT NA SUSP: 1.0000 | Freq: Every day | NASAL | Status: DC

## 2015-10-13 MED ORDER — FENTANYL CITRATE (PF) 100 MCG/2ML IJ SOLN
INTRAMUSCULAR | Status: DC | PRN
  Administered 2015-10-13 (×3): 50 ug via INTRAVENOUS

## 2015-10-13 MED ORDER — BELLADONNA ALKALOIDS-OPIUM 16.2-60 MG RE SUPP: 1.0000 | Freq: Four times a day (QID) | RECTAL | Status: DC | PRN

## 2015-10-13 MED ORDER — ACETAMINOPHEN 500 MG PO TABS
1000.0000 mg | ORAL_TABLET | Freq: Four times a day (QID) | ORAL | Status: DC
  Administered 2015-10-13 – 2015-10-14 (×3): 1000 mg via ORAL
  Filled 2015-10-13 (×3): qty 2

## 2015-10-13 MED ORDER — LORATADINE 10 MG PO TABS: 10.0000 mg | ORAL_TABLET | Freq: Every day | ORAL | Status: DC | PRN

## 2015-10-13 MED ORDER — HYDROMORPHONE HCL 1 MG/ML IJ SOLN
INTRAMUSCULAR | Status: AC
  Filled 2015-10-13: qty 1

## 2015-10-13 MED ORDER — CEFAZOLIN SODIUM-DEXTROSE 2-4 GM/100ML-% IV SOLN
INTRAVENOUS | Status: AC
  Filled 2015-10-13: qty 100

## 2015-10-13 MED ORDER — ROCURONIUM BROMIDE 100 MG/10ML IV SOLN
INTRAVENOUS | Status: DC | PRN
  Administered 2015-10-13: 20 mg via INTRAVENOUS
  Administered 2015-10-13: 10 mg via INTRAVENOUS
  Administered 2015-10-13: 40 mg via INTRAVENOUS

## 2015-10-13 MED ORDER — ONDANSETRON HCL 4 MG/2ML IJ SOLN
INTRAMUSCULAR | Status: DC | PRN
  Administered 2015-10-13: 4 mg via INTRAVENOUS

## 2015-10-13 MED ORDER — PROPOFOL 10 MG/ML IV BOLUS
INTRAVENOUS | Status: DC | PRN
  Administered 2015-10-13: 150 mg via INTRAVENOUS
  Administered 2015-10-13: 50 mg via INTRAVENOUS

## 2015-10-13 MED ORDER — DIPHENHYDRAMINE HCL 12.5 MG/5ML PO ELIX: 12.5000 mg | ORAL_SOLUTION | Freq: Four times a day (QID) | ORAL | Status: DC | PRN

## 2015-10-13 MED ORDER — HYOSCYAMINE SULFATE 0.125 MG SL SUBL
0.2500 mg | SUBLINGUAL_TABLET | SUBLINGUAL | Status: DC | PRN
  Filled 2015-10-13: qty 2

## 2015-10-13 MED ORDER — PROPOFOL 10 MG/ML IV BOLUS
INTRAVENOUS | Status: AC
  Filled 2015-10-13: qty 20

## 2015-10-13 MED ORDER — SUGAMMADEX SODIUM 200 MG/2ML IV SOLN
INTRAVENOUS | Status: DC | PRN
  Administered 2015-10-13: 200 mg via INTRAVENOUS

## 2015-10-13 MED ORDER — DIPHENHYDRAMINE HCL 50 MG/ML IJ SOLN: 12.5000 mg | Freq: Four times a day (QID) | INTRAMUSCULAR | Status: DC | PRN

## 2015-10-13 MED ORDER — LIDOCAINE HCL (CARDIAC) 20 MG/ML IV SOLN
INTRAVENOUS | Status: AC
  Filled 2015-10-13: qty 5

## 2015-10-13 MED ORDER — HYDROMORPHONE HCL 1 MG/ML IJ SOLN
0.5000 mg | INTRAMUSCULAR | Status: DC | PRN
  Administered 2015-10-13 – 2015-10-14 (×2): 0.5 mg via INTRAVENOUS
  Filled 2015-10-13 (×2): qty 1

## 2015-10-13 MED ORDER — MEPERIDINE HCL 50 MG/ML IJ SOLN: 6.2500 mg | INTRAMUSCULAR | Status: DC | PRN

## 2015-10-13 MED ORDER — BUPIVACAINE LIPOSOME 1.3 % IJ SUSP
20.0000 mL | Freq: Once | INTRAMUSCULAR | Status: AC
  Administered 2015-10-13: 20 mL
  Filled 2015-10-13: qty 20

## 2015-10-13 MED ORDER — CEFAZOLIN SODIUM 1-5 GM-% IV SOLN
1.0000 g | Freq: Three times a day (TID) | INTRAVENOUS | Status: AC
  Administered 2015-10-13 – 2015-10-14 (×2): 1 g via INTRAVENOUS
  Filled 2015-10-13 (×2): qty 50

## 2015-10-13 MED ORDER — OXYCODONE HCL 5 MG/5ML PO SOLN: 5.0000 mg | Freq: Once | ORAL | Status: DC | PRN

## 2015-10-13 MED ORDER — VITAMIN B-6 100 MG PO TABS
100.0000 mg | ORAL_TABLET | Freq: Every day | ORAL | Status: DC
  Administered 2015-10-14: 100 mg via ORAL
  Filled 2015-10-13 (×2): qty 1

## 2015-10-13 MED ORDER — POLYETHYLENE GLYCOL 3350 17 G PO PACK
17.0000 g | PACK | Freq: Every day | ORAL | Status: DC
  Administered 2015-10-13 – 2015-10-15 (×3): 17 g via ORAL
  Filled 2015-10-13 (×3): qty 1

## 2015-10-13 MED ORDER — LIDOCAINE 5 % EX PTCH
1.0000 | MEDICATED_PATCH | CUTANEOUS | Status: DC
  Administered 2015-10-13 – 2015-10-14 (×2): 1 via TRANSDERMAL
  Filled 2015-10-13 (×3): qty 1

## 2015-10-13 MED ORDER — SENNA 8.6 MG PO TABS
1.0000 | ORAL_TABLET | Freq: Two times a day (BID) | ORAL | Status: DC
  Administered 2015-10-13 – 2015-10-15 (×4): 8.6 mg via ORAL
  Filled 2015-10-13 (×4): qty 1

## 2015-10-13 MED ORDER — OXYCODONE HCL 5 MG PO TABS
5.0000 mg | ORAL_TABLET | ORAL | Status: DC | PRN
  Administered 2015-10-14 – 2015-10-15 (×3): 5 mg via ORAL
  Filled 2015-10-13 (×3): qty 1

## 2015-10-13 MED ORDER — DEXAMETHASONE SODIUM PHOSPHATE 10 MG/ML IJ SOLN
INTRAMUSCULAR | Status: DC | PRN
  Administered 2015-10-13: 10 mg via INTRAVENOUS

## 2015-10-13 MED ORDER — DEXTROSE IN LACTATED RINGERS 5 % IV SOLN
INTRAVENOUS | Status: DC
Start: 2015-10-13 — End: 2015-10-14
  Administered 2015-10-13 – 2015-10-14 (×2): via INTRAVENOUS

## 2015-10-13 MED ORDER — 0.9 % SODIUM CHLORIDE (POUR BTL) OPTIME
TOPICAL | Status: DC | PRN
  Administered 2015-10-13: 3000 mL

## 2015-10-13 MED ORDER — MIRABEGRON ER 50 MG PO TB24
50.0000 mg | ORAL_TABLET | Freq: Every evening | ORAL | Status: DC
  Administered 2015-10-13 – 2015-10-14 (×2): 50 mg via ORAL
  Filled 2015-10-13 (×3): qty 1

## 2015-10-13 MED ORDER — SODIUM CHLORIDE 0.9 % IJ SOLN
INTRAMUSCULAR | Status: AC
  Filled 2015-10-13: qty 20

## 2015-10-13 MED ORDER — DEXAMETHASONE SODIUM PHOSPHATE 10 MG/ML IJ SOLN
INTRAMUSCULAR | Status: AC
  Filled 2015-10-13: qty 1

## 2015-10-13 SURGICAL SUPPLY — 55 items
BAG URINE DRAINAGE (UROLOGICAL SUPPLIES) ×3 IMPLANT
BLADE EXTENDED COATED 6.5IN (ELECTRODE) ×3 IMPLANT
BLADE HEX COATED 2.75 (ELECTRODE) ×3 IMPLANT
BLADE SURG 15 STRL LF DISP TIS (BLADE) ×1 IMPLANT
BLADE SURG 15 STRL SS (BLADE) ×3
CATH FOLEY 2WAY SLVR 30CC 22FR (CATHETERS) ×3 IMPLANT
CATH SILASTIC FOLEY 20FRX30CC (CATHETERS) ×3 IMPLANT
CLIP LIGATING HEM O LOK PURPLE (MISCELLANEOUS) ×13 IMPLANT
CLIP LIGATING HEMO O LOK GREEN (MISCELLANEOUS) ×5 IMPLANT
COVER BACK TABLE 60X90IN (DRAPES) ×3 IMPLANT
COVER SURGICAL LIGHT HANDLE (MISCELLANEOUS) ×3 IMPLANT
DISSECTOR ROUND CHERRY 3/8 STR (MISCELLANEOUS) ×1 IMPLANT
DRAIN CHANNEL 10F 3/8 F FF (DRAIN) ×3 IMPLANT
DRAPE LAPAROTOMY T 102X78X121 (DRAPES) ×1 IMPLANT
DRAPE UTILITY 15X26 (DRAPE) ×3 IMPLANT
DRAPE WARM FLUID 44X44 (DRAPE) ×3 IMPLANT
ELECT REM PT RETURN 9FT ADLT (ELECTROSURGICAL) ×3
ELECTRODE REM PT RTRN 9FT ADLT (ELECTROSURGICAL) ×1 IMPLANT
EVACUATOR SILICONE 100CC (DRAIN) ×3 IMPLANT
GAUZE SPONGE 4X4 12PLY STRL (GAUZE/BANDAGES/DRESSINGS) ×3 IMPLANT
GAUZE SPONGE 4X4 16PLY XRAY LF (GAUZE/BANDAGES/DRESSINGS) ×4 IMPLANT
GLOVE BIOGEL M STRL SZ7.5 (GLOVE) ×3 IMPLANT
GOWN STRL REUS W/TWL XL LVL3 (GOWN DISPOSABLE) ×3 IMPLANT
HEMOSTAT SURGICEL 2X14 (HEMOSTASIS) ×2 IMPLANT
HOLDER FOLEY CATH W/STRAP (MISCELLANEOUS) ×3 IMPLANT
KIT BASIN OR (CUSTOM PROCEDURE TRAY) ×3 IMPLANT
NS IRRIG 1000ML POUR BTL (IV SOLUTION) ×6 IMPLANT
PACK GENERAL/GYN (CUSTOM PROCEDURE TRAY) ×3 IMPLANT
PLUG CATH AND CAP STER (CATHETERS) ×3 IMPLANT
SCRUB PCMX 4 OZ (MISCELLANEOUS) ×3 IMPLANT
SPONGE LAP 18X18 X RAY DECT (DISPOSABLE) ×6 IMPLANT
SPONGE LAP 4X18 X RAY DECT (DISPOSABLE) ×1 IMPLANT
STAPLER VISISTAT 35W (STAPLE) ×3 IMPLANT
SURGIFLO W/THROMBIN 8M KIT (HEMOSTASIS) ×2 IMPLANT
SURGILUBE 3G PEEL PACK STRL (MISCELLANEOUS) ×15 IMPLANT
SUT ETHILON 3 0 PS 1 (SUTURE) ×3 IMPLANT
SUT PDS AB 1 CTX 36 (SUTURE) ×4 IMPLANT
SUT PDS AB 1 TP1 96 (SUTURE) ×4 IMPLANT
SUT SILK 0 (SUTURE) ×3
SUT SILK 0 30XBRD TIE 6 (SUTURE) ×2 IMPLANT
SUT VIC AB 0 CT1 27 (SUTURE) ×9
SUT VIC AB 0 CT1 27XBRD ANTBC (SUTURE) ×3 IMPLANT
SUT VIC AB 0 UR5 27 (SUTURE) ×6 IMPLANT
SUT VIC AB 2-0 SH 27 (SUTURE) ×3
SUT VIC AB 2-0 SH 27X BRD (SUTURE) IMPLANT
SUT VIC AB 2-0 UR5 27 (SUTURE) ×10 IMPLANT
SUT VIC AB 2-0 UR6 27 (SUTURE) ×4 IMPLANT
SUT VIC AB 4-0 RB1 27 (SUTURE)
SUT VIC AB 4-0 RB1 27XBRD (SUTURE) ×3 IMPLANT
SYR 30ML LL (SYRINGE) ×3 IMPLANT
TAPE CLOTH SURG 4X10 WHT LF (GAUZE/BANDAGES/DRESSINGS) ×3 IMPLANT
TAPE UMBILICAL COTTON 1/8X30 (MISCELLANEOUS) ×1 IMPLANT
TOWEL OR 17X26 10 PK STRL BLUE (TOWEL DISPOSABLE) ×4 IMPLANT
TOWEL OR NON WOVEN STRL DISP B (DISPOSABLE) ×3 IMPLANT
WATER STERILE IRR 1500ML POUR (IV SOLUTION) ×3 IMPLANT

## 2015-10-13 NOTE — Interval H&P Note (Signed)
History and Physical Interval Note:  10/13/2015 9:24 AM  Raymond Hopkins  has presented today for surgery, with the diagnosis of BENIGN PROSTATIC HYPERPLASIA  The various methods of treatment have been discussed with the patient and family. After consideration of risks, benefits and other options for treatment, the patient has consented to  Procedure(s): SIMPLE OPEN RETROPUBIC PROSTATECTOMY  (N/A) as a surgical intervention .  The patient's history has been reviewed, patient examined, no change in status, stable for surgery.  I have reviewed the patient's chart and labs.  Questions were answered to the patient's satisfaction.     Eliseo Withers I Demontez Novack

## 2015-10-13 NOTE — Progress Notes (Signed)
Day of Surgery   Subjective: Patient reports pain control good. Denies N/V, flatus/BMs. Tolerated a clears tray. AF, VSS. Hgb 12.5 from 14.6 pre-op. Cr stable. 750 mL thin dark red urine since surgery. JP with only 30 cc SS output.   Objective: Vital signs in last 24 hours: Temp:  [97.5 F (36.4 C)-98.2 F (36.8 C)] 98.2 F (36.8 C) (05/22 1521) Pulse Rate:  [61-85] 82 (05/22 1521) Resp:  [0-23] 20 (05/22 1521) BP: (122-151)/(61-88) 140/88 mmHg (05/22 1521) SpO2:  [99 %-100 %] 100 % (05/22 1521)  Intake/Output from previous day:   Intake/Output this shift: Total I/O In: 2600 [I.V.:2600] Out: 1130 [Urine:750; Drains:30; Blood:350]  Physical Exam:  General:alert, cooperative and appears stated age GI: Soft, non-distended, appropriately tender to palpation. Infraumbilical mid-line incision with staples and island dressing which is C/D/I. JP drain with small amount of SS output.  Male genitalia: Foley catheter in place with thin dark red urine without clots. Draining well. Resp: No increased WOB on  Extremities: extremities normal, atraumatic, no cyanosis or edema  Lab Results:  Recent Labs  10/13/15 1247  HGB 12.5*  HCT 35.8*   BMET  Recent Labs  10/13/15 1247  NA 138  K 3.9  CL 107  CO2 26  GLUCOSE 149*  BUN 16  CREATININE 1.35*  CALCIUM 9.1   No results for input(s): LABPT, INR in the last 72 hours. No results for input(s): LABURIN in the last 72 hours. No results found for this or any previous visit.  Studies/Results: No results found.  Assessment/Plan: Day of Surgery Procedure(s) (LRB): SIMPLE OPEN RETROPUBIC PROSTATECTOMY  (N/A)    Clears  mIVF @ 100  Scheduled tylenol  Lidocaine patches  PRN dilaudid IV & oxycodone  Foley catheter to gravity - hand irrigate PRN  Levsin PRN bladder spasms  Holding Xarelto and ASA 81 mg   LOS: 0 days   Acie Fredrickson 10/13/2015, 5:04 PM

## 2015-10-13 NOTE — Anesthesia Postprocedure Evaluation (Signed)
Anesthesia Post Note  Patient: LANDIS VARON  Procedure(s) Performed: Procedure(s) (LRB): SIMPLE OPEN RETROPUBIC PROSTATECTOMY  (N/A)  Patient location during evaluation: PACU Anesthesia Type: General Level of consciousness: awake and alert Pain management: pain level controlled Vital Signs Assessment: post-procedure vital signs reviewed and stable Respiratory status: spontaneous breathing, nonlabored ventilation, respiratory function stable and patient connected to face mask oxygen Cardiovascular status: blood pressure returned to baseline and stable Postop Assessment: no signs of nausea or vomiting Anesthetic complications: no    Last Vitals:  Filed Vitals:   10/13/15 1245 10/13/15 1249  BP: 126/71   Pulse: 77 82  Temp:    Resp: 16 15    Last Pain:  Filed Vitals:   10/13/15 1304  PainSc: 0-No pain                 Easton Fetty A

## 2015-10-13 NOTE — Anesthesia Preprocedure Evaluation (Addendum)
Anesthesia Evaluation  Patient identified by MRN, date of birth, ID band Patient awake    Reviewed: Allergy & Precautions, NPO status , Patient's Chart, lab work & pertinent test results  Airway Mallampati: I  TM Distance: >3 FB Neck ROM: Full    Dental  (+) Teeth Intact, Dental Advisory Given   Pulmonary former smoker,    breath sounds clear to auscultation       Cardiovascular + Peripheral Vascular Disease   Rhythm:Regular Rate:Normal     Neuro/Psych    GI/Hepatic   Endo/Other    Renal/GU      Musculoskeletal   Abdominal   Peds  Hematology   Anesthesia Other Findings   Reproductive/Obstetrics                            Anesthesia Physical Anesthesia Plan  ASA: II  Anesthesia Plan: General   Post-op Pain Management:    Induction: Intravenous  Airway Management Planned: Oral ETT  Additional Equipment:   Intra-op Plan:   Post-operative Plan: Extubation in OR  Informed Consent: I have reviewed the patients History and Physical, chart, labs and discussed the procedure including the risks, benefits and alternatives for the proposed anesthesia with the patient or authorized representative who has indicated his/her understanding and acceptance.   Dental advisory given  Plan Discussed with: CRNA, Anesthesiologist and Surgeon  Anesthesia Plan Comments:        Anesthesia Quick Evaluation

## 2015-10-13 NOTE — Progress Notes (Signed)
CBC and B met results noted

## 2015-10-13 NOTE — Transfer of Care (Signed)
Immediate Anesthesia Transfer of Care Note  Patient: Raymond Hopkins  Procedure(s) Performed: Procedure(s): SIMPLE OPEN RETROPUBIC PROSTATECTOMY  (N/A)  Patient Location: PACU  Anesthesia Type:General  Level of Consciousness: sedated  Airway & Oxygen Therapy: Patient Spontanous Breathing and Patient connected to face mask oxygen  Post-op Assessment: Report given to RN and Post -op Vital signs reviewed and stable  Post vital signs: Reviewed and stable  Last Vitals:  Filed Vitals:   10/13/15 0736  BP: 124/87  Pulse: 61  Temp: 36.4 C  Resp: 16    Last Pain: There were no vitals filed for this visit.    Patients Stated Pain Goal: 3 ([id number])  Complications: No apparent anesthesia complications

## 2015-10-13 NOTE — Progress Notes (Signed)
CBC  And B met drawn by lab.

## 2015-10-13 NOTE — Op Note (Signed)
Pre-operative diagnosis:  1. BPH with obstruction   Postoperative diagnosis: same  Procedures performed:  1. Open trans-capsular suprapubic simple prostatectomy   Attending surgeon: Carolan Clines, MD  Resident surgeons: E. Harvel Ricks, MD  Indications for surgery: Raymond Hopkins is a very pleasant 81 year old gentleman who has a history of benign prostatic hyperplasia and Imaging demonstrates an enlarged prostate (132 gram). After extensive counseling and discussion of all option, he elected to undergo a open simple prostatectomy. All risks and benefits of the surgical adequately discussed to the patient's satisfaction. Informed consent was signed and witnessed.   Complications: None  Estimated blood loss: 350 mL   Anesthesia: Gen. via endotracheal intubation   Specimen: Prostatic adenoma sent for final pathology  Drains:  1. 22 F 2-way straight foley catheter  2. JP drain   Operative findings:  1. Prostatic adenoma was removed en bloc 3. Excellent hemostasis at the conclusion of the case 4. Urine was thin light pink at the conclusion of the case  Procedure:  Raymond Hopkins was identified in the perioperative holding area. His H&P was reviewed and he was appropriately site marked. He was then brought back to operating room. He was placed supine on the operating room table and general anesthesia was administered via endotracheal intubation after preinduction timeout. SCDs were placed for DVT prophylaxis.  Perioperative antibiotics were provided. Patient was prepped and draped in the usual sterile fashion. Repeat timeout was performed. Foley catheter was placed sterilely on the table.   Marking pen was used to mark out a lower midline incision from ~3 cm distal to the inferior margin of the umbilicus to the pubic symphysis. We then used a 15 blade scalpel to incise the skin and then used bovie electrocautery to incise the subcutaneous tissues down to the fascia. We then used a  combination of sharp and blunt dissection to open the fascia in the midline. We cleaned off the underside of the fascia bluntly. Staying in the midline we split the two bellies of the rectus muscle and retracted them laterally. We then sharply opened the transervalis fascia.    Next, we filled the bladder with 300 mL of saline. Using blunt dissection with the assistance of a lap pad we gently developed the space of retzius anterior to the bladder making sure to avoid entry into the peritoneum. Blunt dissection was performed lateral to the bladder to expose the iliac vessels. We then placed the omni retractor in place with excellent exposure.  Two medium body walls were placed laterally on each side of the bladder and a Y-shaped retractor was placed cranially to retract both later aspects of the bladder . We then used bovie electrocautery to de-fat the prostatic capsule. We then used bovie to demarcate both our bladder neck for future reference and next our intended capsular incision. We then used a 0-0 Vicryl on a CT1 to preplace a horizontal suture on each side of the capsular incision. Each suture was tied to the capsule and a hemostat was applied. We then used electrocautery to enter the capsule through the previously demarcated line.   We then used heavy curved Mayo scissors to enter the space between the prostatic capsule and adenoma anteriorly. We then inserted our index finger between the anterior adenoma and the capsule and began coring out the adenoma using blunt dissection to break the adhesions between the adenoma and capsule anteriorly. We were then able to insert our entire hand into the space between the adenoma and  capsule and eventually we were able to dissect the posterior and proximal aspects adenoma off the capsule. We then bluntly pinched off the distal urethra from the adenoma and then retracted the adenoma superiorly out of the pelvis and finally broke the last posterior adhesions to the  capsule. We then took down the foley balloon and removed the foley catheter. Finally, using a combination of sharp and blunt dissection we dissected the adenoma off the bladder neck and removed the adenoma. The specimen was passed off.  We carefully passed the foley catheter from the prostatic capsule and guided it into the bladder next and placed 30 mL sterile water in the balloon. We irrigated the bladder several times with no large clots or significant hematuria notes. Next, we noted a flap of prostatic urethral mucosa eminating from the anterior bladder neck. This was sharply trimmed. We also noted a smaller flap of mucosa eminating from the posterior bladder neck. This was anastomosed to the posterior aspect of the inside of the capsule at the 5 and 7 o'clock positions using an 0-0 Vicryl on a CT1 in an interrputed fashion.  This helped to re-approximate the bladder neck to the urethra and prostatic capsule and prevent a bladder neck contracture.   Next, we inserted a hemostatic agent into the prostatic capsule, placed the foley catheter on traction and packed the pelvis with a lap and left this for 5 minutes for hemostasis. Following this we noted excellent hemostasis. We then proceeded to close the prostatic capsule. We started laterally on each side and ran the capsule medially with a 0-0 Vicryl in a simple locking and running fashion until both sutures met and were tied together. Next, we removed the hemostats from the pre-placed sutures and cut each of these. We did placed a single interrupted figure of eight across the capsule incision for reinforcement for additional support of the capsular closure.  Urine draining from the foley was noted to be light pink. Irrigation demonstrated no leakage of fluid from the capsular incision and there was excellent hemostasis. We then proceeded with JP drain placement in the left lower quadrant. The JP drain was placed over the prostate and the drain was secured  to the skin with a drain stitch.  At this time we proceeded with closing. We re-approximated the rectus muscle bellies loosely with several interrupted 2-0 vicryl sutures. We then closed the anterior rectus fascia with a 0-0 looped PDS. We irrigated the incision and then we then closed the subcutaneous tissue with multiple interrupted 2-0 Vicryls. Finally, the skin was closed with staples in the standard fashion. The incision was cleaned and covered with an island dressing.   We once again irrigated the bladder and the draining urine was light pink. At the conclusion of the case the urine was clear without CBI.   All laps, suture counts, and instrument counts were correct 2. Anesthesia was reversed and the patient was taken the recovery area in stable condition.   Postoperative plan  1. Patient to be on the urology service for ongoing postoperative care with foley catheter drainage.   Attending attestation: Dr. Gaynelle Arabian was present, scrubbed, and participated in the entirety of the case.  Urology Attending Note: I was present for, and participated in, all aspects of this patient's surgical care.

## 2015-10-13 NOTE — Anesthesia Procedure Notes (Signed)
Procedure Name: Intubation Date/Time: 10/13/2015 9:30 AM Performed by: Lind Covert Pre-anesthesia Checklist: Patient identified, Timeout performed, Emergency Drugs available, Suction available and Patient being monitored Patient Re-evaluated:Patient Re-evaluated prior to inductionOxygen Delivery Method: Circle system utilized Preoxygenation: Pre-oxygenation with 100% oxygen Intubation Type: IV induction Laryngoscope Size: Mac and 4 Grade View: Grade I Tube type: Oral Tube size: 8.0 mm Number of attempts: 1 Airway Equipment and Method: Stylet Placement Confirmation: ETT inserted through vocal cords under direct vision,  breath sounds checked- equal and bilateral and positive ETCO2 Secured at: 22 cm Tube secured with: Tape Dental Injury: Teeth and Oropharynx as per pre-operative assessment

## 2015-10-14 LAB — TYPE AND SCREEN
ABO/RH(D): A POS
ANTIBODY SCREEN: NEGATIVE

## 2015-10-14 LAB — CBC
HCT: 32.4 % — ABNORMAL LOW (ref 39.0–52.0)
Hemoglobin: 11.3 g/dL — ABNORMAL LOW (ref 13.0–17.0)
MCH: 31.8 pg (ref 26.0–34.0)
MCHC: 34.9 g/dL (ref 30.0–36.0)
MCV: 91.3 fL (ref 78.0–100.0)
PLATELETS: 134 10*3/uL — AB (ref 150–400)
RBC: 3.55 MIL/uL — ABNORMAL LOW (ref 4.22–5.81)
RDW: 12.6 % (ref 11.5–15.5)
WBC: 11.7 10*3/uL — AB (ref 4.0–10.5)

## 2015-10-14 LAB — BASIC METABOLIC PANEL
Anion gap: 5 (ref 5–15)
BUN: 15 mg/dL (ref 6–20)
CALCIUM: 9.2 mg/dL (ref 8.9–10.3)
CO2: 25 mmol/L (ref 22–32)
CREATININE: 1.21 mg/dL (ref 0.61–1.24)
Chloride: 107 mmol/L (ref 101–111)
GFR calc Af Amer: >60 mL/min (ref >60)
GFR, EST NON AFRICAN AMERICAN: 56 mL/min — AB (ref >60)
Glucose, Bld: 168 mg/dL — ABNORMAL HIGH (ref 65–99)
Potassium: 4.4 mmol/L (ref 3.5–5.1)
SODIUM: 137 mmol/L (ref 135–145)

## 2015-10-14 MED ORDER — LORATADINE 10 MG PO TABS
10.0000 mg | ORAL_TABLET | Freq: Every day | ORAL | Status: DC
  Administered 2015-10-14 – 2015-10-15 (×2): 10 mg via ORAL
  Filled 2015-10-14 (×2): qty 1

## 2015-10-14 MED ORDER — BISACODYL 10 MG RE SUPP
10.0000 mg | Freq: Once | RECTAL | Status: AC
  Administered 2015-10-14: 10 mg via RECTAL
  Filled 2015-10-14: qty 1

## 2015-10-14 MED ORDER — HYOSCYAMINE SULFATE 0.125 MG SL SUBL: 0.1250 mg | SUBLINGUAL_TABLET | SUBLINGUAL | Status: DC | PRN

## 2015-10-14 MED ORDER — OXYCODONE-ACETAMINOPHEN 5-325 MG PO TABS: 1.0000 | ORAL_TABLET | ORAL | Status: DC | PRN

## 2015-10-14 MED ORDER — POLYETHYLENE GLYCOL 3350 17 G PO PACK: 17.0000 g | PACK | Freq: Every day | ORAL | Status: DC

## 2015-10-14 MED ORDER — GUAIFENESIN ER 600 MG PO TB12
600.0000 mg | ORAL_TABLET | Freq: Two times a day (BID) | ORAL | Status: DC
  Administered 2015-10-14 – 2015-10-15 (×3): 600 mg via ORAL
  Filled 2015-10-14 (×3): qty 1

## 2015-10-14 MED ORDER — SENNA 8.6 MG PO TABS: 2.0000 | ORAL_TABLET | Freq: Every day | ORAL | Status: DC

## 2015-10-14 MED ORDER — CEPHALEXIN 500 MG PO CAPS
500.0000 mg | ORAL_CAPSULE | Freq: Three times a day (TID) | ORAL | Status: DC
  Administered 2015-10-14 – 2015-10-15 (×4): 500 mg via ORAL
  Filled 2015-10-14 (×4): qty 1

## 2015-10-14 MED ORDER — HYOSCYAMINE SULFATE 0.125 MG SL SUBL
0.2500 mg | SUBLINGUAL_TABLET | Freq: Four times a day (QID) | SUBLINGUAL | Status: DC
  Administered 2015-10-14 – 2015-10-15 (×4): 0.25 mg via SUBLINGUAL
  Filled 2015-10-14 (×9): qty 2

## 2015-10-14 MED ORDER — CEPHALEXIN 500 MG PO CAPS: 500.0000 mg | ORAL_CAPSULE | Freq: Three times a day (TID) | ORAL | Status: AC

## 2015-10-14 MED ORDER — ACETAMINOPHEN 325 MG PO TABS
650.0000 mg | ORAL_TABLET | Freq: Four times a day (QID) | ORAL | Status: AC
  Administered 2015-10-14 (×2): 650 mg via ORAL
  Filled 2015-10-14 (×2): qty 2

## 2015-10-14 NOTE — Discharge Instructions (Signed)
1. Activity:  You are encouraged to ambulate frequently (about every hour during waking hours) to help prevent blood clots from forming in your legs or lungs.  However, you should not engage in any heavy lifting (> 10-15 lbs), strenuous activity, or straining. 2. Diet: You should continue a clear liquid diet until passing gas from below.  Once this occurs, you may advance your diet to a soft diet that would be easy to digest (i.e soups, scrambled eggs, mashed potatoes, etc.) for 24 hours just as you would if getting over a bad stomach flu.  If tolerating this diet well for 24 hours, you may then begin eating regular food.  It will be normal to have some amount of bloating, nausea, and abdominal discomfort intermittently. 3. Prescriptions:  You will be provided a prescription for pain medication to take as needed.  If your pain is not severe enough to require the prescription pain medication, you may take extra strength Tylenol instead.  You should also take an over the counter stool softener (Senna 17.5 mg (2 tabs) nightly or miralax as needed) to avoid straining with bowel movements as the pain medication may constipate you. Finally, you will also be provided a prescription for an antibiotic to begin the day prior to your return visit in the office for catheter removal. 4. Catheter care: You will be taught how to take care of the catheter by the nursing staff prior to discharge from the hospital.  You may use both a leg bag and the larger bedside bag but it is recommended to at least use the bigger bedside bag at nighttime as the leg bag is small and will fill up overnight and also does not drain as well when lying flat. You may periodically feel a strong urge to void with the catheter in place.  This is a bladder spasm and most often can occur when having a bowel movement or when you are moving around. It is typically self-limited and usually will stop after a few minutes.  You may use some Vaseline or  Neosporin around the tip of the catheter to reduce friction at the tip of the penis. 5. Incisions: You may remove your dressing bandages the 2nd day after surgery.  You most likely will have a few small staples in each of the incisions and once the bandages are removed, the incisions may stay open to air.  You may start showering (not soaking or bathing in water) 48 hours after surgery and the incisions simply need to be patted dry after the shower.  No additional care is needed. 6. What to call us about: You should call the office [phone]) if you develop fever > 101, persistent vomiting, or the catheter stops draining. Also, feel free to call with any other questions you may have and remember the handout that was provided to you as a reference preoperatively which answers many of the common questions that arise after surgery.

## 2015-10-14 NOTE — Progress Notes (Signed)
1 Day Post-Op   Subjective: Patient reports pain control good. Reports 1x emesis after he looked down at his dressing and saw some blood on it. Otherwise, denies any further nausea/vomiting. Denies flatus/BMs. Tolerated clears. Walked last night in halls.  Complaining of some bladder spasms. AF, VSS. Hgb 11.3 from 12.5 pre-op. Cr stable. Excellent UOP with thin very light pink urine without clots or sediement. JP with only 65 cc SS output since surgery.   Objective: Vital signs in last 24 hours: Temp:  [97.5 F (36.4 C)-99.3 F (37.4 C)] 98.5 F (36.9 C) (05/23 0513) Pulse Rate:  [61-86] 69 (05/23 0513) Resp:  [0-23] 20 (05/23 0513) BP: (98-151)/(43-88) 121/62 mmHg (05/23 0513) SpO2:  [95 %-100 %] 97 % (05/23 0513) Weight:  [76.204 kg (168 lb)] 76.204 kg (168 lb) (05/22 1700)  Intake/Output from previous day: 05/22 0701 - 05/23 0700 In: 4150 [P.O.:120; I.V.:3980; IV Piggyback:50] Out: 2965 [Urine:2550; Drains:65; Blood:350] Intake/Output this shift:    Physical Exam:  General:alert, cooperative and appears stated age GI: Soft, non-distended, appropriately tender to palpation. Infraumbilical mid-line incision with staples and island dressing which is C/D/I. JP drain with small amount of SS output.  Male genitalia: Foley catheter in place with thin very light pink urine without clots or sediment. Draining well. Resp: No increased WOB on RA Extremities: extremities normal, atraumatic, no cyanosis or edema  Lab Results:  Recent Labs  10/13/15 1247 10/14/15 0456  HGB 12.5* 11.3*  HCT 35.8* 32.4*   BMET  Recent Labs  10/13/15 1247 10/14/15 0456  NA 138 137  K 3.9 4.4  CL 107 107  CO2 26 25  GLUCOSE 149* 168*  BUN 16 15  CREATININE 1.35* 1.21  CALCIUM 9.1 9.2   No results for input(s): LABPT, INR in the last 72 hours. No results for input(s): LABURIN in the last 72 hours. No results found for this or any previous visit.  Studies/Results: No results  found.  Assessment/Plan: 1 Day Post-Op Procedure(s) (LRB): SIMPLE OPEN RETROPUBIC PROSTATECTOMY  (N/A)    Regular diet  Medlock  Scheduled tylenol  Lidocaine patches  PRN dilaudid IV & oxycodone  Foley catheter to gravity - hand irrigate PRN  Continue home Myrbetriq  Schedule levsin for bladder spasms  PRN B&Os  Keflex x 10 days  Change claritin to 8 am  Add mucinex  Holding Xarelto and ASA 81 mg  Likely discharge tomorrow morning   LOS: 1 day   Raymond Hopkins 10/14/2015, 7:12 AM

## 2015-10-14 NOTE — Discharge Summary (Addendum)
Physician Discharge Summary  Patient ID: Raymond Hopkins MRN: [id number] DOB/AGE: [id number] 79 y.o.  Admit date: 10/13/2015 Discharge date: 10/15/2015  Admission Diagnoses: BPH  Discharge Diagnoses:  Active Problems:   BPH (benign prostatic hyperplasia)   Discharged Condition: good  Hospital Course:  Raymond Hopkins was taken to the operating room on 10/13/15 and underwent a open suprapubic simple prostatectomy. He tolerated this procedure well and without complications. Postoperatively, he was able to be transferred to a regular hospital room following recovery from anesthesia.  He was able to begin ambulating the night of surgery. He remained hemodynamically stable overnight.  He had excellent urine output with appropriately minimal output from his pelvic drain and his pelvic drain was removed on POD #2 prior to discharge.  He was transitioned to oral pain medication, tolerated a regular diet, and had met all discharge criteria and was able to be discharged home later on POD#2.  Consults: None  Significant Diagnostic Studies: none  Treatments: surgery: Open suprapubic simple prostatectomy, 10/13/15  Discharge Exam: Blood pressure 130/78, pulse 80, temperature 98.1 F (36.7 C), temperature source Oral, resp. rate 18, height _0  (1.727 m), weight 76.204 kg (168 lb), SpO2 98 %. General:alert, cooperative and appears stated age GI: Soft, non-distended, appropriately tender to palpation. Infraumbilical mid-line incision with staples without erythema, induration or exudate. JP drain out.  Male genitalia: Foley catheter in place with thin very light pink urine without clots or sediment. Draining well. Resp: No increased WOB on RA Extremities: extremities normal, atraumatic, no cyanosis or edema  Disposition: 01-Home or Self Care      Discharge Instructions    Call MD for:  difficulty breathing, headache or visual disturbances    Complete by:  As directed      Call MD for:  extreme  fatigue    Complete by:  As directed      Call MD for:  hives    Complete by:  As directed      Call MD for:  persistant dizziness or light-headedness    Complete by:  As directed      Call MD for:  persistant nausea and vomiting    Complete by:  As directed      Call MD for:  redness, tenderness, or signs of infection (pain, swelling, redness, odor or green/yellow discharge around incision site)    Complete by:  As directed      Call MD for:  severe uncontrolled pain    Complete by:  As directed      Call MD for:  temperature >100.4    Complete by:  As directed      Call MD for:    Complete by:  As directed   If your catheter stops draining for more then 1-2 hours and you have a strong urge to urinate     Diet general    Complete by:  As directed      Increase activity slowly    Complete by:  As directed      May walk up steps    Complete by:  As directed      No wound care    Complete by:  As directed             Medication List    STOP taking these medications        aspirin EC 81 MG tablet     celecoxib 200 MG capsule  Commonly known as:  CELEBREX  HYDROcodone-acetaminophen 7.5-325 MG tablet  Commonly known as:  NORCO     tamsulosin 0.4 MG Caps capsule  Commonly known as:  FLOMAX     traMADol 50 MG tablet  Commonly known as:  ULTRAM     XARELTO STARTER PACK 15 & 20 MG Tbpk  Generic drug:  Rivaroxaban      TAKE these medications        ANDROGEL 50 MG/5GM (1%) Gel  Generic drug:  testosterone  Place 2.5 g onto the skin 2 (two) times daily.     cephALEXin 500 MG capsule  Commonly known as:  KEFLEX  Take 1 capsule (500 mg total) by mouth 3 (three) times daily.     cholecalciferol 1000 units tablet  Commonly known as:  VITAMIN D  Take 1,000 Units by mouth 2 (two) times daily.     diclofenac sodium 1 % Gel  Commonly known as:  VOLTAREN  Apply 2 g topically 4 (four) times daily as needed (For pain.).     diphenhydrAMINE 25 MG tablet  Commonly  known as:  BENADRYL  Take 25 mg by mouth every 6 (six) hours as needed for allergies.     ferrous sulfate 325 (65 FE) MG tablet  Take 325 mg by mouth daily with breakfast.     fluticasone 50 MCG/ACT nasal spray  Commonly known as:  FLONASE  Place 1 spray into both nostrils daily.     guaiFENesin 600 MG 12 hr tablet  Commonly known as:  MUCINEX  Take 600 mg by mouth 2 (two) times daily as needed for to loosen phlegm.     hyoscyamine 0.125 MG SL tablet  Commonly known as:  LEVSIN SL  Place 1 tablet (0.125 mg total) under the tongue every 4 (four) hours as needed for cramping (bladder spasms).     LEG CRAMP RELIEF PO  Take 1 tablet by mouth daily as needed (For cramping.).     loratadine 10 MG tablet  Commonly known as:  CLARITIN  Take 10 mg by mouth daily as needed for allergies.     magnesium gluconate 500 MG tablet  Commonly known as:  MAGONATE  Take 500 mg by mouth 2 (two) times daily.     MYRBETRIQ 50 MG Tb24 tablet  Generic drug:  mirabegron ER  Take 50 mg by mouth every evening.     OVER THE COUNTER MEDICATION  Take 2 capsules by mouth every morning. Omega XL     oxyCODONE-acetaminophen 5-325 MG tablet  Commonly known as:  ROXICET  Take 1-2 tablets by mouth every 4 (four) hours as needed for moderate pain or severe pain.     pantoprazole 40 MG tablet  Commonly known as:  PROTONIX  Take 40 mg by mouth daily.     polyethylene glycol packet  Commonly known as:  MIRALAX / GLYCOLAX  Take 17 g by mouth daily. Take daily while on narcotics. Hold for diarrhea.     pyridOXINE 100 MG tablet  Commonly known as:  VITAMIN B-6  Take 100 mg by mouth daily.     senna 8.6 MG Tabs tablet  Commonly known as:  SENOKOT  Take 2 tablets (17.2 mg total) by mouth at bedtime. Hold for diarrhea.     sildenafil 100 MG tablet  Commonly known as:  VIAGRA  Take 100 mg by mouth daily as needed for erectile dysfunction.     tadalafil 10 MG tablet  Commonly known as:  CIALIS  Take 10  mg by mouth daily as needed for erectile dysfunction.     vitamin B-12 500 MCG tablet  Commonly known as:  CYANOCOBALAMIN  Take 500 mcg by mouth 2 (two) times daily.         Signed: Acie Fredrickson 10/15/2015, 7:13 AM  Urology Attending Note: Pt seen and examined. Minimal blood loss. + flatus. Minimal discomfort. Ready for d/c. Has return appointment for cystogram prior to catheter removal.

## 2015-10-14 NOTE — Progress Notes (Signed)
Assumed care of pt at 1530.  I agree with previous RN's assessment.   

## 2015-10-14 NOTE — Progress Notes (Signed)
1 Day Post-Op   Subjective: Patient reports pain control good. Denies nausea/vomiting. Passing flatus but no BM. Tolerated regular diet. Walking in halls.  AF, VSS. Excellent UOP with thin very light pink urine without clots or sediement.   Objective: Vital signs in last 24 hours: Temp:  [98.5 F (36.9 C)-99.3 F (37.4 C)] 98.5 F (36.9 C) (05/23 0513) Pulse Rate:  [66-86] 69 (05/23 0513) Resp:  [20] 20 (05/23 0513) BP: (98-128)/(43-82) 121/62 mmHg (05/23 0513) SpO2:  [95 %-100 %] 97 % (05/23 0513) Weight:  [76.204 kg (168 lb)] 76.204 kg (168 lb) (05/22 1700)  Intake/Output from previous day: 05/22 0701 - 05/23 0700 In: 4150 [P.O.:120; I.V.:3980; IV Piggyback:50] Out: 2965 [Urine:2550; Drains:65; Blood:350] Intake/Output this shift: Total I/O In: -  Out: 600 [Urine:600]  Physical Exam:  General:alert, cooperative and appears stated age GI: Soft, non-distended, appropriately tender to palpation. Infraumbilical mid-line incision with staples and island dressing which is C/D/I. JP drain with small amount of SS output.  Male genitalia: Foley catheter in place with thin very light pink urine without clots or sediment. Draining well. Resp: No increased WOB on RA Extremities: extremities normal, atraumatic, no cyanosis or edema  Lab Results:  Recent Labs  10/13/15 1247 10/14/15 0456  HGB 12.5* 11.3*  HCT 35.8* 32.4*   BMET  Recent Labs  10/13/15 1247 10/14/15 0456  NA 138 137  K 3.9 4.4  CL 107 107  CO2 26 25  GLUCOSE 149* 168*  BUN 16 15  CREATININE 1.35* 1.21  CALCIUM 9.1 9.2   No results for input(s): LABPT, INR in the last 72 hours. No results for input(s): LABURIN in the last 72 hours. No results found for this or any previous visit.  Studies/Results: No results found.  Assessment/Plan: 1 Day Post-Op Procedure(s) (LRB): SIMPLE OPEN RETROPUBIC PROSTATECTOMY  (N/A)    Regular diet  Medlocked  Suppository again this AM  D/C JP drain this  AM  Scheduled tylenol  Lidocaine patches  PRN oxycodone  Foley catheter to gravity   Continue home Myrbetriq  Schedule levsin for bladder spasms  PRN B&Os  Keflex x 10 days  Holding Xarelto and ASA 81 mg till after foley removal, after cystogram next week.  Likely discharge later this AM    LOS: 1 day   Acie Fredrickson 10/14/2015, 3:24 PM   Urologyu Attending Note: Pt seen and examined: Agree with discharge today.

## 2015-10-15 LAB — HEMOGLOBIN AND HEMATOCRIT, BLOOD
HEMATOCRIT: 33.9 % — AB (ref 39.0–52.0)
HEMOGLOBIN: 11.6 g/dL — AB (ref 13.0–17.0)

## 2015-10-15 LAB — BASIC METABOLIC PANEL
ANION GAP: 6 (ref 5–15)
BUN: 20 mg/dL (ref 6–20)
CALCIUM: 9.5 mg/dL (ref 8.9–10.3)
CO2: 28 mmol/L (ref 22–32)
CREATININE: 1.45 mg/dL — AB (ref 0.61–1.24)
Chloride: 105 mmol/L (ref 101–111)
GFR, EST AFRICAN AMERICAN: 52 mL/min — AB (ref >60)
GFR, EST NON AFRICAN AMERICAN: 45 mL/min — AB (ref >60)
Glucose, Bld: 90 mg/dL (ref 65–99)
Potassium: 4 mmol/L (ref 3.5–5.1)
Sodium: 139 mmol/L (ref 135–145)

## 2015-10-15 MED ORDER — BISACODYL 10 MG RE SUPP
10.0000 mg | Freq: Once | RECTAL | Status: AC
  Administered 2015-10-15: 10 mg via RECTAL
  Filled 2015-10-15: qty 1

## 2015-10-15 NOTE — Progress Notes (Signed)
Discharge teaching on foley care at home provided to patient and patient's wife.  Patient's wife did return demonstration on foley exchange between leg bag and standard drainage bag with no difficulty.  Supplies given to patient.  Questions answered.  Patient ready for discharge.

## 2015-10-15 NOTE — Progress Notes (Signed)
2 Days Post-Op   Subjective: Patient reports pain control good. Denies nausea/vomiting. Passing flatus today but no BM. Tolerated regular diet. Walking in halls.  AF, VSS. Excellent UOP with thin very light pink urine without clots or sediement.   Objective: Vital signs in last 24 hours: Temp:  [98.1 F (36.7 C)-99 F (37.2 C)] 98.1 F (36.7 C) (05/24 0514) Pulse Rate:  [62-80] 80 (05/24 0514) Resp:  [18] 18 (05/24 0514) BP: (112-141)/(67-89) 130/78 mmHg (05/24 0514) SpO2:  [98 %] 98 % (05/24 0514)  Intake/Output from previous day: 05/23 0701 - 05/24 0700 In: 480 [P.O.:480] Out: 2025 [Urine:2000; Drains:25] Intake/Output this shift: Total I/O In: -  Out: 1025 [Urine:1000; Drains:25]  Physical Exam:  General:alert, cooperative and appears stated age GI: Soft, non-distended, appropriately tender to palpation. Infraumbilical mid-line incision with staples and island dressing which is C/D/I. JP drain with small amount of SS output.  Male genitalia: Foley catheter in place with thin very light pink urine without clots or sediment. Draining well. Resp: No increased WOB on RA Extremities: extremities normal, atraumatic, no cyanosis or edema  Lab Results:  Recent Labs  10/13/15 1247 10/14/15 0456  HGB 12.5* 11.3*  HCT 35.8* 32.4*   BMET  Recent Labs  10/13/15 1247 10/14/15 0456  NA 138 137  K 3.9 4.4  CL 107 107  CO2 26 25  GLUCOSE 149* 168*  BUN 16 15  CREATININE 1.35* 1.21  CALCIUM 9.1 9.2   No results for input(s): LABPT, INR in the last 72 hours. No results for input(s): LABURIN in the last 72 hours. No results found for this or any previous visit.  Studies/Results: No results found.  Assessment/Plan: 2 Days Post-Op Procedure(s) (LRB): SIMPLE OPEN RETROPUBIC PROSTATECTOMY  (N/A)    Regular diet  Medlock  D/C drain  Scheduled tylenol  Lidocaine patches  PRN oxycodone  Foley catheter to gravity   Continue home Myrbetriq  Scheduled levsin  for bladder spasms  PRN B&Os  Keflex x 10 days  Holding Xarelto and ASA 81 mg  Likely discharge later this AM   LOS: 2 days   Acie Fredrickson 10/15/2015, 6:43 AM

## 2015-10-23 DIAGNOSIS — C61 Malignant neoplasm of prostate: Secondary | ICD-10-CM | POA: Diagnosis not present

## 2015-10-29 ENCOUNTER — Ambulatory Visit (INDEPENDENT_AMBULATORY_CARE_PROVIDER_SITE_OTHER): Payer: Commercial Managed Care - HMO | Admitting: Neurology

## 2015-10-29 ENCOUNTER — Encounter: Payer: Self-pay | Admitting: Neurology

## 2015-10-29 VITALS — BP 122/78 | HR 78 | Ht 68.0 in | Wt 159.0 lb

## 2015-10-29 DIAGNOSIS — G3184 Mild cognitive impairment, so stated: Secondary | ICD-10-CM | POA: Diagnosis not present

## 2015-10-29 NOTE — Patient Instructions (Signed)
When you drive, use GPS.  Also, limit driving to daylight hours.  Consider taking a neuropsychological evaluation, which is a much more sensitive cognitive test.  Contact me if you wish for [place] to set this up.  Follow up in one year or as needed.

## 2015-10-29 NOTE — Progress Notes (Signed)
Note routed
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
## 2015-11-06 DIAGNOSIS — N393 Stress incontinence (female) (male): Secondary | ICD-10-CM | POA: Diagnosis not present

## 2015-11-06 DIAGNOSIS — M6281 Muscle weakness (generalized): Secondary | ICD-10-CM | POA: Diagnosis not present

## 2015-11-06 DIAGNOSIS — R278 Other lack of coordination: Secondary | ICD-10-CM | POA: Diagnosis not present

## 2015-12-10 DIAGNOSIS — H52223 Regular astigmatism, bilateral: Secondary | ICD-10-CM | POA: Diagnosis not present

## 2015-12-10 DIAGNOSIS — H524 Presbyopia: Secondary | ICD-10-CM | POA: Diagnosis not present

## 2015-12-10 DIAGNOSIS — H2513 Age-related nuclear cataract, bilateral: Secondary | ICD-10-CM | POA: Diagnosis not present

## 2015-12-10 DIAGNOSIS — H5203 Hypermetropia, bilateral: Secondary | ICD-10-CM | POA: Diagnosis not present

## 2016-01-05 ENCOUNTER — Encounter: Payer: Self-pay | Admitting: Neurology

## 2016-01-05 ENCOUNTER — Telehealth: Payer: Self-pay

## 2016-01-05 DIAGNOSIS — G3184 Mild cognitive impairment, so stated: Secondary | ICD-10-CM

## 2016-01-05 MED ORDER — RIVASTIGMINE TARTRATE 6 MG PO CAPS: 6.0000 mg | ORAL_CAPSULE | Freq: Two times a day (BID) | ORAL | 3 refills | Status: DC

## 2016-01-05 MED ORDER — RIVASTIGMINE TARTRATE 1.5 MG PO CAPS: ORAL_CAPSULE | ORAL | 0 refills | Status: DC

## 2016-01-05 NOTE — Telephone Encounter (Signed)
Please see mychart message.   From A&P section of OV on date patient is speaking of:   "As far as cholinesterase inhibitors, we can still try rivastigmine.  He prefers not to take anything.  I recommended getting neuropsychological testing.  If testing reveals early Alzheimer's rather than MCI, then that may sway his decision about taking a cholinesterase inhibitor.  He would like to hold off for now and will contact us if he changes his mind or if they note a decline in cognition."  Please advise.

## 2016-01-05 NOTE — Telephone Encounter (Signed)
If he is open to it, we can refer him for neuropsychological testing first, which is a much more sensitive test for memory.  Otherwise, we can just start a different memory medication:  Rivastigmine 1.5mg  twice daily for 2 weeks, then 3mg  twice daily for 2 weeks, then 4.5mg  twice daily for 2 weeks, then goal of 6mg  twice daily.  Each dose should be taken with food.

## 2016-01-23 ENCOUNTER — Ambulatory Visit
Admission: RE | Admit: 2016-01-23 | Discharge: 2016-01-23 | Disposition: A | Discharge status: Home / Self Care | Payer: Commercial Managed Care - HMO | Source: Ambulatory Visit | Attending: Family Medicine | Admitting: Family Medicine

## 2016-01-23 ENCOUNTER — Other Ambulatory Visit: Payer: Self-pay | Admitting: Family Medicine

## 2016-01-23 DIAGNOSIS — Z86718 Personal history of other venous thrombosis and embolism: Secondary | ICD-10-CM | POA: Diagnosis not present

## 2016-01-23 DIAGNOSIS — R071 Chest pain on breathing: Secondary | ICD-10-CM | POA: Diagnosis not present

## 2016-01-23 DIAGNOSIS — R0789 Other chest pain: Secondary | ICD-10-CM

## 2016-01-23 DIAGNOSIS — R079 Chest pain, unspecified: Secondary | ICD-10-CM | POA: Diagnosis not present

## 2016-02-10 ENCOUNTER — Other Ambulatory Visit: Payer: Self-pay

## 2016-02-10 DIAGNOSIS — R7303 Prediabetes: Secondary | ICD-10-CM | POA: Diagnosis not present

## 2016-02-10 DIAGNOSIS — Z7901 Long term (current) use of anticoagulants: Secondary | ICD-10-CM | POA: Diagnosis not present

## 2016-02-10 DIAGNOSIS — M159 Polyosteoarthritis, unspecified: Secondary | ICD-10-CM | POA: Diagnosis not present

## 2016-02-10 DIAGNOSIS — Z23 Encounter for immunization: Secondary | ICD-10-CM | POA: Diagnosis not present

## 2016-02-10 NOTE — Patient Outreach (Signed)
[facility]) Care Management  02/10/2016  Raymond Hopkins 1934-12-01 [id number]   Telephone call to wife per patient request.  HIPAA verified through wife.  Explained [facility] Care Management Services.  Wife reports that patient sees his primary doctor regularly and that patient also sees neurologist and urologist and patient is doing well at this time and does not think [place] are needed at this time.  Wife agreeable to receive information on [place] [facility] for future reference.    Plan: Will send letter with brochure for future reference.  Jone Baseman, RN, MSN [place] [phone]

## 2016-03-16 DIAGNOSIS — H2513 Age-related nuclear cataract, bilateral: Secondary | ICD-10-CM | POA: Diagnosis not present

## 2016-03-16 DIAGNOSIS — H2511 Age-related nuclear cataract, right eye: Secondary | ICD-10-CM | POA: Diagnosis not present

## 2016-03-16 DIAGNOSIS — H25013 Cortical age-related cataract, bilateral: Secondary | ICD-10-CM | POA: Diagnosis not present

## 2016-03-24 DIAGNOSIS — E291 Testicular hypofunction: Secondary | ICD-10-CM | POA: Diagnosis not present

## 2016-03-24 DIAGNOSIS — C61 Malignant neoplasm of prostate: Secondary | ICD-10-CM | POA: Diagnosis not present

## 2016-03-24 DIAGNOSIS — N5201 Erectile dysfunction due to arterial insufficiency: Secondary | ICD-10-CM | POA: Diagnosis not present

## 2016-04-13 DIAGNOSIS — M549 Dorsalgia, unspecified: Secondary | ICD-10-CM | POA: Diagnosis not present

## 2016-04-13 DIAGNOSIS — M25562 Pain in left knee: Secondary | ICD-10-CM | POA: Diagnosis not present

## 2016-05-03 DIAGNOSIS — H16223 Keratoconjunctivitis sicca, not specified as Sjogren's, bilateral: Secondary | ICD-10-CM | POA: Diagnosis not present

## 2016-05-03 DIAGNOSIS — H1859 Other hereditary corneal dystrophies: Secondary | ICD-10-CM | POA: Diagnosis not present

## 2016-05-04 ENCOUNTER — Ambulatory Visit: Payer: Commercial Managed Care - HMO | Attending: Family Medicine | Admitting: Physical Therapy

## 2016-05-04 DIAGNOSIS — M25561 Pain in right knee: Secondary | ICD-10-CM | POA: Insufficient documentation

## 2016-05-04 DIAGNOSIS — M25562 Pain in left knee: Secondary | ICD-10-CM | POA: Diagnosis not present

## 2016-05-04 DIAGNOSIS — R296 Repeated falls: Secondary | ICD-10-CM | POA: Diagnosis not present

## 2016-05-04 DIAGNOSIS — M545 Low back pain, unspecified: Secondary | ICD-10-CM

## 2016-05-04 DIAGNOSIS — G8929 Other chronic pain: Secondary | ICD-10-CM

## 2016-05-04 NOTE — Therapy (Signed)
[place], [place], [address] Phone: [phone]   Fax:  [phone]  Physical Therapy Evaluation  Patient Details  Name: Raymond Hopkins MRN: [id number] Date of Birth: [birth year] Referring Provider: Aura Dials, MD  Encounter Date: 05/04/2016      PT End of Session - 05/04/16 1237    Visit Number 1   Number of Visits 9   Date for PT Re-Evaluation 06/04/16   Authorization Type [employer] MCR   PT Start Time [id number]   PT Stop Time 1236   PT Time Calculation (min) 51 min   Activity Tolerance Patient tolerated treatment well   Behavior During Therapy [facility] for tasks assessed/performed      Past Medical History:  Diagnosis Date  . Arthritis   . Bilateral cataracts   . Blood transfusion   . BPH (benign prostatic hyperplasia)   . Dementia    early onset per wife  . GERD (gastroesophageal reflux disease)   . Memory changes   . Right leg DVT ([place])    august 2016, on Xarelto for 6 months  . Seasonal allergies     Past Surgical History:  Procedure Laterality Date  . CARPAL TUNNEL RELEASE  1990   bilateral  . cyst on spine  1959  . NASAL SINUS SURGERY     numerous ENT surgeries  . PROSTATECTOMY N/A 10/13/2015   Procedure: SIMPLE OPEN RETROPUBIC PROSTATECTOMY ;  Surgeon: Carolan Clines, MD;  Location: [facility];  Service: Urology;  Laterality: N/A;  . SHOULDER SURGERY  2002   right  . TONSILLECTOMY      There were no vitals filed for this visit.       Subjective Assessment - 05/04/16 1146    Subjective Pt was playing basketball until about a month ago. Wife reports pt was chopping wood and had some pain and wanted a back brace. Has pain in bilateral knees, L knee first after 2 falls while playing basketball. Is wearing a brace for lumbar spine when it starts to hurt.    How long can you sit comfortably? fidgets after a short time   Patient Stated Goals decrease pain,  return to working out at  [employer], chop wood   Currently in Pain? Yes   Pain Score 4    Pain Location Back   Pain Orientation Left;Lower   Pain Descriptors / Indicators Aching   Pain Type Acute pain  2 weeks   Pain Radiating Towards hamstring cramping   Aggravating Factors  chopping/handling wood   Pain Relieving Factors rest in chair, tylenol            [facility] PT Assessment - 05/04/16 0001      Assessment   Medical Diagnosis LBP without sciatica, falling/gait training   Referring Provider Aura Dials, MD   Onset Date/Surgical Date 04/20/16   Hand Dominance Right   Next MD Visit not scheduled at this time   Prior Therapy not this year     Precautions   Precaution Comments early stages dementia, multiple falls     Restrictions   Weight Bearing Restrictions No     Balance Screen   Has the patient fallen in the past 6 months Yes   How many times? 2  playing basketball     [place] residence   Living Arrangements Spouse/significant other   Additional Comments spiral stairs at home     Prior Function   Level of  Independence Independent     Observation/Other Assessments   Focus on Therapeutic Outcomes (FOTO)  47% ability     Sensation   Additional Comments WFL     Posture/Postural Control   Posture Comments 10 deg resting lumbar flx, kyphotic with forward head     ROM / Strength   AROM / PROM / Strength Strength     Strength   Overall Strength Comments GHJ ER 4-/5   Strength Assessment Site Knee;Hip   Right/Left Hip Right;Left   Right Hip ABduction 3+/5   Left Hip ABduction 3+/5   Right/Left Knee Right;Left   Right Knee Flexion 5/5   Right Knee Extension 5/5   Left Knee Flexion 5/5   Left Knee Extension 5/5     Palpation   Palpation comment TTP L QL                   [facility] Adult PT Treatment/Exercise - 05/04/16 0001      Exercises   Exercises Lumbar;Knee/Hip;Shoulder     Knee/Hip Exercises: Sidelying   Clams with feet  elevated     Shoulder Exercises: Seated   Theraband Level (Shoulder External Rotation) Level 3 (Green)   External Rotation Limitations as many as he can in 30s   Other Seated Exercises scapular retraction/postural control                PT Education - 05/04/16 1316    Education provided Yes   Education Details anatomy of condition, POC,HEP, exercise form/rationale, posture   Person(s) Educated Patient;Spouse   Methods Explanation;Demonstration;Tactile cues;Verbal cues;Handout   Comprehension Verbalized understanding;Returned demonstration;Verbal cues required;Tactile cues required;Need further instruction             PT Long Term Goals - 05/04/16 1317      PT LONG TERM GOAL #1   Title FOTO to 62% ability to indicate significant improvement in functional ability by 1/12   Baseline 47% at eval   Time 4   Period Weeks   Status New     PT LONG TERM GOAL #2   Title Pt will demonstrate exercises to perform at the [facility] and verbalize understanding of necessary precautions   Baseline began educating and will progress as appropriate   Time 4   Period Weeks   Status New     PT LONG TERM GOAL #3   Title Pt will demo ability to navigate quick changes in direction as well as navigate unsteady surfaces to decrease risk of falls   Baseline multiple falls recently   Time 4   Period Weeks   Status New     PT LONG TERM GOAL #4   Title Pt will verbalize LBP <=2/0 and abilty to utilize stretches/exercises to provide necessary support to low back, back bracing only with very strenuous exercises   Baseline will educate and train as treatment progresses   Time 4   Period Weeks   Status New               Plan - 05/04/16 1304    Clinical Impression Statement Pt presents to PT with complaints of LBP and bilateral knee pain. Pt is very active and presents with good strength in anterior and posterior movements but limited in rotational and lateral movements. Pt Presents with  poor upright posture which has resulted in neck pain. Pt will benefit from skilled PT in order to improve periscapular strength and hip strength/stabiility to provide support to neck and low back and  to meet long term functional goals.    Rehab Potential Good   Clinical Impairments Affecting Rehab Potential early onset dementia   PT Frequency 2x / week   PT Duration 4 weeks   PT Treatment/Interventions ADLs/Self Care Home Management;Cryotherapy;Electrical Stimulation;Iontophoresis 4mg /ml Dexamethasone;Functional mobility training;Stair training;Gait training;Ultrasound;Traction;Moist Heat;Therapeutic activities;Therapeutic exercise;Balance training;Neuromuscular re-education;Patient/family education;Passive range of motion;Manual techniques;Dry needling;Taping   PT Next Visit Plan hip abductor strength, periscapular strength, balance test/challenges   PT Home Exercise Plan upright posture/scapular retraction, GHJ ER green tband, clams feet elevated.    Consulted and Agree with Plan of Care Patient;Family member/caregiver   Family Member Consulted Wife      Patient will benefit from skilled therapeutic intervention in order to improve the following deficits and impairments:  Increased muscle spasms, Decreased activity tolerance, Pain, Improper body mechanics, Impaired flexibility, Decreased strength, Postural dysfunction  Visit Diagnosis: Acute midline low back pain without sciatica - Plan: PT plan of care cert/re-cert  Repeated falls - Plan: PT plan of care cert/re-cert  Chronic pain of right knee - Plan: PT plan of care cert/re-cert  Acute pain of left knee - Plan: PT plan of care cert/re-cert      G-Codes - [id number]    Functional Assessment Tool Used FOTO 47% ability (goal 62%), clinical judgement   Functional Limitation Mobility: Walking and moving around   Mobility: Walking and Moving Around Current Status [id number]) At least 40 percent but less than 60 percent impaired, limited or  restricted   Mobility: Walking and Moving Around Goal Status [phone]) At least 20 percent but less than 40 percent impaired, limited or restricted       Problem List Patient Active Problem List   Diagnosis Date Noted  . Acute deep vein thrombosis (DVT) of distal end of right lower extremity ([place]) 09/23/2015  . 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 Orthostatic hypotension 08/15/2012  . Near syncope 08/15/2012  . GERD (gastroesophageal reflux disease) 08/15/2012  . Arthritis 08/15/2012  . BPH (benign prostatic hyperplasia) 08/15/2012    Alesha Jaffee C. Sharen Youngren PT, DPT 05/04/16 1:25 PM   [facility] [address] [place], [place], [address] Phone: [phone]   Fax:  [phone]  Name: Raymond Hopkins MRN: [id number] Date of Birth: Oct 30, 1934

## 2016-05-08 DIAGNOSIS — H1859 Other hereditary corneal dystrophies: Secondary | ICD-10-CM | POA: Diagnosis not present

## 2016-05-08 DIAGNOSIS — H16221 Keratoconjunctivitis sicca, not specified as Sjogren's, right eye: Secondary | ICD-10-CM | POA: Diagnosis not present

## 2016-05-11 ENCOUNTER — Encounter: Payer: Self-pay | Admitting: Physical Therapy

## 2016-05-11 ENCOUNTER — Ambulatory Visit: Payer: Commercial Managed Care - HMO | Admitting: Physical Therapy

## 2016-05-11 DIAGNOSIS — M25561 Pain in right knee: Secondary | ICD-10-CM

## 2016-05-11 DIAGNOSIS — M25562 Pain in left knee: Secondary | ICD-10-CM

## 2016-05-11 DIAGNOSIS — M545 Low back pain, unspecified: Secondary | ICD-10-CM

## 2016-05-11 DIAGNOSIS — R296 Repeated falls: Secondary | ICD-10-CM

## 2016-05-11 DIAGNOSIS — G8929 Other chronic pain: Secondary | ICD-10-CM

## 2016-05-11 NOTE — Therapy (Signed)
[place], [place], [address] Phone: [phone]   Fax:  [phone]  Physical Therapy Treatment  Patient Details  Name: Raymond Hopkins MRN: [id number] Date of Birth: 1934/11/06 Referring Provider: Aura Dials, MD  Encounter Date: 05/11/2016      PT End of Session - 05/11/16 1421    Visit Number 2   Number of Visits 9   Date for PT Re-Evaluation 06/04/16   Authorization Type [employer] MCR   PT Start Time 1330   PT Stop Time 1415   PT Time Calculation (min) 45 min   Activity Tolerance Patient tolerated treatment well   Behavior During Therapy [facility] for tasks assessed/performed      Past Medical History:  Diagnosis Date  . Arthritis   . Bilateral cataracts   . Blood transfusion   . BPH (benign prostatic hyperplasia)   . Dementia    early onset per wife  . GERD (gastroesophageal reflux disease)   . Memory changes   . Right leg DVT ([place])    august 2016, on Xarelto for 6 months  . Seasonal allergies     Past Surgical History:  Procedure Laterality Date  . CARPAL TUNNEL RELEASE  1990   bilateral  . cyst on spine  1959  . NASAL SINUS SURGERY     numerous ENT surgeries  . PROSTATECTOMY N/A 10/13/2015   Procedure: SIMPLE OPEN RETROPUBIC PROSTATECTOMY ;  Surgeon: Carolan Clines, MD;  Location: [facility];  Service: Urology;  Laterality: N/A;  . SHOULDER SURGERY  2002   right  . TONSILLECTOMY      There were no vitals filed for this visit.      Subjective Assessment - 05/11/16 1332    Subjective Pt reporting no pain at present but reporting he hasn't split wood yet. Pt instructed to use caution when lifting wood to place on his spliter.    Limitations Lifting   How long can you sit comfortably? fidgets after a short time    Patient Stated Goals decreased pain, return to working out at [employer], chopping wood   Currently in Pain? No/denies   Aggravating Factors  chopping, handling wood    Pain Relieving Factors resting, tylenol                         [facility] Adult PT Treatment/Exercise - 05/11/16 0001      High Level Balance   High Level Balance Activities Side stepping;Braiding;Backward walking   High Level Balance Comments SLS: R: 2 attempts to hold 25 seconds with supervision, SLS: L : 30 seconds     Exercises   Exercises Lumbar;Knee/Hip     Lumbar Exercises: Standing   Row 10 reps   Other Standing Lumbar Exercises wall push up x 5 holding 10 seconds     Lumbar Exercises: Supine   Clam 15 reps   Clam Limitations Green Theraband   Dead Bug 10 reps;2 seconds   Bridge 10 reps;5 seconds   Other Supine Lumbar Exercises Bridge with feet on therapybal     Knee/Hip Exercises: Sidelying   Hip ABduction 2 sets;10 reps   Clams 10 reps 2 sets   Other Sidelying Knee/Hip Exercises Reverse clams x 10                PT Education - 05/11/16 1419    Education provided Yes   Education Details reviewed HEP and rationale for each exercise and balance  exercises during treatment   Person(s) Educated Patient   Methods Explanation;Demonstration;Tactile cues;Verbal cues   Comprehension Verbalized understanding;Returned demonstration             PT Long Term Goals - 05/11/16 1427      PT LONG TERM GOAL #1   Title FOTO to 62% ability to indicate significant improvement in functional ability by 1/12   Baseline 47% at eval   Time 4   Period Weeks   Status New     PT LONG TERM GOAL #2   Title Pt will demonstrate exercises to perform at the [facility] and verbalize understanding of necessary precautions   Baseline began educating and will progress as appropriate   Time 4   Period Weeks   Status New     PT LONG TERM GOAL #3   Title Pt will demo ability to navigate quick changes in direction as well as navigate unsteady surfaces to decrease risk of falls   Baseline multiple falls recently   Time 4   Period Weeks   Status New     PT LONG TERM GOAL  #4   Title Pt will verbalize LBP <=2/0 and abilty to utilize stretches/exercises to provide necessary support to low back, back bracing only with very strenuous exercises   Baseline will educate and train as treatment progresses   Time 4   Period Weeks   Status New               Plan - 05/11/16 1421    Clinical Impression Statement Pt presents to therapy today complaining of LBP which is intermittent and worsens when doing outdoor activities. Pt denies pain today upon arrival, but reports he hasn't been working outside today. We reviewed pt's HEP to correct posture and technique. Pt explained rationale for every exercise. Pt still presenting with weakness in bilateral hip abductors/adductors and decreased balance. Pt has not met his goals since last visit. Skilled PT recommended to continue to progress pt's functional mobility, decrease pain and improve balance.    Rehab Potential Good   Clinical Impairments Affecting Rehab Potential early onset dementia   PT Frequency 2x / week   PT Duration 4 weeks   PT Treatment/Interventions ADLs/Self Care Home Management;Cryotherapy;Electrical Stimulation;Iontophoresis 29m/ml Dexamethasone;Functional mobility training;Stair training;Gait training;Ultrasound;Traction;Moist Heat;Therapeutic activities;Therapeutic exercise;Balance training;Neuromuscular re-education;Patient/family education;Passive range of motion;Manual techniques;Dry needling;Taping   PT Next Visit Plan hip abductor strength, periscapular strength, balance exercises   PT Home Exercise Plan upright posture/scapular retraction, GHJ ER green tband, clams feet elevated.    Consulted and Agree with Plan of Care Patient      Patient will benefit from skilled therapeutic intervention in order to improve the following deficits and impairments:  Increased muscle spasms, Decreased activity tolerance, Pain, Improper body mechanics, Impaired flexibility, Decreased strength, Postural  dysfunction  Visit Diagnosis: Acute midline low back pain without sciatica  Repeated falls  Chronic pain of right knee  Acute pain of left knee     Problem List Patient Active Problem List   Diagnosis Date Noted  . Acute deep vein thrombosis (DVT) of distal end of right lower extremity [place] 09/23/2015  . 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 Orthostatic hypotension 08/15/2012  . Near syncope 08/15/2012  . GERD (gastroesophageal reflux disease) 08/15/2012  . Arthritis 08/15/2012  . BPH (benign prostatic hyperplasia) 08/15/2012    JOretha Caprice MPT 05/11/2016, 2:40 PM  [place]  [address] Phone: [phone]   Fax:  [phone]  Name: BRAELYNN LUPTON MRN: [id number] Date of Birth: October 28, 1934

## 2016-05-13 ENCOUNTER — Ambulatory Visit: Payer: Commercial Managed Care - HMO | Admitting: Physical Therapy

## 2016-05-13 DIAGNOSIS — M545 Low back pain, unspecified: Secondary | ICD-10-CM

## 2016-05-13 DIAGNOSIS — G8929 Other chronic pain: Secondary | ICD-10-CM | POA: Diagnosis not present

## 2016-05-13 DIAGNOSIS — R296 Repeated falls: Secondary | ICD-10-CM | POA: Diagnosis not present

## 2016-05-13 DIAGNOSIS — M25562 Pain in left knee: Secondary | ICD-10-CM | POA: Diagnosis not present

## 2016-05-13 DIAGNOSIS — M25561 Pain in right knee: Secondary | ICD-10-CM

## 2016-05-13 NOTE — Therapy (Signed)
[place], [place], [address] Phone: [phone]   Fax:  [phone]  Physical Therapy Treatment  Patient Details  Name: Raymond Hopkins MRN: [id number] Date of Birth: 08-12-1934 Referring Provider: Aura Dials, MD  Encounter Date: 05/13/2016      PT End of Session - 05/13/16 1735    Visit Number 3   Number of Visits 9   Date for PT Re-Evaluation 06/04/16   PT Start Time 1630   PT Stop Time 1720   PT Time Calculation (min) 50 min   Activity Tolerance Patient tolerated treatment well   Behavior During Therapy [facility] for tasks assessed/performed      Past Medical History:  Diagnosis Date  . Arthritis   . Bilateral cataracts   . Blood transfusion   . BPH (benign prostatic hyperplasia)   . Dementia    early onset per wife  . GERD (gastroesophageal reflux disease)   . Memory changes   . Right leg DVT ([place])    august 2016, on Xarelto for 6 months  . Seasonal allergies     Past Surgical History:  Procedure Laterality Date  . CARPAL TUNNEL RELEASE  1990   bilateral  . cyst on spine  1959  . NASAL SINUS SURGERY     numerous ENT surgeries  . PROSTATECTOMY N/A 10/13/2015   Procedure: SIMPLE OPEN RETROPUBIC PROSTATECTOMY ;  Surgeon: Carolan Clines, MD;  Location: [facility];  Service: Urology;  Laterality: N/A;  . SHOULDER SURGERY  2002   right  . TONSILLECTOMY      There were no vitals filed for this visit.      Subjective Assessment - 05/13/16 1640    Subjective has been splitting wood . has 3/10 low back pain   Currently in Pain? Yes   Pain Score 3   up to 9.5/10   Pain Location Back   Pain Orientation Left;Lower   Pain Descriptors / Indicators Aching                         [facility] Adult PT Treatment/Exercise - 05/13/16 0001      Lumbar Exercises: Supine   Other Supine Lumbar Exercises Decompression 5 minutes, shoulder press, head press, leg lengthener, leg press  5 X 5 seconds, decompression 5 minutes, 2 pillows under each leg and under right arm     Knee/Hip Exercises: Stretches   Active Hamstring Stretch 3 reps;30 seconds   Active Hamstring Stretch Limitations HEP     Shoulder Exercises: Supine   Other Supine Exercises supine scapular stapilization: sash, ER, horizontal abduction 10 X each heavy cues initially.  green band initially changed to yellow band with dowel for grip right.  , narrow grip shoulder flexion 10 x each. ,,,,,,,,,,po                PT Education - 05/13/16 1734    Education provided Yes   Education Details HEP   Person(s) Educated Patient   Methods Explanation;Demonstration;Tactile cues;Verbal cues;Handout   Comprehension Verbalized understanding;Returned demonstration             PT Long Term Goals - 05/11/16 1427      PT LONG TERM GOAL #1   Title FOTO to 62% ability to indicate significant improvement in functional ability by 1/12   Baseline 47% at eval   Time 4   Period Weeks   Status New     PT LONG TERM  GOAL #2   Title Pt will demonstrate exercises to perform at the [facility] and verbalize understanding of necessary precautions   Baseline began educating and will progress as appropriate   Time 4   Period Weeks   Status New     PT LONG TERM GOAL #3   Title Pt will demo ability to navigate quick changes in direction as well as navigate unsteady surfaces to decrease risk of falls   Baseline multiple falls recently   Time 4   Period Weeks   Status New     PT LONG TERM GOAL #4   Title Pt will verbalize LBP <=2/0 and abilty to utilize stretches/exercises to provide necessary support to low back, back bracing only with very strenuous exercises   Baseline will educate and train as treatment progresses   Time 4   Period Weeks   Status New               Plan - 05/13/16 1736    Clinical Impression Statement Some right shoulder soreness noted post session today.  Low back pain 3/10 at end of  session (unchanged)  He was able to split wood today 3.5 hours with the use of a log splitter and his back brace.  Posture standing improved slightly post session.   PT Next Visit Plan hip abductor strength, periscapular strength, balance exercises   PT Home Exercise Plan upright posture/scapular retraction, GHJ ER green tband, clams feet elevated. 12/21 hamstring stretch   Consulted and Agree with Plan of Care Patient      Patient will benefit from skilled therapeutic intervention in order to improve the following deficits and impairments:  Increased muscle spasms, Decreased activity tolerance, Pain, Improper body mechanics, Impaired flexibility, Decreased strength, Postural dysfunction  Visit Diagnosis: Acute midline low back pain without sciatica  Repeated falls  Chronic pain of right knee  Acute pain of left knee     Problem List Patient Active Problem List   Diagnosis Date Noted  . Acute deep vein thrombosis (DVT) of distal end of right lower extremity ([place]) 09/23/2015  . 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 Orthostatic hypotension 08/15/2012  . Near syncope 08/15/2012  . GERD (gastroesophageal reflux disease) 08/15/2012  . Arthritis 08/15/2012  . BPH (benign prostatic hyperplasia) 08/15/2012    HARRIS,KAREN PTA 05/13/2016, 5:39 PM  [facility] [address] [place], [place], [address] Phone: [phone]   Fax:  [phone]  Name: Raymond Hopkins MRN: [id number] Date of Birth: 01-31-1935

## 2016-05-13 NOTE — Patient Instructions (Signed)
HAMSTRING STRETCH ISSUED FROM EXERCISE DRAWER Hands and towel at knee, straighten leg until you feel  a mild stretch. Daily 3 X 30 seconds each leg

## 2016-05-20 ENCOUNTER — Ambulatory Visit: Payer: Commercial Managed Care - HMO | Admitting: Physical Therapy

## 2016-05-20 ENCOUNTER — Encounter: Payer: Self-pay | Admitting: Physical Therapy

## 2016-05-20 DIAGNOSIS — M25561 Pain in right knee: Secondary | ICD-10-CM

## 2016-05-20 DIAGNOSIS — M545 Low back pain, unspecified: Secondary | ICD-10-CM

## 2016-05-20 DIAGNOSIS — M25562 Pain in left knee: Secondary | ICD-10-CM | POA: Diagnosis not present

## 2016-05-20 DIAGNOSIS — R296 Repeated falls: Secondary | ICD-10-CM

## 2016-05-20 DIAGNOSIS — G8929 Other chronic pain: Secondary | ICD-10-CM | POA: Diagnosis not present

## 2016-05-20 NOTE — Therapy (Signed)
[place], [place], [address] Phone: [phone]   Fax:  [phone]  Physical Therapy Treatment  Patient Details  Name: Raymond Hopkins MRN: [id number] Date of Birth: 09-Jan-1935 Referring Provider: Aura Dials, MD  Encounter Date: 05/20/2016      PT End of Session - 05/20/16 1249    Visit Number 4   Number of Visits 9   Date for PT Re-Evaluation 06/04/16   Authorization Type [employer] MCR   PT Start Time 1240   PT Stop Time 1320   PT Time Calculation (min) 40 min   Activity Tolerance Patient tolerated treatment well   Behavior During Therapy [facility] for tasks assessed/performed      Past Medical History:  Diagnosis Date  . Arthritis   . Bilateral cataracts   . Blood transfusion   . BPH (benign prostatic hyperplasia)   . Dementia    early onset per wife  . GERD (gastroesophageal reflux disease)   . Memory changes   . Right leg DVT ([place])    august 2016, on Xarelto for 6 months  . Seasonal allergies     Past Surgical History:  Procedure Laterality Date  . CARPAL TUNNEL RELEASE  1990   bilateral  . cyst on spine  1959  . NASAL SINUS SURGERY     numerous ENT surgeries  . PROSTATECTOMY N/A 10/13/2015   Procedure: SIMPLE OPEN RETROPUBIC PROSTATECTOMY ;  Surgeon: Carolan Clines, MD;  Location: [facility];  Service: Urology;  Laterality: N/A;  . SHOULDER SURGERY  2002   right  . TONSILLECTOMY      There were no vitals filed for this visit.      Subjective Assessment - 05/20/16 1244    Subjective Pt reporting no spliting wood today. Pt reporting pain of 2/10 at rest, but having a "catch" in his back this morning.    Limitations Lifting   How long can you sit comfortably? fidgets after a short time    Patient Stated Goals decreased pain, return to working out at [employer], chopping wood   Currently in Pain? Yes   Pain Score 2    Pain Location Back   Pain Orientation Left;Lower   Pain  Descriptors / Indicators Aching   Pain Type Acute pain   Pain Radiating Towards hamstring cramping   Pain Onset More than a month ago   Pain Frequency Intermittent   Aggravating Factors  chopping wood, handling/lifting wood   Pain Relieving Factors resting, tylenol   Multiple Pain Sites No                         [facility] Adult PT Treatment/Exercise - 05/20/16 0001      Exercises   Exercises Lumbar;Knee/Hip     Lumbar Exercises: Supine   Clam 15 reps   Bridge 10 reps;5 seconds   Bridge Limitations Feet on red ball   Other Supine Lumbar Exercises Decompression 5 minutes, shoulder press, head press, leg lengthener, leg press 5 X 5 seconds, decompression 5 minutes, 2 pillows under each leg and under right arm     Lumbar Exercises: Sidelying   Hip Abduction 10 reps  2 sets   Hip Abduction Weights (lbs) 3   Hip Abduction Limitations more difficutly on the left LE   Other Sidelying Lumbar Exercises hip adduction 10 reps      Knee/Hip Exercises: Stretches   Active Hamstring Stretch 3 reps;30 seconds   Active  Hamstring Stretch Limitations HEP review     Knee/Hip Exercises: Sidelying   Clams 10 reps 2 sets with feet elevated   Other Sidelying Knee/Hip Exercises Reverse clams 10 x 2 sets,                 PT Education - 05/20/16 1248    Education provided Yes   Education Details Importance of HEP   Person(s) Educated Patient   Methods Explanation;Demonstration   Comprehension Verbalized understanding;Returned demonstration             PT Long Term Goals - 05/20/16 1307      PT LONG TERM GOAL #1   Title FOTO to 62% ability to indicate significant improvement in functional ability by 1/12   Time 4   Period Weeks   Status Unable to assess     PT LONG TERM GOAL #2   Title Pt will demonstrate exercises to perform at the [facility] and verbalize understanding of necessary precautions   Baseline began educating and will progress as appropriate   Time 4    Period Weeks   Status New     PT LONG TERM GOAL #3   Title Pt will demo ability to navigate quick changes in direction as well as navigate unsteady surfaces to decrease risk of falls   Baseline multiple falls recently   Time 4   Period Weeks     PT LONG TERM GOAL #4   Title Pt will verbalize LBP <=2/0 and abilty to utilize stretches/exercises to provide necessary support to low back, back bracing only with very strenuous exercises   Baseline will educate and train as treatment progresses   Time 4   Period Weeks   Status New               Plan - 05/20/16 1251    Clinical Impression Statement Pt reporting low back pain which is greater on the left. Pain at 2/10. Pt reporting mild right shoulder stiffness this morning, but reporting no pain at present. Pt has not been doing wood spliting today. Pt tolerated session well. pt reported he has not been doing his home exercises daily.  Pt instructed in importance of home exercises to see improvements in strength and flexibility.  pt reporting mild discomfort at end of session rated 1/10.  Pt presenting with increased wekaness on the left more than the right.    Rehab Potential Good   Clinical Impairments Affecting Rehab Potential early onset dementia   PT Frequency 2x / week   PT Duration 4 weeks   PT Treatment/Interventions ADLs/Self Care Home Management;Cryotherapy;Electrical Stimulation;Iontophoresis 4mg /ml Dexamethasone;Functional mobility training;Stair training;Gait training;Ultrasound;Traction;Moist Heat;Therapeutic activities;Therapeutic exercise;Balance training;Neuromuscular re-education;Patient/family education;Passive range of motion;Manual techniques;Dry needling;Taping   PT Next Visit Plan hip abductor strength, periscapular strength, balance exercises   PT Home Exercise Plan upright posture/scapular retraction, GHJ ER green tband, clams feet elevated. 12/21 hamstring stretch   Consulted and Agree with Plan of Care Patient       Patient will benefit from skilled therapeutic intervention in order to improve the following deficits and impairments:  Increased muscle spasms, Decreased activity tolerance, Pain, Improper body mechanics, Impaired flexibility, Decreased strength, Postural dysfunction  Visit Diagnosis: Acute midline low back pain without sciatica  Repeated falls  Chronic pain of right knee  Acute pain of left knee     Problem List Patient Active Problem List   Diagnosis Date Noted  . Acute deep vein thrombosis (DVT) of distal end of right lower  extremity ([place]) 09/23/2015  . 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 Orthostatic hypotension 08/15/2012  . Near syncope 08/15/2012  . GERD (gastroesophageal reflux disease) 08/15/2012  . Arthritis 08/15/2012  . BPH (benign prostatic hyperplasia) 08/15/2012    Oretha Caprice, MPT 05/20/2016, 2:47 PM  [facility] [address] [place], [place], [address] Phone: [phone]   Fax:  [phone]  Name: CORKEY SISK MRN: [id number] Date of Birth: 1934-10-19

## 2016-05-25 ENCOUNTER — Ambulatory Visit: Payer: Commercial Managed Care - HMO | Attending: Family Medicine | Admitting: Physical Therapy

## 2016-05-25 DIAGNOSIS — M25562 Pain in left knee: Secondary | ICD-10-CM

## 2016-05-25 DIAGNOSIS — R296 Repeated falls: Secondary | ICD-10-CM

## 2016-05-25 DIAGNOSIS — M25561 Pain in right knee: Secondary | ICD-10-CM | POA: Diagnosis not present

## 2016-05-25 DIAGNOSIS — M545 Low back pain, unspecified: Secondary | ICD-10-CM

## 2016-05-25 DIAGNOSIS — G8929 Other chronic pain: Secondary | ICD-10-CM | POA: Diagnosis not present

## 2016-05-25 NOTE — Patient Instructions (Signed)

## 2016-05-25 NOTE — Therapy (Signed)
[place], [place], [address] Phone: [phone]   Fax:  [phone]  Physical Therapy Treatment  Patient Details  Name: Raymond Hopkins MRN: [id number] Date of Birth: 03/07/1935 Referring Provider: Aura Dials, MD  Encounter Date: 05/25/2016      PT End of Session - 05/25/16 1644    Visit Number 5   Number of Visits 9   Date for PT Re-Evaluation 06/04/16   PT Start Time [id number]   PT Stop Time 1630   PT Time Calculation (min) 45 min   Activity Tolerance Patient tolerated treatment well   Behavior During Therapy [facility] for tasks assessed/performed      Past Medical History:  Diagnosis Date  . Arthritis   . Bilateral cataracts   . Blood transfusion   . BPH (benign prostatic hyperplasia)   . Dementia    early onset per wife  . GERD (gastroesophageal reflux disease)   . Memory changes   . Right leg DVT ([place])    august 2016, on Xarelto for 6 months  . Seasonal allergies     Past Surgical History:  Procedure Laterality Date  . CARPAL TUNNEL RELEASE  1990   bilateral  . cyst on spine  1959  . NASAL SINUS SURGERY     numerous ENT surgeries  . PROSTATECTOMY N/A 10/13/2015   Procedure: SIMPLE OPEN RETROPUBIC PROSTATECTOMY ;  Surgeon: Carolan Clines, MD;  Location: [facility];  Service: Urology;  Laterality: N/A;  . SHOULDER SURGERY  2002   right  . TONSILLECTOMY      There were no vitals filed for this visit.      Subjective Assessment - 05/25/16 1548    Subjective 3/10.  pain gets worse if I over due it.  I have been doing exercises some, not as much as I should.   Pain Score 3    Pain Location Back   Pain Orientation Right;Left;Lower   Pain Descriptors / Indicators Aching   Pain Frequency Intermittent   Aggravating Factors  chopping wood,, overdoing.    Pain Relieving Factors resting                         [facility] Adult PT Treatment/Exercise - 05/25/16 0001      Self-Care   Self-Care Lifting;Posture   Lifting education with pole, 2 kettle balls. heavy cues.   Posture standing, sitting posture education,  lumbar support,  ADL handout review revealed he has been lifting with forward trunk,     Lumbar Exercises: Stretches   Passive Hamstring Stretch 3 reps;30 seconds   Passive Hamstring Stretch Limitations left stiffer than right today  Hip IR stretch 10 seconds 3 X each,  stiff     Lumbar Exercises: Supine   Bridge 10 reps;5 seconds   Bridge Limitations calf on red ball   Other Supine Lumbar Exercises encouraged aptient to do     Lumbar Exercises: Sidelying   Other Sidelying Lumbar Exercises --     Knee/Hip Exercises: Sidelying   Clams 10 reps 2 sets with feet elevated  min assist for hip position, compensation     Shoulder Exercises: Stretch   Other Shoulder Stretches ook openers 5X     Moist Heat Therapy   Number Minutes Moist Heat 30 Minutes  concurrent with supine exercises   Moist Heat Location Lumbar Spine     Manual Therapy   Manual Therapy Soft tissue mobilization   Manual  therapy comments paraspinals softened,  they were rigid initially. low back to mid back and right rib area.                PT Education - 05/25/16 1643    Education provided Yes   Education Details Posture, lifting education, ADL   Methods Explanation;Demonstration;Tactile cues;Verbal cues;Handout   Comprehension Verbalized understanding;Returned demonstration;Need further instruction             PT Long Term Goals - 05/25/16 1648      PT LONG TERM GOAL #1   Title FOTO to 62% ability to indicate significant improvement in functional ability by 1/12   Time 4   Period Weeks   Status Unable to assess     PT LONG TERM GOAL #2   Title Pt will demonstrate exercises to perform at the [facility] and verbalize understanding of necessary precautions   Time 4   Period Weeks   Status On-going     PT LONG TERM GOAL #3   Title Pt will demo ability  to navigate quick changes in direction as well as navigate unsteady surfaces to decrease risk of falls   Time 4   Period Weeks   Status Unable to assess     PT LONG TERM GOAL #4   Title Pt will verbalize LBP <=2/0 and abilty to utilize stretches/exercises to provide necessary support to low back, back bracing only with very strenuous exercises   Baseline education continuing.  pain 3-4/10   Time 4   Period Weeks   Status On-going               Plan - 05/25/16 1644    Clinical Impression Statement Pain decreased to 1/10 right low back at end of sewssion.  Education with posture continued with lifting education.  Heavy cues needed.  He maintains the only way to split wood is in the forward trunk flexion position.  Preogress toward his posture goals.  Pain has not significantly changed.  Patient notes he has right groin swelling and pain up to 5/10 which he relates to his prostrate surgery.  He plans to discuss with his MD at an appointment within the next 2 weeks.    PT Next Visit Plan hip abductor strength, periscapular strength, balance exercises   PT Home Exercise Plan upright posture/scapular retraction, GHJ ER green tband, clams feet elevated. 12/21 hamstring stretch1/02 practice correct lifting techniques.   Consulted and Agree with Plan of Care Patient      Patient will benefit from skilled therapeutic intervention in order to improve the following deficits and impairments:  Increased muscle spasms, Decreased activity tolerance, Pain, Improper body mechanics, Impaired flexibility, Decreased strength, Postural dysfunction  Visit Diagnosis: Acute midline low back pain without sciatica  Repeated falls  Chronic pain of right knee  Acute pain of left knee     Problem List Patient Active Problem List   Diagnosis Date Noted  . Acute deep vein thrombosis (DVT) of distal end of right lower extremity ([place]) 09/23/2015  . Amnestic MCI (mild cognitive impairment with memory  loss) 10/29/2014  . Orthostatic hypotension 08/15/2012  . Near syncope 08/15/2012  . GERD (gastroesophageal reflux disease) 08/15/2012  . Arthritis 08/15/2012  . BPH (benign prostatic hyperplasia) 08/15/2012    Raymond Hopkins  PTA 05/25/2016, 4:53 PM  [facility] [address] [place], [place], [address] Phone: [phone]   Fax:  [phone]  Name: Raymond Hopkins MRN: [id number] Date of Birth:  05/24/1934    

## 2016-05-27 ENCOUNTER — Ambulatory Visit: Payer: Commercial Managed Care - HMO | Admitting: Physical Therapy

## 2016-05-31 ENCOUNTER — Ambulatory Visit: Payer: Commercial Managed Care - HMO | Admitting: Physical Therapy

## 2016-06-02 ENCOUNTER — Ambulatory Visit: Payer: Commercial Managed Care - HMO | Admitting: Physical Therapy

## 2016-06-02 ENCOUNTER — Encounter: Payer: Self-pay | Admitting: Physical Therapy

## 2016-06-02 DIAGNOSIS — G8929 Other chronic pain: Secondary | ICD-10-CM

## 2016-06-02 DIAGNOSIS — R296 Repeated falls: Secondary | ICD-10-CM | POA: Diagnosis not present

## 2016-06-02 DIAGNOSIS — M25562 Pain in left knee: Secondary | ICD-10-CM | POA: Diagnosis not present

## 2016-06-02 DIAGNOSIS — M545 Low back pain, unspecified: Secondary | ICD-10-CM

## 2016-06-02 DIAGNOSIS — M25561 Pain in right knee: Secondary | ICD-10-CM

## 2016-06-02 NOTE — Therapy (Signed)
[place], [place], [address] Phone: [phone]   Fax:  [phone]  Physical Therapy Treatment/Discharge Summary  Patient Details  Name: Raymond Hopkins MRN: [id number] Date of Birth: [birth year] Referring Provider: Aura Dials, MD  Encounter Date: 06/02/2016      PT End of Session - 06/02/16 1647    Visit Number 6   Number of Visits 9   Date for PT Re-Evaluation 06/04/16   Authorization Type [employer] MCR   PT Start Time 1628   PT Stop Time 1721   PT Time Calculation (min) 53 min   Activity Tolerance Patient tolerated treatment well   Behavior During Therapy [facility] for tasks assessed/performed      Past Medical History:  Diagnosis Date  . Arthritis   . Bilateral cataracts   . Blood transfusion   . BPH (benign prostatic hyperplasia)   . Dementia    early onset per wife  . GERD (gastroesophageal reflux disease)   . Memory changes   . Right leg DVT ([place])    august 2016, on Xarelto for 6 months  . Seasonal allergies     Past Surgical History:  Procedure Laterality Date  . CARPAL TUNNEL RELEASE  1990   bilateral  . cyst on spine  1959  . NASAL SINUS SURGERY     numerous ENT surgeries  . PROSTATECTOMY N/A 10/13/2015   Procedure: SIMPLE OPEN RETROPUBIC PROSTATECTOMY ;  Surgeon: Carolan Clines, MD;  Location: [facility];  Service: Urology;  Laterality: N/A;  . SHOULDER SURGERY  2002   right  . TONSILLECTOMY      There were no vitals filed for this visit.      Subjective Assessment - 06/02/16 1631    Subjective Back hurts, comes and goes, L>R but moves side to side. L knee has problems. Tired after driving to [place] for the night and drove back. 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 REports low pain in back today because "I haven't been doing much" Pt has requested to be d/c at this time to independent program for financial reasons.     Patient Stated Goals decreased pain, return to working out  at [employer], chopping wood   Currently in Pain? Yes   Pain Score 1    Pain Location Back   Pain Orientation Lower   Pain Descriptors / Indicators Sore            [facility] PT Assessment - 06/02/16 0001      Observation/Other Assessments   Focus on Therapeutic Outcomes (FOTO)  61% ability                     [facility] Adult PT Treatment/Exercise - 06/02/16 0001      Lumbar Exercises: Standing   Other Standing Lumbar Exercises body blade with posture 2x30s each arm     Lumbar Exercises: Supine   AB Set Limitations decompression leg lengthener review   Bridge 15 reps   Bridge Limitations ball squeeze bw knees, in DF   Other Supine Lumbar Exercises Diagonal pulls red tband opp arm/foot   Other Supine Lumbar Exercises LE table top holds with ball squeeze     Lumbar Exercises: Quadruped   Plank primal push ups 5x10s holds                PT Education - 06/02/16 1714    Education provided Yes   Education Details exercise form/rationale, HEP, what to avoid at the  gym.              PT Long Term Goals - 06-07-2016 1959      PT LONG TERM GOAL #1   Title FOTO to 62% ability to indicate significant improvement in functional ability by 1/12   Baseline 61% ability at d/c   Status Not Met     PT LONG TERM GOAL #2   Title Pt will demonstrate exercises to perform at the [facility] and verbalize understanding of necessary precautions   Baseline pt was educated and verbalized understanding    Status Achieved     PT LONG TERM GOAL #3   Title Pt will demo ability to navigate quick changes in direction as well as navigate unsteady surfaces to decrease risk of falls   Status Unable to assess     PT LONG TERM GOAL #4   Title Pt will verbalize LBP <=2/0 and abilty to utilize stretches/exercises to provide necessary support to low back, back bracing only with very strenuous exercises   Status Achieved               Plan - 06/07/16 2025    Clinical Impression Statement  Pt verbalized readiness for d/c today and has been released with a home exercise program. Pt is very active and was encouraged to be aware of abdominal control for spinal support when doing daily activities. Pt verbalized comfort and understanding and was instructed to contact us with any further questions and needs.    Consulted and Agree with Plan of Care Patient      Patient will benefit from skilled therapeutic intervention in order to improve the following deficits and impairments:     Visit Diagnosis: Acute midline low back pain without sciatica  Repeated falls  Chronic pain of right knee  Acute pain of left knee       G-Codes - 07-Jun-2016 2002-07-09    Functional Assessment Tool Used FOTO 61% ability (goal 62%), clinical judgement   Functional Limitation Mobility: Walking and moving around   Mobility: Walking and Moving Around Goal Status [phone]) At least 20 percent but less than 40 percent impaired, limited or restricted   Mobility: Walking and Moving Around Discharge Status [phone]) At least 20 percent but less than 40 percent impaired, limited or restricted      Problem List Patient Active Problem List   Diagnosis Date Noted  . Acute deep vein thrombosis (DVT) of distal end of right lower extremity ([place]) 09/23/2015  . 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 Orthostatic hypotension 08/15/2012  . Near syncope 08/15/2012  . GERD (gastroesophageal reflux disease) 08/15/2012  . Arthritis 08/15/2012  . BPH (benign prostatic hyperplasia) 08/15/2012   PHYSICAL THERAPY DISCHARGE SUMMARY  Visits from Start of Care: 6  Current functional level related to goals / functional outcomes: See above   Remaining deficits: See above   Education / Equipment: Anatomy of condition, POC, HEP, exercise form/rationale  Plan: Patient agrees to discharge.  Patient goals were not met. Patient is being discharged due to financial reasons.  ?????    Lyberti Thrush C.  Adlyn Fife PT, DPT June 07, 2016 8:33 PM   [facility] [address] [place], [place], [address] Phone: [phone]   Fax:  [phone]  Name: Raymond Hopkins MRN: [id number] Date of Birth: Jul 10, 1934

## 2016-06-08 ENCOUNTER — Encounter (HOSPITAL_COMMUNITY): Payer: Self-pay

## 2016-06-08 DIAGNOSIS — Z87891 Personal history of nicotine dependence: Secondary | ICD-10-CM | POA: Diagnosis not present

## 2016-06-08 DIAGNOSIS — R079 Chest pain, unspecified: Secondary | ICD-10-CM | POA: Diagnosis not present

## 2016-06-08 DIAGNOSIS — E860 Dehydration: Secondary | ICD-10-CM | POA: Diagnosis not present

## 2016-06-08 DIAGNOSIS — R531 Weakness: Secondary | ICD-10-CM | POA: Diagnosis not present

## 2016-06-08 DIAGNOSIS — K449 Diaphragmatic hernia without obstruction or gangrene: Secondary | ICD-10-CM | POA: Diagnosis not present

## 2016-06-08 DIAGNOSIS — R109 Unspecified abdominal pain: Secondary | ICD-10-CM | POA: Diagnosis present

## 2016-06-08 DIAGNOSIS — Z7982 Long term (current) use of aspirin: Secondary | ICD-10-CM | POA: Insufficient documentation

## 2016-06-08 LAB — I-STAT TROPONIN, ED: Troponin i, poc: 0 ng/mL (ref 0.00–0.08)

## 2016-06-08 NOTE — ED Triage Notes (Signed)
Pt reports dizziness that began this evening with nausea. Pt had been working outside cutting wood prior to event. Pt began new dementia medication today. Pt hypertensive in triage 179/71, other VSS.

## 2016-06-09 ENCOUNTER — Emergency Department (HOSPITAL_COMMUNITY)
Admission: EM | Admit: 2016-06-09 | Discharge: 2016-06-09 | Disposition: A | Discharge status: Home / Self Care | Payer: Commercial Managed Care - HMO | Attending: Emergency Medicine | Admitting: Emergency Medicine

## 2016-06-09 ENCOUNTER — Emergency Department (HOSPITAL_COMMUNITY): Payer: Commercial Managed Care - HMO

## 2016-06-09 ENCOUNTER — Encounter (HOSPITAL_COMMUNITY): Payer: Self-pay

## 2016-06-09 DIAGNOSIS — R531 Weakness: Secondary | ICD-10-CM

## 2016-06-09 DIAGNOSIS — K449 Diaphragmatic hernia without obstruction or gangrene: Secondary | ICD-10-CM | POA: Diagnosis not present

## 2016-06-09 DIAGNOSIS — E860 Dehydration: Secondary | ICD-10-CM

## 2016-06-09 DIAGNOSIS — R079 Chest pain, unspecified: Secondary | ICD-10-CM | POA: Diagnosis not present

## 2016-06-09 LAB — CBC
HCT: 36.9 % — ABNORMAL LOW (ref 39.0–52.0)
Hemoglobin: 12.5 g/dL — ABNORMAL LOW (ref 13.0–17.0)
MCH: 30.3 pg (ref 26.0–34.0)
MCHC: 33.9 g/dL (ref 30.0–36.0)
MCV: 89.6 fL (ref 78.0–100.0)
PLATELETS: 155 10*3/uL (ref 150–400)
RBC: 4.12 MIL/uL — ABNORMAL LOW (ref 4.22–5.81)
RDW: 13 % (ref 11.5–15.5)
WBC: 5.5 10*3/uL (ref 4.0–10.5)

## 2016-06-09 LAB — COMPREHENSIVE METABOLIC PANEL
ALT: 18 U/L (ref 17–63)
AST: 31 U/L (ref 15–41)
Albumin: 4 g/dL (ref 3.5–5.0)
Alkaline Phosphatase: 60 U/L (ref 38–126)
Anion gap: 8 (ref 5–15)
BILIRUBIN TOTAL: 0.9 mg/dL (ref 0.3–1.2)
BUN: 17 mg/dL (ref 6–20)
CALCIUM: 10 mg/dL (ref 8.9–10.3)
CO2: 23 mmol/L (ref 22–32)
CREATININE: 1.16 mg/dL (ref 0.61–1.24)
Chloride: 104 mmol/L (ref 101–111)
GFR, EST NON AFRICAN AMERICAN: 58 mL/min — AB (ref >60)
Glucose, Bld: 112 mg/dL — ABNORMAL HIGH (ref 65–99)
Potassium: 4.4 mmol/L (ref 3.5–5.1)
Sodium: 135 mmol/L (ref 135–145)
TOTAL PROTEIN: 7 g/dL (ref 6.5–8.1)

## 2016-06-09 LAB — URINALYSIS, ROUTINE W REFLEX MICROSCOPIC
Bilirubin Urine: NEGATIVE
GLUCOSE, UA: NEGATIVE mg/dL
Hgb urine dipstick: NEGATIVE
KETONES UR: 5 mg/dL — AB
LEUKOCYTES UA: NEGATIVE
NITRITE: NEGATIVE
PROTEIN: NEGATIVE mg/dL
Specific Gravity, Urine: 1.011 (ref 1.005–1.030)
pH: 9 — ABNORMAL HIGH (ref 5.0–8.0)

## 2016-06-09 LAB — D-DIMER, QUANTITATIVE (NOT AT ARMC): D DIMER QUANT: 0.89 ug{FEU}/mL — AB (ref 0.00–0.50)

## 2016-06-09 LAB — LIPASE, BLOOD: LIPASE: 33 U/L (ref 11–51)

## 2016-06-09 MED ORDER — ONDANSETRON HCL 4 MG/2ML IJ SOLN
4.0000 mg | Freq: Once | INTRAMUSCULAR | Status: AC
  Administered 2016-06-09: 4 mg via INTRAVENOUS
  Filled 2016-06-09: qty 2

## 2016-06-09 MED ORDER — SODIUM CHLORIDE 0.9 % IV BOLUS (SEPSIS)
500.0000 mL | Freq: Once | INTRAVENOUS | Status: AC
  Administered 2016-06-09: 500 mL via INTRAVENOUS

## 2016-06-09 MED ORDER — IOPAMIDOL (ISOVUE-370) INJECTION 76%
INTRAVENOUS | Status: AC
  Administered 2016-06-09: 100 mL
  Filled 2016-06-09: qty 100

## 2016-06-09 NOTE — ED Provider Notes (Signed)
[facility] DEPT Provider Note   CSN: [id number] Arrival date & time: 06/08/16  2329   By signing my name below, I, Delton Prairie, attest that this documentation has been prepared under the direction and in the presence of Orpah Greek, MD  Electronically Signed: Delton Prairie, ED Scribe. 06/09/16. 1:31 AM.   History   Chief Complaint Chief Complaint  Patient presents with  . Nausea  . Abdominal Pain  . Dizziness   The history is provided by the patient and the spouse. No language interpreter was used.   HPI Comments:  Raymond Hopkins is a 82 y.o. male, with a hx of DVT, GERD and dementia, who presents to the Emergency Department complaining of lightheadedness onset 8:30 PM today. Relative also reports nausea, abdominal discomfort and blurry vision. She states the pt was cutting wood for several hour before his symptoms began. She called the pt's PCP and was advised to visit the ED. Pt reports his symptoms have improved about "5-10%" upon arrival to the ED. He drank water and ate some jello with no relief. Pt denies SOB and any other associated symptoms at this time. Wife reports pt was taken off of Xarelto 1 year ago.   Past Medical History:  Diagnosis Date  . Arthritis   . Bilateral cataracts   . Blood transfusion   . BPH (benign prostatic hyperplasia)   . Dementia    early onset per wife  . GERD (gastroesophageal reflux disease)   . Memory changes   . Right leg DVT ([place])    august 2016, on Xarelto for 6 months  . Seasonal allergies     Patient Active Problem List   Diagnosis Date Noted  . Acute deep vein thrombosis (DVT) of distal end of right lower extremity ([place]) 09/23/2015  . 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 Orthostatic hypotension 08/15/2012  . Near syncope 08/15/2012  . GERD (gastroesophageal reflux disease) 08/15/2012  . Arthritis 08/15/2012  . BPH (benign prostatic hyperplasia) 08/15/2012    Past Surgical  History:  Procedure Laterality Date  . CARPAL TUNNEL RELEASE  1990   bilateral  . cyst on spine  1959  . NASAL SINUS SURGERY     numerous ENT surgeries  . PROSTATECTOMY N/A 10/13/2015   Procedure: SIMPLE OPEN RETROPUBIC PROSTATECTOMY ;  Surgeon: Carolan Clines, MD;  Location: [facility] OR[facility];  Service: Urology;  Laterality: N/A;  . SHOULDER SURGERY  2002   right  . TONSILLECTOMY         Home Medications    Prior to Admission medications   Medication Sig Start Date End Date Taking? Authorizing Provider  aspirin 81 MG tablet Take 81 mg by mouth daily.   Yes Historical Provider, MD  celecoxib (CELEBREX) 200 MG capsule Take 200 mg by mouth daily.    Yes Historical Provider, MD  cholecalciferol (VITAMIN D) 1000 UNITS tablet Take 1,000 Units by mouth daily.    Yes Historical Provider, MD  ferrous sulfate 325 (65 FE) MG tablet Take 325 mg by mouth daily with breakfast.   Yes Historical Provider, MD  fluticasone (FLONASE) 50 MCG/ACT nasal spray Place 1 spray into both nostrils daily. Patient taking differently: Place 1 spray into both nostrils daily as needed for allergies.  03/30/13  Yes Noemi Chapel, MD  magnesium gluconate (MAGONATE) 500 MG tablet Take 500 mg by mouth daily.    Yes Historical Provider, MD  mirabegron ER (MYRBETRIQ) 50 MG TB24 tablet Take 50 mg by  mouth every evening.   Yes Historical Provider, MD  pantoprazole (PROTONIX) 40 MG tablet Take 40 mg by mouth daily.   Yes Historical Provider, MD  pyridOXINE (VITAMIN B-6) 100 MG tablet Take 100 mg by mouth daily.   Yes Historical Provider, MD  rivastigmine (EXELON) 6 MG capsule Take 1 capsule (6 mg total) by mouth 2 (two) times daily. 03/08/16  Yes Adam Telford Nab, DO  sildenafil (VIAGRA) 100 MG tablet Take 100 mg by mouth 2 (two) times a week.    Yes Historical Provider, MD  traMADol (ULTRAM) 50 MG tablet Take 50 mg by mouth every 6 (six) hours as needed for moderate pain.    Yes Historical Provider, MD  vitamin B-12 (CYANOCOBALAMIN)  500 MCG tablet Take 500 mcg by mouth daily.    Yes Historical Provider, MD  rivastigmine (EXELON) 1.5 MG capsule 1.5mg  twice daily for 2 weeks, then 3mg  twice daily for 2 weeks, Patient not taking: Reported on 06/09/2016 01/05/16 06/09/16  Pieter Partridge, DO  rivastigmine (EXELON) 1.5 MG capsule Take 4.5mg   twice daily for 2 weeks, then goal of 6mg  twice daily. Patient not taking: Reported on 06/09/2016 02/03/16 06/09/16  Pieter Partridge, DO    Family History Family History  Problem Relation Age of Onset  . Dementia Father   . Dementia Brother   . Colon cancer Neg Hx   . Stomach cancer Neg Hx     Social History Social History  Substance Use Topics  . Smoking status: Former Smoker    Types: Cigarettes    Quit date: 10/06/1963  . Smokeless tobacco: Never Used  . Alcohol use No     Allergies   Patient has no known allergies.   Review of Systems Review of Systems  Respiratory: Negative for shortness of breath.   Gastrointestinal: Positive for abdominal pain and nausea.  Neurological: Positive for light-headedness.  All other systems reviewed and are negative.  Physical Exam Updated Vital Signs BP 118/69   Pulse (!) 50   Temp 98.1 F (36.7 C) (Oral)   Resp 14   Ht 5\' 9"  (1.753 m)   Wt 160 lb (72.6 kg)   SpO2 96%   BMI 23.63 kg/m   Physical Exam  Constitutional: He is oriented to person, place, and time. He appears well-developed and well-nourished. No distress.  HENT:  Head: Normocephalic and atraumatic.  Right Ear: Hearing normal.  Left Ear: Hearing normal.  Nose: Nose normal.  Mouth/Throat: Oropharynx is clear and moist and mucous membranes are normal.  Eyes: Conjunctivae and EOM are normal. Pupils are equal, round, and reactive to light.  Neck: Normal range of motion. Neck supple.  Cardiovascular: Regular rhythm, S1 normal and S2 normal.  Exam reveals no gallop and no friction rub.   No murmur heard. Pulmonary/Chest: Effort normal and breath sounds normal. No  respiratory distress. He exhibits no tenderness.  Abdominal: Soft. Normal appearance and bowel sounds are normal. There is no hepatosplenomegaly. There is tenderness. There is guarding (voluntary). There is no rebound, no tenderness at McBurney's point and negative Murphy's sign. No hernia.  Diffuse upper abdominal tenderness  Musculoskeletal: Normal range of motion.  Neurological: He is alert and oriented to person, place, and time. He has normal strength. No cranial nerve deficit or sensory deficit. Coordination normal. GCS eye subscore is 4. GCS verbal subscore is 5. GCS motor subscore is 6.  Skin: Skin is warm, dry and intact. No rash noted. No cyanosis.  Psychiatric: He has a  normal mood and affect. His speech is normal and behavior is normal. Thought content normal.  Nursing note and vitals reviewed.    ED Treatments / Results  DIAGNOSTIC STUDIES:  Oxygen Saturation is 98% on RA, normal by my interpretation.    COORDINATION OF CARE:  1:28 AM Discussed treatment plan with pt at bedside and pt agreed to plan.  Labs (all labs ordered are listed, but only abnormal results are displayed) Labs Reviewed  COMPREHENSIVE METABOLIC PANEL - Abnormal; Notable for the following:       Result Value   Glucose, Bld 112 (*)    GFR calc non Af Amer 58 (*)    All other components within normal limits  CBC - Abnormal; Notable for the following:    RBC 4.12 (*)    Hemoglobin 12.5 (*)    HCT 36.9 (*)    All other components within normal limits  URINALYSIS, ROUTINE W REFLEX MICROSCOPIC - Abnormal; Notable for the following:    APPearance HAZY (*)    pH 9.0 (*)    Ketones, ur 5 (*)    All other components within normal limits  D-DIMER, QUANTITATIVE (NOT AT [facility]) - Abnormal; Notable for the following:    D-Dimer, Quant 0.89 (*)    All other components within normal limits  LIPASE, BLOOD  I-STAT TROPOININ, ED    EKG  EKG Interpretation  Date/Time:  Tuesday June 08 2016 23:41:19  EST Ventricular Rate:  59 PR Interval:  234 QRS Duration: 78 QT Interval:  426 QTC Calculation: 421 R Axis:   41 Text Interpretation:  Sinus bradycardia with 1st degree A-V block Low voltage QRS Borderline ECG No significant change since last tracing Confirmed by Romanda Turrubiates  MD, Harrell Gave [phone]) on 06/09/2016 1:22:30 AM Also confirmed by Betsey Holiday  MD, Pranathi Winfree [phone]), editor [place] CT, Leda Gauze [phone])  on 06/09/2016 7:05:20 AM       Radiology Ct Angio Chest Pe W Or Wo Contrast  Result Date: 06/09/2016 CLINICAL DATA:  82 year old male with chest pain dizziness abdominal pain and nausea. Hypertensive at presentation. Initial encounter. EXAM: CT ANGIOGRAPHY CHEST CT ABDOMEN AND PELVIS WITH CONTRAST TECHNIQUE: Multidetector CT imaging of the chest was performed using the standard protocol during bolus administration of intravenous contrast. Multiplanar CT image reconstructions and MIPs were obtained to evaluate the vascular anatomy. Multidetector CT imaging of the abdomen and pelvis was performed using the standard protocol during bolus administration of intravenous contrast. CONTRAST:  100 mL Isovue 370 COMPARISON:  Chest CTA 01/22/2015. FINDINGS: CTA CHEST FINDINGS Cardiovascular: Good contrast bolus timing in the pulmonary arterial tree. No focal filling defect identified in the pulmonary arteries to suggest acute pulmonary embolism. Stable mild cardiomegaly. No pericardial effusion. Calcified coronary artery atherosclerosis. Mostly soft thoracic aorta atherosclerotic plaque. Mediastinum/Nodes: Moderate to large gastric hiatal hernia has not significantly changed. No mediastinal lymphadenopathy. Lungs/Pleura: Major airways are patent. Similar lung volumes. Dependent and perihilar pulmonary atelectasis. No pleural effusion. Occasional lower lobe subpleural areas of gas trapping. Superior segment right lower lobe calcified granuloma. Musculoskeletal: Flowing thoracic endplate osteophytosis. Exaggerated  thoracic kyphosis. No acute osseous abnormality identified. Review of the MIP images confirms the above findings. CT ABDOMEN and PELVIS FINDINGS Hepatobiliary: Scattered spot subcentimeter low-density areas in the liver dome most resemble benign cysts. Otherwise negative liver and gallbladder. No biliary ductal enlargement. Pancreas: Negative pancreas. Spleen: Negative except for several calcified granulomas. Adrenals/Urinary Tract: Normal adrenal glands. Bilateral renal enhancement and contrast excretion is normal. Negative course of both ureters. Unremarkable  urinary bladder. Stomach/Bowel: Negative rectum. Redundant sigmoid colon tracking up to the epigastrium with mild to moderate diverticulosis, but no active inflammation identified. Minimal diverticula in the left colon. Redundant splenic flexure. Retained stool throughout the transverse colon and both flexures. The hepatic flexure is redundant. Negative right colon. Negative appendix. No dilated small bowel. Moderate to large gastric hernia. The intraabdominal portion of the stomach is negative. Negative duodenum. Vascular/Lymphatic: Aortoiliac calcified atherosclerosis noted. Major arterial structures in the abdomen and pelvis are patent with mild dolichoectasia. Portal venous system is patent. No lymphadenopathy. Reproductive: Negative aside from mild to moderate prostate enlargement (48 mm diameter). Other: No abdominal or pelvic free fluid. Musculoskeletal: Widespread advanced lumbar disc degeneration. Mild grade 1 anterolisthesis in the lower lumbar spine. Moderate to severe lower lumbar facet arthropathy. No acute osseous abnormality identified. Review of the MIP images confirms the above findings. IMPRESSION: 1.  No evidence of acute pulmonary embolus. 2. No inflammatory process identified in the chest abdomen or pelvis. 3. Calcified coronary artery and aortic atherosclerosis. 4. Pulmonary atelectasis. 5. Moderate to large gastric hiatal hernia. 6.  Sigmoid diverticulosis without active inflammation. Negative appendix. Electronically Signed   By: Genevie Ann M.D.   On: 06/09/2016 06:55   Ct Abdomen Pelvis W Contrast  Result Date: 06/09/2016 CLINICAL DATA:  82 year old male with chest pain dizziness abdominal pain and nausea. Hypertensive at presentation. Initial encounter. EXAM: CT ANGIOGRAPHY CHEST CT ABDOMEN AND PELVIS WITH CONTRAST TECHNIQUE: Multidetector CT imaging of the chest was performed using the standard protocol during bolus administration of intravenous contrast. Multiplanar CT image reconstructions and MIPs were obtained to evaluate the vascular anatomy. Multidetector CT imaging of the abdomen and pelvis was performed using the standard protocol during bolus administration of intravenous contrast. CONTRAST:  100 mL Isovue 370 COMPARISON:  Chest CTA 01/22/2015. FINDINGS: CTA CHEST FINDINGS Cardiovascular: Good contrast bolus timing in the pulmonary arterial tree. No focal filling defect identified in the pulmonary arteries to suggest acute pulmonary embolism. Stable mild cardiomegaly. No pericardial effusion. Calcified coronary artery atherosclerosis. Mostly soft thoracic aorta atherosclerotic plaque. Mediastinum/Nodes: Moderate to large gastric hiatal hernia has not significantly changed. No mediastinal lymphadenopathy. Lungs/Pleura: Major airways are patent. Similar lung volumes. Dependent and perihilar pulmonary atelectasis. No pleural effusion. Occasional lower lobe subpleural areas of gas trapping. Superior segment right lower lobe calcified granuloma. Musculoskeletal: Flowing thoracic endplate osteophytosis. Exaggerated thoracic kyphosis. No acute osseous abnormality identified. Review of the MIP images confirms the above findings. CT ABDOMEN and PELVIS FINDINGS Hepatobiliary: Scattered spot subcentimeter low-density areas in the liver dome most resemble benign cysts. Otherwise negative liver and gallbladder. No biliary ductal enlargement.  Pancreas: Negative pancreas. Spleen: Negative except for several calcified granulomas. Adrenals/Urinary Tract: Normal adrenal glands. Bilateral renal enhancement and contrast excretion is normal. Negative course of both ureters. Unremarkable urinary bladder. Stomach/Bowel: Negative rectum. Redundant sigmoid colon tracking up to the epigastrium with mild to moderate diverticulosis, but no active inflammation identified. Minimal diverticula in the left colon. Redundant splenic flexure. Retained stool throughout the transverse colon and both flexures. The hepatic flexure is redundant. Negative right colon. Negative appendix. No dilated small bowel. Moderate to large gastric hernia. The intraabdominal portion of the stomach is negative. Negative duodenum. Vascular/Lymphatic: Aortoiliac calcified atherosclerosis noted. Major arterial structures in the abdomen and pelvis are patent with mild dolichoectasia. Portal venous system is patent. No lymphadenopathy. Reproductive: Negative aside from mild to moderate prostate enlargement (48 mm diameter). Other: No abdominal or pelvic free fluid. Musculoskeletal: Widespread advanced lumbar disc  degeneration. Mild grade 1 anterolisthesis in the lower lumbar spine. Moderate to severe lower lumbar facet arthropathy. No acute osseous abnormality identified. Review of the MIP images confirms the above findings. IMPRESSION: 1.  No evidence of acute pulmonary embolus. 2. No inflammatory process identified in the chest abdomen or pelvis. 3. Calcified coronary artery and aortic atherosclerosis. 4. Pulmonary atelectasis. 5. Moderate to large gastric hiatal hernia. 6. Sigmoid diverticulosis without active inflammation. Negative appendix. Electronically Signed   By: Genevie Ann M.D.   On: 06/09/2016 06:55    Procedures Procedures (including critical care time)  Medications Ordered in ED Medications  sodium chloride 0.9 % bolus 500 mL (0 mLs Intravenous Stopped 06/09/16 0219)  ondansetron  (ZOFRAN) injection 4 mg (4 mg Intravenous Given 06/09/16 0157)  iopamidol (ISOVUE-370) 76 % injection (100 mLs  Contrast Given 06/09/16 0530)     Initial Impression / Assessment and Plan / ED Course  I have reviewed the triage vital signs and the nursing notes.  Pertinent labs & imaging results that were available during my care of the patient were reviewed by me and considered in my medical decision making (see chart for details).  Clinical Course    Patient presents to the emergency department for evaluation of weakness, dizziness, near syncope. Patient had been chopping wood outside prior to onset of symptoms. He denied any chest pain or shortness of breath. He thinks that he overdid it and got so dehydrated, although he did start any medication yesterday for his memory loss. Patient's workup has been entirely unremarkable. This included CT angiogram he has a history of DVT. He also underwent CT abdomen and pelvis before his abdominal discomfort. This was negative. Blood work unremarkable. Patient is to IV fluids, has improved. Will be discharged to follow-up with primary doctor as needed.  Final Clinical Impressions(s) / ED Diagnoses   Final diagnoses:  Dehydration  Weakness    New Prescriptions New Prescriptions   No medications on file  I personally performed the services described in this documentation, which was scribed in my presence. The recorded information has been reviewed and is accurate.     Orpah Greek, MD 06/09/16 [phone]

## 2016-06-09 NOTE — ED Notes (Signed)
Patient transported to CT 

## 2016-06-11 DIAGNOSIS — G3184 Mild cognitive impairment, so stated: Secondary | ICD-10-CM | POA: Diagnosis not present

## 2016-06-11 DIAGNOSIS — R7303 Prediabetes: Secondary | ICD-10-CM | POA: Diagnosis not present

## 2016-06-11 DIAGNOSIS — M159 Polyosteoarthritis, unspecified: Secondary | ICD-10-CM | POA: Diagnosis not present

## 2016-06-11 DIAGNOSIS — C61 Malignant neoplasm of prostate: Secondary | ICD-10-CM | POA: Diagnosis not present

## 2016-06-11 DIAGNOSIS — K219 Gastro-esophageal reflux disease without esophagitis: Secondary | ICD-10-CM | POA: Diagnosis not present

## 2016-06-11 DIAGNOSIS — N183 Chronic kidney disease, stage 3 (moderate): Secondary | ICD-10-CM | POA: Diagnosis not present

## 2016-06-15 ENCOUNTER — Telehealth: Payer: Self-pay | Admitting: Neurology

## 2016-06-15 NOTE — Telephone Encounter (Signed)
PT is having some side effects with the new dementia drug and needs a call back/Dawn CB# [phone]

## 2016-06-15 NOTE — Telephone Encounter (Signed)
There really aren't other options (he had side effects to two other medications as well).  I would just continue to monitor for now.

## 2016-06-15 NOTE — Telephone Encounter (Signed)
Spoke to spouse. Gave instructions per Dr. Georgie Chard previous note.  Spouse verbalized understanding.

## 2016-06-15 NOTE — Telephone Encounter (Signed)
Spoke to spouse. Patient took Rivastigmine last Tuesday had dizziness, confusion, light headedness. Thought it was from over exerting himself working outside. Went to the ED. See notes. Patient tried med again yesterday had same symptoms along w/ severe indigestion and upset stomach last night. Would like alternative med due to increasing memory problem. Advised may need to come in for OV. Spouse is willing.

## 2016-06-23 DIAGNOSIS — L812 Freckles: Secondary | ICD-10-CM | POA: Diagnosis not present

## 2016-06-23 DIAGNOSIS — D1801 Hemangioma of skin and subcutaneous tissue: Secondary | ICD-10-CM | POA: Diagnosis not present

## 2016-06-23 DIAGNOSIS — C61 Malignant neoplasm of prostate: Secondary | ICD-10-CM | POA: Diagnosis not present

## 2016-06-23 DIAGNOSIS — L820 Inflamed seborrheic keratosis: Secondary | ICD-10-CM | POA: Diagnosis not present

## 2016-06-23 DIAGNOSIS — L821 Other seborrheic keratosis: Secondary | ICD-10-CM | POA: Diagnosis not present

## 2016-06-30 DIAGNOSIS — R3915 Urgency of urination: Secondary | ICD-10-CM | POA: Diagnosis not present

## 2016-06-30 DIAGNOSIS — R1030 Lower abdominal pain, unspecified: Secondary | ICD-10-CM | POA: Diagnosis not present

## 2016-06-30 DIAGNOSIS — E291 Testicular hypofunction: Secondary | ICD-10-CM | POA: Diagnosis not present

## 2016-06-30 DIAGNOSIS — N5201 Erectile dysfunction due to arterial insufficiency: Secondary | ICD-10-CM | POA: Diagnosis not present

## 2016-06-30 DIAGNOSIS — C61 Malignant neoplasm of prostate: Secondary | ICD-10-CM | POA: Diagnosis not present

## 2016-07-01 ENCOUNTER — Other Ambulatory Visit (HOSPITAL_COMMUNITY): Payer: Self-pay | Admitting: Urology

## 2016-07-01 DIAGNOSIS — C61 Malignant neoplasm of prostate: Secondary | ICD-10-CM

## 2016-07-07 DIAGNOSIS — H25013 Cortical age-related cataract, bilateral: Secondary | ICD-10-CM | POA: Diagnosis not present

## 2016-07-07 DIAGNOSIS — H01009 Unspecified blepharitis unspecified eye, unspecified eyelid: Secondary | ICD-10-CM | POA: Diagnosis not present

## 2016-07-07 DIAGNOSIS — H2513 Age-related nuclear cataract, bilateral: Secondary | ICD-10-CM | POA: Diagnosis not present

## 2016-07-20 ENCOUNTER — Encounter (HOSPITAL_COMMUNITY)
Admission: RE | Admit: 2016-07-20 | Discharge: 2016-07-20 | Disposition: A | Discharge status: Home / Self Care | Payer: Medicare HMO | Source: Ambulatory Visit | Attending: Urology | Admitting: Urology

## 2016-07-20 DIAGNOSIS — C61 Malignant neoplasm of prostate: Secondary | ICD-10-CM

## 2016-07-20 MED ORDER — AXUMIN (FLUCICLOVINE F 18) INJECTION
10.7000 | Freq: Once | INTRAVENOUS | Status: AC
  Administered 2016-07-20: 10.7 via INTRAVENOUS

## 2016-08-30 DIAGNOSIS — H01009 Unspecified blepharitis unspecified eye, unspecified eyelid: Secondary | ICD-10-CM | POA: Diagnosis not present

## 2016-08-30 DIAGNOSIS — H2511 Age-related nuclear cataract, right eye: Secondary | ICD-10-CM | POA: Diagnosis not present

## 2016-08-30 DIAGNOSIS — H25013 Cortical age-related cataract, bilateral: Secondary | ICD-10-CM | POA: Diagnosis not present

## 2016-08-30 DIAGNOSIS — H25043 Posterior subcapsular polar age-related cataract, bilateral: Secondary | ICD-10-CM | POA: Diagnosis not present

## 2016-08-30 DIAGNOSIS — H2513 Age-related nuclear cataract, bilateral: Secondary | ICD-10-CM | POA: Diagnosis not present

## 2016-09-12 ENCOUNTER — Inpatient Hospital Stay (HOSPITAL_COMMUNITY)
Admission: EM | Admit: 2016-09-12 | Discharge: 2016-09-14 | DRG: 176 | Disposition: A | Discharge status: Home / Self Care | Payer: Medicare HMO | Attending: Internal Medicine | Admitting: Internal Medicine

## 2016-09-12 ENCOUNTER — Encounter (HOSPITAL_COMMUNITY): Payer: Self-pay | Admitting: Emergency Medicine

## 2016-09-12 ENCOUNTER — Emergency Department (HOSPITAL_COMMUNITY): Payer: Medicare HMO

## 2016-09-12 DIAGNOSIS — I82442 Acute embolism and thrombosis of left tibial vein: Secondary | ICD-10-CM | POA: Diagnosis not present

## 2016-09-12 DIAGNOSIS — K219 Gastro-esophageal reflux disease without esophagitis: Secondary | ICD-10-CM | POA: Diagnosis present

## 2016-09-12 DIAGNOSIS — M1990 Unspecified osteoarthritis, unspecified site: Secondary | ICD-10-CM | POA: Diagnosis present

## 2016-09-12 DIAGNOSIS — Z818 Family history of other mental and behavioral disorders: Secondary | ICD-10-CM

## 2016-09-12 DIAGNOSIS — Y92410 Unspecified street and highway as the place of occurrence of the external cause: Secondary | ICD-10-CM

## 2016-09-12 DIAGNOSIS — H269 Unspecified cataract: Secondary | ICD-10-CM | POA: Diagnosis not present

## 2016-09-12 DIAGNOSIS — R079 Chest pain, unspecified: Secondary | ICD-10-CM | POA: Diagnosis not present

## 2016-09-12 DIAGNOSIS — Z791 Long term (current) use of non-steroidal anti-inflammatories (NSAID): Secondary | ICD-10-CM | POA: Diagnosis not present

## 2016-09-12 DIAGNOSIS — Z87891 Personal history of nicotine dependence: Secondary | ICD-10-CM

## 2016-09-12 DIAGNOSIS — I2699 Other pulmonary embolism without acute cor pulmonale: Secondary | ICD-10-CM | POA: Diagnosis present

## 2016-09-12 DIAGNOSIS — I82432 Acute embolism and thrombosis of left popliteal vein: Secondary | ICD-10-CM | POA: Diagnosis not present

## 2016-09-12 DIAGNOSIS — N400 Enlarged prostate without lower urinary tract symptoms: Secondary | ICD-10-CM | POA: Diagnosis present

## 2016-09-12 DIAGNOSIS — S3993XA Unspecified injury of pelvis, initial encounter: Secondary | ICD-10-CM | POA: Diagnosis not present

## 2016-09-12 DIAGNOSIS — Z79899 Other long term (current) drug therapy: Secondary | ICD-10-CM

## 2016-09-12 DIAGNOSIS — Z23 Encounter for immunization: Secondary | ICD-10-CM

## 2016-09-12 DIAGNOSIS — G3184 Mild cognitive impairment, so stated: Secondary | ICD-10-CM | POA: Diagnosis present

## 2016-09-12 DIAGNOSIS — S299XXA Unspecified injury of thorax, initial encounter: Secondary | ICD-10-CM | POA: Diagnosis not present

## 2016-09-12 DIAGNOSIS — S3991XA Unspecified injury of abdomen, initial encounter: Secondary | ICD-10-CM | POA: Diagnosis not present

## 2016-09-12 DIAGNOSIS — J302 Other seasonal allergic rhinitis: Secondary | ICD-10-CM | POA: Diagnosis present

## 2016-09-12 DIAGNOSIS — S0990XA Unspecified injury of head, initial encounter: Secondary | ICD-10-CM | POA: Diagnosis not present

## 2016-09-12 DIAGNOSIS — R102 Pelvic and perineal pain: Secondary | ICD-10-CM | POA: Diagnosis not present

## 2016-09-12 DIAGNOSIS — D075 Carcinoma in situ of prostate: Secondary | ICD-10-CM | POA: Diagnosis not present

## 2016-09-12 DIAGNOSIS — F0390 Unspecified dementia without behavioral disturbance: Secondary | ICD-10-CM | POA: Diagnosis not present

## 2016-09-12 DIAGNOSIS — Z7982 Long term (current) use of aspirin: Secondary | ICD-10-CM | POA: Diagnosis not present

## 2016-09-12 DIAGNOSIS — Z9079 Acquired absence of other genital organ(s): Secondary | ICD-10-CM

## 2016-09-12 DIAGNOSIS — I82492 Acute embolism and thrombosis of other specified deep vein of left lower extremity: Secondary | ICD-10-CM | POA: Diagnosis present

## 2016-09-12 DIAGNOSIS — M542 Cervicalgia: Secondary | ICD-10-CM | POA: Diagnosis not present

## 2016-09-12 DIAGNOSIS — Z86718 Personal history of other venous thrombosis and embolism: Secondary | ICD-10-CM | POA: Diagnosis not present

## 2016-09-12 DIAGNOSIS — S199XXA Unspecified injury of neck, initial encounter: Secondary | ICD-10-CM | POA: Diagnosis not present

## 2016-09-12 LAB — URINALYSIS, ROUTINE W REFLEX MICROSCOPIC
BILIRUBIN URINE: NEGATIVE
GLUCOSE, UA: NEGATIVE mg/dL
HGB URINE DIPSTICK: NEGATIVE
Ketones, ur: NEGATIVE mg/dL
Leukocytes, UA: NEGATIVE
Nitrite: NEGATIVE
Protein, ur: NEGATIVE mg/dL
SPECIFIC GRAVITY, URINE: 1.025 (ref 1.005–1.030)
pH: 7 (ref 5.0–8.0)

## 2016-09-12 LAB — COMPREHENSIVE METABOLIC PANEL
ALK PHOS: 66 U/L (ref 38–126)
ALT: 11 U/L — ABNORMAL LOW (ref 17–63)
AST: 20 U/L (ref 15–41)
Albumin: 3.7 g/dL (ref 3.5–5.0)
Anion gap: 6 (ref 5–15)
BILIRUBIN TOTAL: 0.8 mg/dL (ref 0.3–1.2)
BUN: 15 mg/dL (ref 6–20)
CO2: 25 mmol/L (ref 22–32)
Calcium: 10.1 mg/dL (ref 8.9–10.3)
Chloride: 108 mmol/L (ref 101–111)
Creatinine, Ser: 1.45 mg/dL — ABNORMAL HIGH (ref 0.61–1.24)
GFR calc Af Amer: 51 mL/min — ABNORMAL LOW (ref >60)
GFR calc non Af Amer: 44 mL/min — ABNORMAL LOW (ref >60)
GLUCOSE: 136 mg/dL — AB (ref 65–99)
POTASSIUM: 4.4 mmol/L (ref 3.5–5.1)
Sodium: 139 mmol/L (ref 135–145)
TOTAL PROTEIN: 6.7 g/dL (ref 6.5–8.1)

## 2016-09-12 LAB — CBC
HEMATOCRIT: 40.2 % (ref 39.0–52.0)
Hemoglobin: 13.8 g/dL (ref 13.0–17.0)
MCH: 31.1 pg (ref 26.0–34.0)
MCHC: 34.3 g/dL (ref 30.0–36.0)
MCV: 90.5 fL (ref 78.0–100.0)
Platelets: 152 10*3/uL (ref 150–400)
RBC: 4.44 MIL/uL (ref 4.22–5.81)
RDW: 13.3 % (ref 11.5–15.5)
WBC: 5.4 10*3/uL (ref 4.0–10.5)

## 2016-09-12 LAB — I-STAT CHEM 8, ED
BUN: 19 mg/dL (ref 6–20)
CHLORIDE: 105 mmol/L (ref 101–111)
Calcium, Ion: 1.32 mmol/L (ref 1.15–1.40)
Creatinine, Ser: 1.5 mg/dL — ABNORMAL HIGH (ref 0.61–1.24)
Glucose, Bld: 135 mg/dL — ABNORMAL HIGH (ref 65–99)
HEMATOCRIT: 40 % (ref 39.0–52.0)
Hemoglobin: 13.6 g/dL (ref 13.0–17.0)
Potassium: 4.3 mmol/L (ref 3.5–5.1)
SODIUM: 139 mmol/L (ref 135–145)
TCO2: 25 mmol/L (ref 0–100)

## 2016-09-12 LAB — PROTIME-INR
INR: 1.11
PROTHROMBIN TIME: 14.3 s (ref 11.4–15.2)

## 2016-09-12 LAB — SAMPLE TO BLOOD BANK

## 2016-09-12 LAB — ANTITHROMBIN III: AntiThromb III Func: 99 % (ref 75–120)

## 2016-09-12 LAB — I-STAT TROPONIN, ED: TROPONIN I, POC: 0 ng/mL (ref 0.00–0.08)

## 2016-09-12 LAB — FIBRINOGEN: FIBRINOGEN: 336 mg/dL (ref 210–475)

## 2016-09-12 LAB — ETHANOL: Alcohol, Ethyl (B): <5 mg/dL (ref <5)

## 2016-09-12 LAB — APTT: aPTT: 105 seconds — ABNORMAL HIGH (ref 24–36)

## 2016-09-12 LAB — I-STAT CG4 LACTIC ACID, ED: LACTIC ACID, VENOUS: 1.2 mmol/L (ref 0.5–1.9)

## 2016-09-12 MED ORDER — PNEUMOCOCCAL VAC POLYVALENT 25 MCG/0.5ML IJ INJ
0.5000 mL | INJECTION | INTRAMUSCULAR | Status: AC
  Administered 2016-09-14: 0.5 mL via INTRAMUSCULAR

## 2016-09-12 MED ORDER — HEPARIN (PORCINE) IN NACL 100-0.45 UNIT/ML-% IJ SOLN
950.0000 [IU]/h | INTRAMUSCULAR | Status: AC
  Administered 2016-09-12: 1150 [IU]/h via INTRAVENOUS
  Administered 2016-09-13: 1050 [IU]/h via INTRAVENOUS
  Filled 2016-09-12 (×3): qty 250

## 2016-09-12 MED ORDER — ONDANSETRON HCL 4 MG/2ML IJ SOLN: 4.0000 mg | Freq: Four times a day (QID) | INTRAMUSCULAR | Status: DC | PRN

## 2016-09-12 MED ORDER — PANTOPRAZOLE SODIUM 40 MG PO TBEC
40.0000 mg | DELAYED_RELEASE_TABLET | Freq: Every day | ORAL | Status: DC
  Administered 2016-09-12 – 2016-09-14 (×3): 40 mg via ORAL
  Filled 2016-09-12 (×3): qty 1

## 2016-09-12 MED ORDER — FERROUS SULFATE 325 (65 FE) MG PO TABS
325.0000 mg | ORAL_TABLET | Freq: Every day | ORAL | Status: DC
  Administered 2016-09-13 – 2016-09-14 (×2): 325 mg via ORAL
  Filled 2016-09-12 (×2): qty 1

## 2016-09-12 MED ORDER — FLUTICASONE PROPIONATE 50 MCG/ACT NA SUSP: 1.0000 | Freq: Every day | NASAL | Status: DC

## 2016-09-12 MED ORDER — HEPARIN BOLUS VIA INFUSION
3500.0000 [IU] | Freq: Once | INTRAVENOUS | Status: AC
  Administered 2016-09-12: 3500 [IU] via INTRAVENOUS
  Filled 2016-09-12: qty 3500

## 2016-09-12 MED ORDER — SODIUM CHLORIDE 0.9% FLUSH
3.0000 mL | Freq: Two times a day (BID) | INTRAVENOUS | Status: DC
  Administered 2016-09-13: 3 mL via INTRAVENOUS

## 2016-09-12 MED ORDER — ONDANSETRON HCL 4 MG PO TABS: 4.0000 mg | ORAL_TABLET | Freq: Four times a day (QID) | ORAL | Status: DC | PRN

## 2016-09-12 MED ORDER — VITAMIN D 1000 UNITS PO TABS
1000.0000 [IU] | ORAL_TABLET | Freq: Every day | ORAL | Status: DC
  Administered 2016-09-13 – 2016-09-14 (×2): 1000 [IU] via ORAL
  Filled 2016-09-12 (×2): qty 1

## 2016-09-12 MED ORDER — IOPAMIDOL (ISOVUE-300) INJECTION 61%
INTRAVENOUS | Status: AC
  Administered 2016-09-12: 75 mL
  Filled 2016-09-12: qty 75

## 2016-09-12 MED ORDER — MIRABEGRON ER 50 MG PO TB24
50.0000 mg | ORAL_TABLET | Freq: Every evening | ORAL | Status: DC
  Administered 2016-09-12 – 2016-09-14 (×3): 50 mg via ORAL
  Filled 2016-09-12 (×3): qty 1

## 2016-09-12 MED ORDER — POLYETHYLENE GLYCOL 3350 17 G PO PACK: 17.0000 g | PACK | Freq: Every day | ORAL | Status: DC | PRN

## 2016-09-12 MED ORDER — OXYCODONE HCL 5 MG PO TABS
10.0000 mg | ORAL_TABLET | Freq: Four times a day (QID) | ORAL | Status: DC | PRN
  Administered 2016-09-12 – 2016-09-13 (×4): 10 mg via ORAL
  Filled 2016-09-12 (×4): qty 2

## 2016-09-12 MED ORDER — TRAZODONE HCL 50 MG PO TABS: 25.0000 mg | ORAL_TABLET | Freq: Every evening | ORAL | Status: DC | PRN
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
## 2016-09-12 NOTE — ED Notes (Signed)
Family at bedside. Updated pt and family plan of care , awaiting admission to the hospital

## 2016-09-12 NOTE — ED Notes (Signed)
Patient transported to CT 

## 2016-09-12 NOTE — ED Notes (Signed)
Attempted report 

## 2016-09-12 NOTE — ED Notes (Signed)
Returned from ct scan 
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
## 2016-09-12 NOTE — ED Notes (Signed)
Report given.

## 2016-09-12 NOTE — Progress Notes (Signed)
Pt admitted to the unit for ED; pt alert and verbally responsive upon arrival to the unit; IV intact with heparin transfusing. Pt voided in urinal on arrival; VSS; telemetry applied and verified with CCMD; NT called to second verify; pt oriented to the unit and room; fall/safety precaution and prevention education completed with pt and family at bedside; both voices understanding and denied any questions. Bed alarm on; call light within reach; pt wanted something for pain; MD notified and new order received; pt resting comfortably in bed and will closely monitor. Will report off to oncoming RN. Delia Heady RN
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
## 2016-09-13 ENCOUNTER — Inpatient Hospital Stay (HOSPITAL_COMMUNITY): Payer: Medicare HMO

## 2016-09-13 DIAGNOSIS — I2699 Other pulmonary embolism without acute cor pulmonale: Secondary | ICD-10-CM

## 2016-09-13 DIAGNOSIS — F0390 Unspecified dementia without behavioral disturbance: Secondary | ICD-10-CM

## 2016-09-13 DIAGNOSIS — I82432 Acute embolism and thrombosis of left popliteal vein: Secondary | ICD-10-CM

## 2016-09-13 LAB — HEPARIN LEVEL (UNFRACTIONATED)
HEPARIN UNFRACTIONATED: 0.73 [IU]/mL — AB (ref 0.30–0.70)
HEPARIN UNFRACTIONATED: 0.75 [IU]/mL — AB (ref 0.30–0.70)
Heparin Unfractionated: 0.55 IU/mL (ref 0.30–0.70)

## 2016-09-13 LAB — BASIC METABOLIC PANEL
Anion gap: 8 (ref 5–15)
BUN: 14 mg/dL (ref 6–20)
CHLORIDE: 103 mmol/L (ref 101–111)
CO2: 28 mmol/L (ref 22–32)
CREATININE: 1.32 mg/dL — AB (ref 0.61–1.24)
Calcium: 10.1 mg/dL (ref 8.9–10.3)
GFR calc Af Amer: 58 mL/min — ABNORMAL LOW (ref >60)
GFR, EST NON AFRICAN AMERICAN: 50 mL/min — AB (ref >60)
GLUCOSE: 115 mg/dL — AB (ref 65–99)
POTASSIUM: 4.3 mmol/L (ref 3.5–5.1)
SODIUM: 139 mmol/L (ref 135–145)

## 2016-09-13 LAB — CBC
HEMATOCRIT: 39.1 % (ref 39.0–52.0)
HEMOGLOBIN: 13.4 g/dL (ref 13.0–17.0)
MCH: 30.9 pg (ref 26.0–34.0)
MCHC: 34.3 g/dL (ref 30.0–36.0)
MCV: 90.1 fL (ref 78.0–100.0)
Platelets: 160 10*3/uL (ref 150–400)
RBC: 4.34 MIL/uL (ref 4.22–5.81)
RDW: 13.2 % (ref 11.5–15.5)
WBC: 6.6 10*3/uL (ref 4.0–10.5)

## 2016-09-13 LAB — PROTEIN C ACTIVITY: PROTEIN C ACTIVITY: 96 % (ref 73–180)

## 2016-09-13 LAB — PROTEIN S, TOTAL: Protein S Ag, Total: 78 % (ref 60–150)

## 2016-09-13 LAB — PROTEIN S ACTIVITY: PROTEIN S ACTIVITY: 75 % (ref 63–140)

## 2016-09-13 LAB — HOMOCYSTEINE: HOMOCYSTEINE-NORM: 9.6 umol/L (ref 0.0–15.0)

## 2016-09-13 LAB — GLUCOSE, CAPILLARY
GLUCOSE-CAPILLARY: 116 mg/dL — AB (ref 65–99)
GLUCOSE-CAPILLARY: 135 mg/dL — AB (ref 65–99)

## 2016-09-13 LAB — ECHOCARDIOGRAM COMPLETE
HEIGHTINCHES: 69 in
Weight: 2454.4 oz

## 2016-09-13 MED ORDER — SENNA 8.6 MG PO TABS
1.0000 | ORAL_TABLET | Freq: Every evening | ORAL | Status: DC | PRN
Start: 2016-09-13 — End: 2016-09-14
  Administered 2016-09-13: 8.6 mg via ORAL
  Filled 2016-09-13: qty 1

## 2016-09-13 NOTE — Progress Notes (Signed)
ANTICOAGULATION CONSULT NOTE - Follow Up Consult  Pharmacy Consult for Heparin Indication: pulmonary embolus and DVT  No Known Allergies  Patient Measurements: Height: 5\' 9"  (175.3 cm) Weight: 153 lb 6.4 oz (69.6 kg) IBW/kg (Calculated) : 70.7 Heparin Dosing Weight: 70 kg  Vital Signs: Temp: 98.6 F (37 C) (04/23 0406) Temp Source: Oral (04/23 0406) BP: 120/78 (04/23 0406) Pulse Rate: 64 (04/23 0406)  Labs:  Recent Labs  09/12/16 1131 09/12/16 1143 09/12/16 1806 09/12/16 2348 09/13/16 0942  HGB 13.8 13.6  --  13.4  --   HCT 40.2 40.0  --  39.1  --   PLT 152  --   --  160  --   APTT  --   --  105*  --   --   LABPROT 14.3  --   --   --   --   INR 1.11  --   --   --   --   HEPARINUNFRC  --   --   --  0.75* 0.55  CREATININE 1.45* 1.50*  --  1.32*  --     Estimated Creatinine Clearance: 44.7 mL/min (A) (by C-G formula based on SCr of 1.32 mg/dL (H)).  Assessment:   82 yr old male with acute PE and new left leg DVT.  Hx DVT in 2016, took Xarelto for 6 months.     Heparin level is therapeutic (0.55) on 1050 units/hr.  Initial level was just above therapeutic range on 1150 units/hr.  Goal of Therapy:  Heparin level 0.3-0.7 units/ml Monitor platelets by anticoagulation protocol: Yes   Plan:   Continue heparin drip at 1050 units/hr.  Confirmation heparin level at 6pm tonight.  Daily heparin level and CBC while on heparin.  Follow up oral anticoagulation plans.  Arty Baumgartner, [place] Pager: [phone] 09/13/2016,11:38 AM

## 2016-09-13 NOTE — Progress Notes (Signed)
*  Preliminary Results* Bilateral lower extremity venous duplex completed. The right lower extremity is negative for deep vein thrombosis. The left lower extremity is positive for mobile acute deep vein thrombosis involving the left popliteal vein. Calf vein evaluation was limited due to presence of mobile thrombus in popliteal vein, however there is evidence of probable acute deep vein thrombosis involving the left posterior tibial and peroneal veins.  There is no evidence of Baker's cyst bilaterally.  Preliminary results discussed with Dr. Eliseo Squires.  09/13/2016 8:49 AM Maudry Mayhew, BS, RVT, RDCS, RDMS
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
## 2016-09-13 NOTE — Progress Notes (Signed)
  Echocardiogram 2D Echocardiogram has been performed.  Raymond Hopkins 09/13/2016, 9:28 AM

## 2016-09-13 NOTE — Progress Notes (Signed)
ANTICOAGULATION CONSULT NOTE - Follow Up Consult  Pharmacy Consult for Heparin Indication: pulmonary embolus and DVT  No Known Allergies  Patient Measurements: Height: 5\' 9"  (175.3 cm) Weight: 153 lb 6.4 oz (69.6 kg) IBW/kg (Calculated) : 70.7 Heparin Dosing Weight: 70 kg  Vital Signs: Temp: 98.6 F (37 C) (04/23 1335) Temp Source: Oral (04/23 1335) BP: 110/66 (04/23 1335)  Labs:  Recent Labs  09/12/16 1131 09/12/16 1143 09/12/16 1806 09/12/16 2348 09/13/16 0942 09/13/16 1829  HGB 13.8 13.6  --  13.4  --   --   HCT 40.2 40.0  --  39.1  --   --   PLT 152  --   --  160  --   --   APTT  --   --  105*  --   --   --   LABPROT 14.3  --   --   --   --   --   INR 1.11  --   --   --   --   --   HEPARINUNFRC  --   --   --  0.75* 0.55 0.73*  CREATININE 1.45* 1.50*  --  1.32*  --   --     Estimated Creatinine Clearance: 44.7 mL/min (A) (by C-G formula based on SCr of 1.32 mg/dL (H)).  Assessment: 82 yr old male with acute PE and new left leg DVT.  Hx DVT in 2016, took Xarelto for 6 months. First heparin level therapeutic but confirmatory up to 0.73.  Goal of Therapy:  Heparin level 0.3-0.7 units/ml Monitor platelets by anticoagulation protocol: Yes   Plan:  Decrease heparin gtt to 950 units/hr Check heparin level in am Monitor daily heparin level, CBC, s/s of bleed  Elenor Quinones, PharmD, [facility] Clinical Pharmacist Pager [phone] 09/13/2016 7:47 PM

## 2016-09-13 NOTE — Progress Notes (Signed)
ANTICOAGULATION CONSULT NOTE - Follow-up Consult  Pharmacy Consult for heparin Indication: pulmonary embolus  No Known Allergies  Patient Measurements: Height: 5\' 9"  (175.3 cm) Weight: 154 lb 8.7 oz (70.1 kg) IBW/kg (Calculated) : 70.7 Heparin Dosing Weight: 72 kg  Vital Signs: Temp: 98.7 F (37.1 C) (04/22 2056) Temp Source: Oral (04/22 2056) BP: 130/72 (04/22 2056) Pulse Rate: 64 (04/22 2056)  Labs:  Recent Labs  09/12/16 1131 09/12/16 1143 09/12/16 1806 09/12/16 2348  HGB 13.8 13.6  --  13.4  HCT 40.2 40.0  --  39.1  PLT 152  --   --  160  APTT  --   --  105*  --   LABPROT 14.3  --   --   --   INR 1.11  --   --   --   HEPARINUNFRC  --   --   --  0.75*  CREATININE 1.45* 1.50*  --   --     Estimated Creatinine Clearance: 39.6 mL/min (A) (by C-G formula based on SCr of 1.5 mg/dL (H)).  Assessment: 64 yoM on heparin for acute R-sided PE on CT. Imaging shows no acute injury to head, chest, abdomen, or pelvis and CBC wnl. No OAC noted PTA (previously on Xarelto x 6 months s/p acute DVT).   Heparin level 0.75 (slightly supratherapeutic) on gtt at 1150 units/hr. No bleeding noted.  Goal of Therapy:  Heparin level 0.3-0.7 units/ml Monitor platelets by anticoagulation protocol: Yes   Plan:  Decrease heparin to 1050 units/hr Check 8-hr heparin level  Sherlon Handing, PharmD, BCPS Clinical pharmacist, pager [phone] 09/13/2016

## 2016-09-14 DIAGNOSIS — Z23 Encounter for immunization: Secondary | ICD-10-CM | POA: Diagnosis not present

## 2016-09-14 LAB — BASIC METABOLIC PANEL
ANION GAP: 9 (ref 5–15)
BUN: 15 mg/dL (ref 6–20)
CALCIUM: 10 mg/dL (ref 8.9–10.3)
CO2: 25 mmol/L (ref 22–32)
Chloride: 103 mmol/L (ref 101–111)
Creatinine, Ser: 1.38 mg/dL — ABNORMAL HIGH (ref 0.61–1.24)
GFR calc Af Amer: 55 mL/min — ABNORMAL LOW (ref >60)
GFR calc non Af Amer: 47 mL/min — ABNORMAL LOW (ref >60)
GLUCOSE: 114 mg/dL — AB (ref 65–99)
Potassium: 4.1 mmol/L (ref 3.5–5.1)
Sodium: 137 mmol/L (ref 135–145)

## 2016-09-14 LAB — LUPUS ANTICOAGULANT PANEL
DRVVT: 51 s — AB (ref 0.0–47.0)
PTT LA: 48.5 s (ref 0.0–51.9)

## 2016-09-14 LAB — CBC
HCT: 37.7 % — ABNORMAL LOW (ref 39.0–52.0)
HEMOGLOBIN: 13 g/dL (ref 13.0–17.0)
MCH: 31.2 pg (ref 26.0–34.0)
MCHC: 34.5 g/dL (ref 30.0–36.0)
MCV: 90.4 fL (ref 78.0–100.0)
Platelets: 163 10*3/uL (ref 150–400)
RBC: 4.17 MIL/uL — ABNORMAL LOW (ref 4.22–5.81)
RDW: 13.3 % (ref 11.5–15.5)
WBC: 5.9 10*3/uL (ref 4.0–10.5)

## 2016-09-14 LAB — HEPARIN LEVEL (UNFRACTIONATED): HEPARIN UNFRACTIONATED: 0.44 [IU]/mL (ref 0.30–0.70)

## 2016-09-14 LAB — DRVVT MIX: DRVVT MIX: 42.6 s (ref 0.0–47.0)

## 2016-09-14 MED ORDER — RIVAROXABAN 15 MG PO TABS
15.0000 mg | ORAL_TABLET | Freq: Two times a day (BID) | ORAL | Status: DC
  Administered 2016-09-14 (×2): 15 mg via ORAL
  Filled 2016-09-14 (×2): qty 1

## 2016-09-14 MED ORDER — POLYETHYLENE GLYCOL 3350 17 G PO PACK
17.0000 g | PACK | Freq: Every day | ORAL | 0 refills | Status: AC | PRN
Start: 2016-09-14 — End: ?

## 2016-09-14 MED ORDER — RIVAROXABAN (XARELTO) VTE STARTER PACK (15 & 20 MG): ORAL_TABLET | ORAL | 0 refills | Status: DC

## 2016-09-14 MED FILL — XARELTO STARTER PACK: 15 & 20 | 26 days supply | Qty: 51 | Fill #0

## 2016-09-14 NOTE — Care Management Note (Signed)
Case Management Note Marvetta Gibbons RN, BSN Unit 2W-Case Manager [phone]  Patient Details  Name: CUTBERTO WINFREE MRN: [id number] Date of Birth: Nov 05, 1934  Subjective/Objective:    Pt admitted with PE/DVT                Action/Plan: PTA pt lived at home with wife- plan to return home- has been started on Xarelto- spoke with wife at bedside- per conversation pt has been on Xarelto in past- copay cost was $47 then- has already used 30 day free card- script is for starter pack- pt's pharmacy does not have it in stock - gave pt's wife instructions where to go to get starter pack- either [facility] [address] or [facility]. Benefit check in progress to verify copay.   Expected Discharge Date:    09/14/16              Expected Discharge Plan:  Home/Self Care  In-House Referral:     Discharge planning Services  CM Consult, Medication Assistance  Post Acute Care Choice:  NA Choice offered to:  NA  DME Arranged:    DME Agency:     HH Arranged:    HH Agency:     Status of Service:  Completed, signed off  If discussed at [employer] of Stay Meetings, dates discussed:    Additional Comments:  Dawayne Patricia, RN 09/14/2016, 2:27 PM

## 2016-09-14 NOTE — Progress Notes (Signed)
Raymond Hopkins to be D/C'd Home per MD order. Discussed with the patient and all questions fully answered.    VVS, Skin clean, dry and intact without evidence of skin break down, no evidence of skin tears noted.  IV catheter discontinued intact. Site without signs and symptoms of complications. Dressing and pressure applied.  An After Visit Summary was printed and given to the patient.  Patient escorted via [place], and D/C home via private auto.  [name]  09/14/2016 4:47 PM
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
## 2016-09-14 NOTE — Progress Notes (Signed)
SATURATION QUALIFICATIONS: (This note is used to comply with regulatory documentation for home oxygen)  Patient Saturations on Room Air at Rest = 96%  Patient Saturations on Room Air while Ambulating = 96%   

## 2016-09-14 NOTE — Progress Notes (Signed)
ANTICOAGULATION CONSULT NOTE - Follow Up Consult  Pharmacy Consult for heparin Indication: PE/DVT  Labs:  Recent Labs  09/12/16 1131 09/12/16 1143 09/12/16 1806  09/12/16 2348 09/13/16 0942 09/13/16 1829 09/14/16 0432  HGB 13.8 13.6  --   --  13.4  --   --  13.0  HCT 40.2 40.0  --   --  39.1  --   --  37.7*  PLT 152  --   --   --  160  --   --  163  APTT  --   --  105*  --   --   --   --   --   LABPROT 14.3  --   --   --   --   --   --   --   INR 1.11  --   --   --   --   --   --   --   HEPARINUNFRC  --   --   --   < > 0.75* 0.55 0.73* 0.44  CREATININE 1.45* 1.50*  --   --  1.32*  --   --   --   < > = values in this interval not displayed.   Assessment/Plan:  82yo male therapeutic on heparin after rate change. Will continue gtt at current rate and confirm stable with additional level.   Wynona Neat, PharmD, BCPS  09/14/2016,5:41 AM

## 2016-09-14 NOTE — Discharge Instructions (Signed)
Information on my medicine - XARELTO (rivaroxaban)  This medication education was reviewed with me or my healthcare representative as part of my discharge preparation.  The pharmacist that spoke with me during my hospital stay was:  Arty Baumgartner, [place]? Xarelto was prescribed to treat blood clots that may have been found in the veins of your legs (deep vein thrombosis) or in your lungs (pulmonary embolism) and to reduce the risk of them occurring again.  What do you need to know about Xarelto? The starting dose is one 15 mg tablet taken TWICE daily with food for the FIRST 21 DAYS then on (enter date)  10/05/16  the dose is changed to one 20 mg tablet taken ONCE A DAY with your evening meal.  DO NOT stop taking Xarelto without talking to the health care provider who prescribed the medication.  Refill your prescription for 20 mg tablets before you run out.  After discharge, you should have regular check-up appointments with your healthcare provider that is prescribing your Xarelto.  In the future your dose may need to be changed if your kidney function changes by a significant amount.  What do you do if you miss a dose? If you are taking Xarelto TWICE DAILY and you miss a dose, take it as soon as you remember. You may take two 15 mg tablets (total 30 mg) at the same time then resume your regularly scheduled 15 mg twice daily the next day.  If you are taking Xarelto ONCE DAILY and you miss a dose, take it as soon as you remember on the same day then continue your regularly scheduled once daily regimen the next day. Do not take two doses of Xarelto at the same time.   Important Safety Information Xarelto is a blood thinner medicine that can cause bleeding. You should call your healthcare provider right away if you experience any of the following: ? Bleeding from an injury or your nose that does not stop. ? Unusual colored urine (red or dark brown)  or unusual colored stools (red or black). ? Unusual bruising for unknown reasons. ? A serious fall or if you hit your head (even if there is no bleeding).  Some medicines may interact with Xarelto and might increase your risk of bleeding while on Xarelto. To help avoid this, consult your healthcare provider or pharmacist prior to using any new prescription or non-prescription medications, including herbals, vitamins, non-steroidal anti-inflammatory drugs (NSAIDs) and supplements.  This website has more information on Xarelto: [email or web address]
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
## 2016-09-15 LAB — BETA-2-GLYCOPROTEIN I ABS, IGG/M/A
Beta-2-Glycoprotein I IgA: <9 GPI IgA units (ref 0–25)
Beta-2-Glycoprotein I IgM: <9 GPI IgM units (ref 0–32)

## 2016-09-15 LAB — CARDIOLIPIN ANTIBODIES, IGG, IGM, IGA
Anticardiolipin IgA: <9 APL U/mL (ref 0–11)
Anticardiolipin IgG: <9 GPL U/mL (ref 0–14)
Anticardiolipin IgM: <9 MPL U/mL (ref 0–12)

## 2016-09-15 LAB — PROTEIN C, TOTAL: PROTEIN C, TOTAL: 77 % (ref 60–150)

## 2016-09-16 LAB — FACTOR 5 LEIDEN

## 2016-09-17 LAB — PROTHROMBIN GENE MUTATION

## 2016-09-24 DIAGNOSIS — I2699 Other pulmonary embolism without acute cor pulmonale: Secondary | ICD-10-CM | POA: Diagnosis not present

## 2016-11-03 ENCOUNTER — Ambulatory Visit (INDEPENDENT_AMBULATORY_CARE_PROVIDER_SITE_OTHER): Payer: Medicare HMO | Admitting: Neurology

## 2016-11-03 ENCOUNTER — Encounter: Payer: Self-pay | Admitting: Neurology

## 2016-11-03 VITALS — BP 126/70 | HR 80 | Ht 69.0 in | Wt 159.0 lb

## 2016-11-03 DIAGNOSIS — F0280 Dementia in other diseases classified elsewhere without behavioral disturbance: Secondary | ICD-10-CM

## 2016-11-03 DIAGNOSIS — G301 Alzheimer's disease with late onset: Secondary | ICD-10-CM | POA: Diagnosis not present

## 2016-11-03 MED ORDER — MEMANTINE HCL ER 7 & 14 & 21 &28 MG PO CP24: ORAL_CAPSULE | ORAL | 0 refills | Status: DC
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
## 2016-11-03 NOTE — Patient Instructions (Addendum)
1.  We will start the Namenda XR titration pack.  Take as directed.  Contact me for refills . 2.  RESOURCES: Development worker, community of [facility]: [phone]  Tel [place]:  [phone]  [email or web address]-[facility].org/resources.cfm   Resources for common questions found under "Pathways & Protocols "  www.[facility]ior-resources[facility].org/pathways/Pathways_Menu.htm   For assistance with senior care, elder law, and estate planning (POA, medical directives):  [employer]  [address], [place]  [address]  Tel: [phone]  [email or web address]   Berneice Heinrich  Tel: [phone]  [email or web address]   3.  No driving until you undergo a formal occupational driving evaluation. If you are interested in the driving assessment, you can contact [employer] in [place]. [phone].  4.  Keep firearm locked away at home.  Do not carry on you.  5.  Follow up in 9 months.

## 2016-12-28 ENCOUNTER — Other Ambulatory Visit: Payer: Self-pay | Admitting: Internal Medicine

## 2016-12-28 DIAGNOSIS — R7303 Prediabetes: Secondary | ICD-10-CM | POA: Diagnosis not present

## 2016-12-28 DIAGNOSIS — Z8546 Personal history of malignant neoplasm of prostate: Secondary | ICD-10-CM | POA: Diagnosis not present

## 2016-12-28 DIAGNOSIS — N644 Mastodynia: Secondary | ICD-10-CM | POA: Diagnosis not present

## 2016-12-28 DIAGNOSIS — F0390 Unspecified dementia without behavioral disturbance: Secondary | ICD-10-CM | POA: Diagnosis not present

## 2016-12-28 DIAGNOSIS — E7800 Pure hypercholesterolemia, unspecified: Secondary | ICD-10-CM | POA: Diagnosis not present

## 2016-12-28 DIAGNOSIS — N183 Chronic kidney disease, stage 3 (moderate): Secondary | ICD-10-CM | POA: Diagnosis not present

## 2016-12-31 ENCOUNTER — Ambulatory Visit
Admission: RE | Admit: 2016-12-31 | Discharge: 2016-12-31 | Disposition: A | Discharge status: Home / Self Care | Payer: Medicare HMO | Source: Ambulatory Visit | Attending: Internal Medicine | Admitting: Internal Medicine

## 2016-12-31 ENCOUNTER — Ambulatory Visit: Payer: Medicare HMO

## 2016-12-31 DIAGNOSIS — N644 Mastodynia: Secondary | ICD-10-CM

## 2016-12-31 DIAGNOSIS — R928 Other abnormal and inconclusive findings on diagnostic imaging of breast: Secondary | ICD-10-CM | POA: Diagnosis not present

## 2017-01-01 ENCOUNTER — Encounter: Payer: Self-pay | Admitting: Neurology

## 2017-01-03 ENCOUNTER — Other Ambulatory Visit: Payer: Self-pay

## 2017-01-03 ENCOUNTER — Telehealth: Payer: Self-pay

## 2017-01-03 ENCOUNTER — Other Ambulatory Visit: Payer: Medicare HMO

## 2017-01-03 MED ORDER — MEMANTINE HCL 10 MG PO TABS: 10.0000 mg | ORAL_TABLET | Freq: Two times a day (BID) | ORAL | 2 refills | Status: DC

## 2017-01-03 NOTE — Telephone Encounter (Signed)
Called and spoke w/wife. Advsd will call in memantine 10mg  to be taken 1 tab twice daily. Wife indicated after her call, Pt started feeling better, but will still like him to try the lower twice daily dose. Will send to [facility] per her rqst.

## 2017-01-20 DIAGNOSIS — C61 Malignant neoplasm of prostate: Secondary | ICD-10-CM | POA: Diagnosis not present

## 2017-01-27 DIAGNOSIS — C61 Malignant neoplasm of prostate: Secondary | ICD-10-CM | POA: Diagnosis not present

## 2017-01-27 DIAGNOSIS — N401 Enlarged prostate with lower urinary tract symptoms: Secondary | ICD-10-CM | POA: Diagnosis not present

## 2017-01-27 DIAGNOSIS — R3915 Urgency of urination: Secondary | ICD-10-CM | POA: Diagnosis not present

## 2017-01-31 DIAGNOSIS — N183 Chronic kidney disease, stage 3 (moderate): Secondary | ICD-10-CM | POA: Diagnosis not present

## 2017-02-02 DIAGNOSIS — H25041 Posterior subcapsular polar age-related cataract, right eye: Secondary | ICD-10-CM | POA: Diagnosis not present

## 2017-02-02 DIAGNOSIS — H25043 Posterior subcapsular polar age-related cataract, bilateral: Secondary | ICD-10-CM | POA: Diagnosis not present

## 2017-02-02 DIAGNOSIS — H2513 Age-related nuclear cataract, bilateral: Secondary | ICD-10-CM | POA: Diagnosis not present

## 2017-02-02 DIAGNOSIS — H25011 Cortical age-related cataract, right eye: Secondary | ICD-10-CM | POA: Diagnosis not present

## 2017-02-02 DIAGNOSIS — H01009 Unspecified blepharitis unspecified eye, unspecified eyelid: Secondary | ICD-10-CM | POA: Diagnosis not present

## 2017-02-02 DIAGNOSIS — H25013 Cortical age-related cataract, bilateral: Secondary | ICD-10-CM | POA: Diagnosis not present

## 2017-02-02 DIAGNOSIS — H2511 Age-related nuclear cataract, right eye: Secondary | ICD-10-CM | POA: Diagnosis not present

## 2017-03-03 DIAGNOSIS — Z23 Encounter for immunization: Secondary | ICD-10-CM | POA: Diagnosis not present

## 2017-03-29 DIAGNOSIS — H2511 Age-related nuclear cataract, right eye: Secondary | ICD-10-CM | POA: Diagnosis not present

## 2017-04-02 ENCOUNTER — Other Ambulatory Visit: Payer: Self-pay | Admitting: Neurology

## 2017-04-05 DIAGNOSIS — H2512 Age-related nuclear cataract, left eye: Secondary | ICD-10-CM | POA: Diagnosis not present

## 2017-04-05 DIAGNOSIS — H25812 Combined forms of age-related cataract, left eye: Secondary | ICD-10-CM | POA: Diagnosis not present

## 2017-05-03 DIAGNOSIS — I2699 Other pulmonary embolism without acute cor pulmonale: Secondary | ICD-10-CM | POA: Diagnosis not present

## 2017-05-03 DIAGNOSIS — K219 Gastro-esophageal reflux disease without esophagitis: Secondary | ICD-10-CM | POA: Diagnosis not present

## 2017-05-03 DIAGNOSIS — N183 Chronic kidney disease, stage 3 (moderate): Secondary | ICD-10-CM | POA: Diagnosis not present

## 2017-05-03 DIAGNOSIS — F0390 Unspecified dementia without behavioral disturbance: Secondary | ICD-10-CM | POA: Diagnosis not present

## 2017-05-03 DIAGNOSIS — M1990 Unspecified osteoarthritis, unspecified site: Secondary | ICD-10-CM | POA: Diagnosis not present

## 2017-05-03 DIAGNOSIS — C61 Malignant neoplasm of prostate: Secondary | ICD-10-CM | POA: Diagnosis not present

## 2017-05-18 ENCOUNTER — Encounter (HOSPITAL_COMMUNITY): Payer: Self-pay | Admitting: Emergency Medicine

## 2017-05-18 ENCOUNTER — Emergency Department (HOSPITAL_COMMUNITY): Payer: Medicare HMO

## 2017-05-18 ENCOUNTER — Emergency Department (HOSPITAL_COMMUNITY)
Admission: EM | Admit: 2017-05-18 | Discharge: 2017-05-18 | Disposition: A | Discharge status: Home / Self Care | Payer: Medicare HMO | Attending: Emergency Medicine | Admitting: Emergency Medicine

## 2017-05-18 DIAGNOSIS — Z87891 Personal history of nicotine dependence: Secondary | ICD-10-CM | POA: Insufficient documentation

## 2017-05-18 DIAGNOSIS — Z79899 Other long term (current) drug therapy: Secondary | ICD-10-CM | POA: Insufficient documentation

## 2017-05-18 DIAGNOSIS — R0789 Other chest pain: Secondary | ICD-10-CM | POA: Insufficient documentation

## 2017-05-18 DIAGNOSIS — F0390 Unspecified dementia without behavioral disturbance: Secondary | ICD-10-CM | POA: Diagnosis not present

## 2017-05-18 DIAGNOSIS — R079 Chest pain, unspecified: Secondary | ICD-10-CM | POA: Diagnosis not present

## 2017-05-18 DIAGNOSIS — Z7901 Long term (current) use of anticoagulants: Secondary | ICD-10-CM | POA: Insufficient documentation

## 2017-05-18 DIAGNOSIS — I2699 Other pulmonary embolism without acute cor pulmonale: Secondary | ICD-10-CM | POA: Insufficient documentation

## 2017-05-18 LAB — CBC
HCT: 39.9 % (ref 39.0–52.0)
Hemoglobin: 13.4 g/dL (ref 13.0–17.0)
MCH: 30.5 pg (ref 26.0–34.0)
MCHC: 33.6 g/dL (ref 30.0–36.0)
MCV: 90.9 fL (ref 78.0–100.0)
PLATELETS: 160 10*3/uL (ref 150–400)
RBC: 4.39 MIL/uL (ref 4.22–5.81)
RDW: 13.1 % (ref 11.5–15.5)
WBC: 5.1 10*3/uL (ref 4.0–10.5)

## 2017-05-18 LAB — BASIC METABOLIC PANEL
Anion gap: 7 (ref 5–15)
BUN: 16 mg/dL (ref 6–20)
CALCIUM: 10 mg/dL (ref 8.9–10.3)
CO2: 24 mmol/L (ref 22–32)
CREATININE: 1.45 mg/dL — AB (ref 0.61–1.24)
Chloride: 105 mmol/L (ref 101–111)
GFR, EST AFRICAN AMERICAN: 51 mL/min — AB (ref >60)
GFR, EST NON AFRICAN AMERICAN: 44 mL/min — AB (ref >60)
Glucose, Bld: 106 mg/dL — ABNORMAL HIGH (ref 65–99)
Potassium: 3.9 mmol/L (ref 3.5–5.1)
SODIUM: 136 mmol/L (ref 135–145)

## 2017-05-18 LAB — I-STAT TROPONIN, ED
TROPONIN I, POC: 0 ng/mL (ref 0.00–0.08)
TROPONIN I, POC: 0 ng/mL (ref 0.00–0.08)

## 2017-05-18 MED ORDER — IOPAMIDOL (ISOVUE-370) INJECTION 76%
INTRAVENOUS | Status: AC
  Administered 2017-05-18: 100 mL
  Filled 2017-05-18: qty 100

## 2017-05-18 MED ORDER — SODIUM CHLORIDE 0.9 % IV BOLUS (SEPSIS)
1000.0000 mL | Freq: Once | INTRAVENOUS | Status: AC
  Administered 2017-05-18: 1000 mL via INTRAVENOUS

## 2017-05-18 MED ORDER — MORPHINE SULFATE (PF) 4 MG/ML IV SOLN
4.0000 mg | Freq: Once | INTRAVENOUS | Status: AC
Start: 2017-05-18 — End: 2017-05-18
  Administered 2017-05-18: 4 mg via INTRAVENOUS
  Filled 2017-05-18: qty 1

## 2017-05-18 NOTE — ED Notes (Signed)
Pt getting dressed.

## 2017-05-18 NOTE — ED Triage Notes (Signed)
Patient reports sharp burning pain on both sides of chest.  Worse on right than left.  Hx of PE.  Pain started during the night and reports it was getting worse.

## 2017-05-18 NOTE — ED Notes (Signed)
ED Provider at bedside. 

## 2017-05-18 NOTE — ED Notes (Signed)
Wife at bedside; patient resting comfortably; reminded to keep arm straight so fluid can run in. Pain 2/10.
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
## 2017-05-18 NOTE — ED Notes (Signed)
Patient transported to CT 

## 2017-05-25 IMAGING — PT NM PET NOPR SKULL BASE TO THIGH
7 series · 25 of 25 positions shown · non-contrast
Comparison: None.

CLINICAL DATA: Prostate carcinoma with biochemical recurrence.
Prostate cancer with prostate. 10/13/2015. PSA 12.4 on 06/23/2016.

EXAM:
NUCLEAR MEDICINE PET SKULL BASE TO THIGH
TECHNIQUE: 10.7 mCi F-18 Fluciclovine was injected intravenously. Full-ring PET
imaging was performed from the skull base to thigh after the
radiotracer. CT data was obtained and used for attenuation
correction and anatomic localization.

[Series 3: pet sk_thigh ac · axial · 5.0mm · 4.07mm/px · z∈[+458,+1302]mm · 6 of 212 slices shown]
[im 1/212]
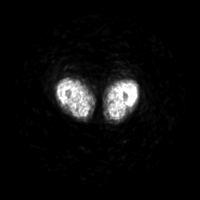
[im 43/212]
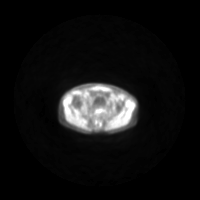
[im 85/212]
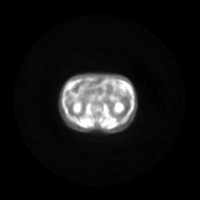
[im 127/212]
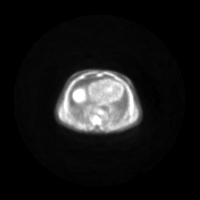
[im 169/212]
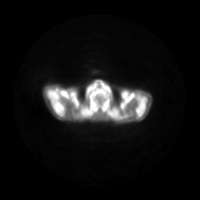
[im 212/212]
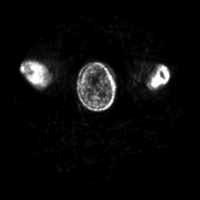

[Series 4: ct sk_thigh 5.0 b31f · axial · 5.0mm · 0.98mm/px · z∈[+458,+1302]mm · 5 of 212 slices shown]
[im 1/212]
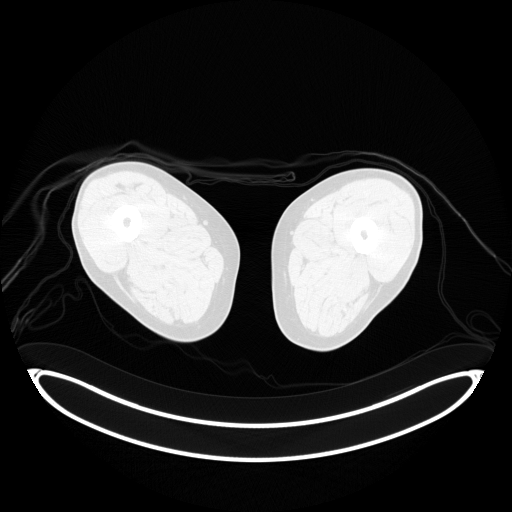
[im 53/212]
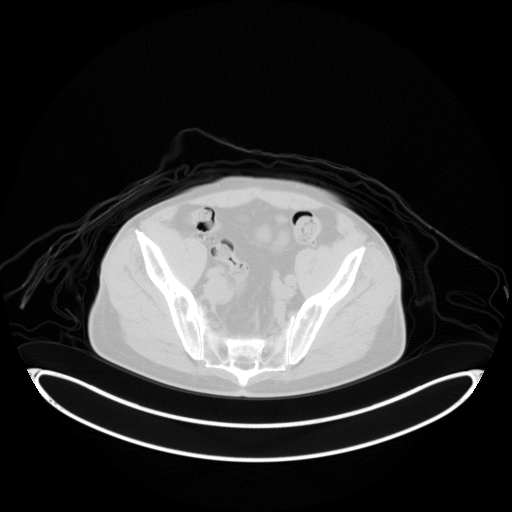
[im 106/212]
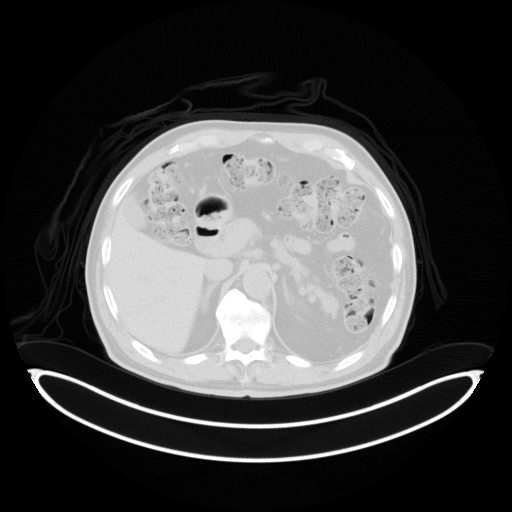
[im 159/212]
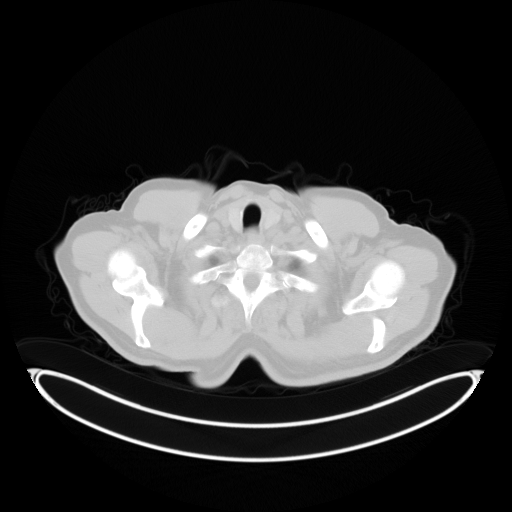
[im 212/212  brain]
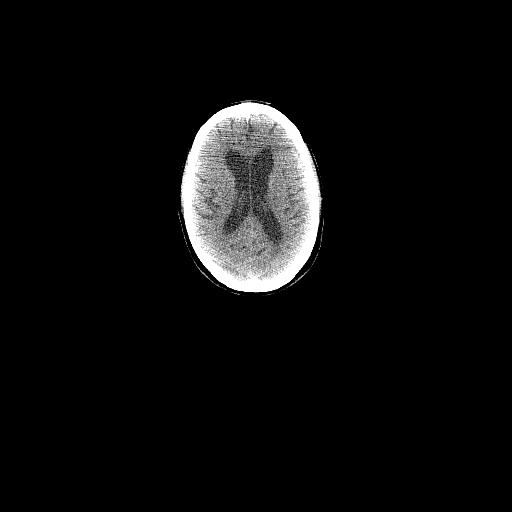

[Series 7: pet sk_thigh nac · axial · 5.0mm · 4.07mm/px · z∈[+458,+1302]mm · 5 of 212 slices shown]
[im 1/212]
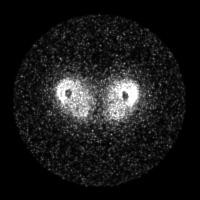
[im 53/212]
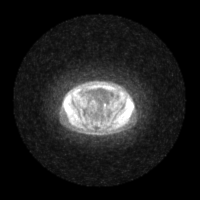
[im 106/212]
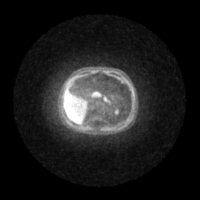
[im 159/212]
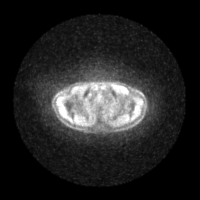
[im 212/212]
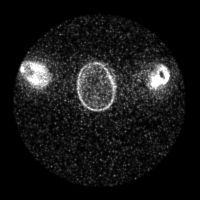

[Series 8: ct sk_thigh 5.0 b70f lung_bone · axial · 5.0mm · 0.71mm/px · z∈[+872,+1132]mm · 2 of 66 slices shown]
[im 1/66  bone]
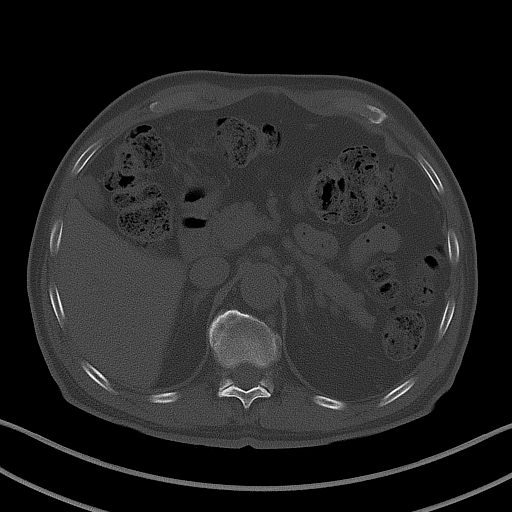
[im 66/66  bone]
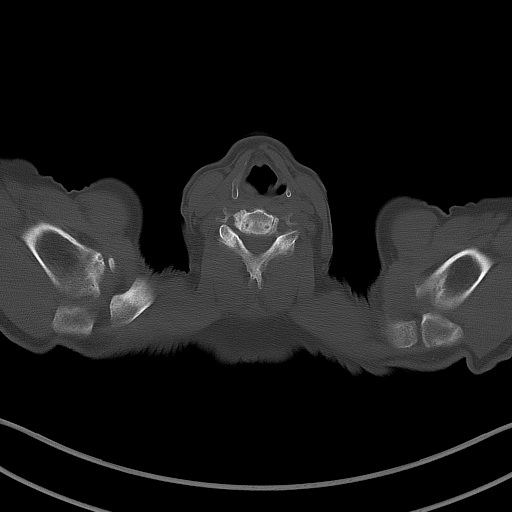

[Series 604: mip · coronal · 1.75mm/px · 1 of 32 slices shown]
[im 1/32]
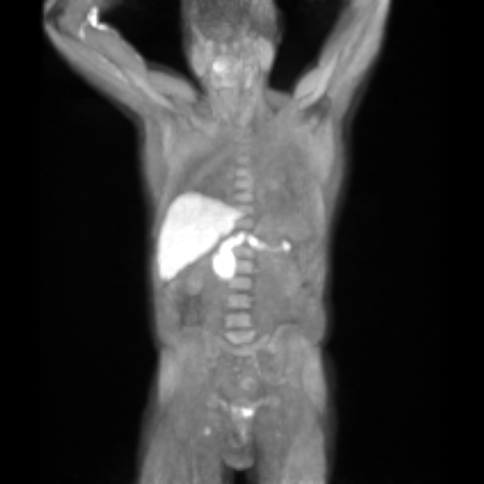

[Series 605: range-ct sk_thigh 5.0 (id)<alpha range> · 1 of 38 slices shown (1 of 2)]
[im 1/38]
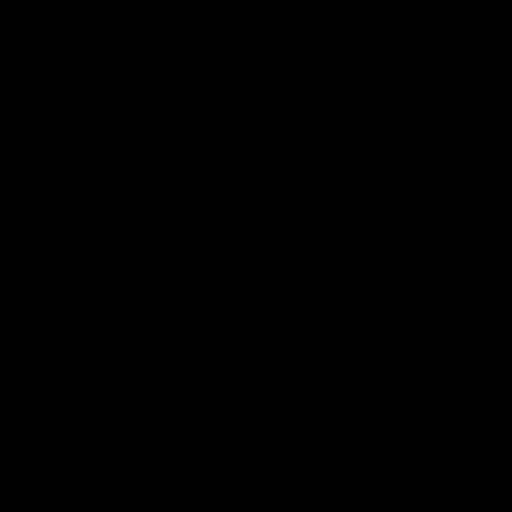

[Series 606: range-ct sk_thigh 5.0 (id)<alpha range> · 5 of 202 slices shown (2 of 2)]
[im 1/202]
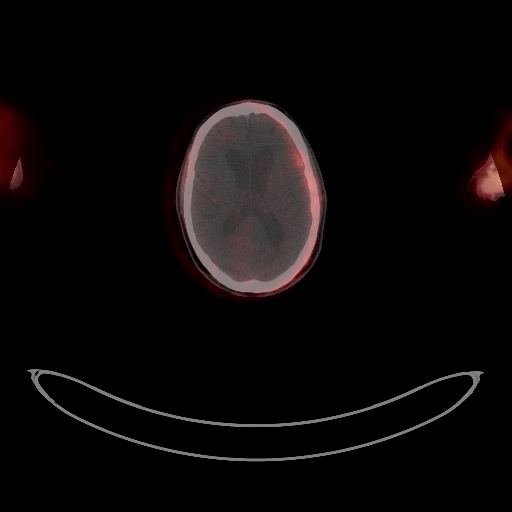
[im 51/202]
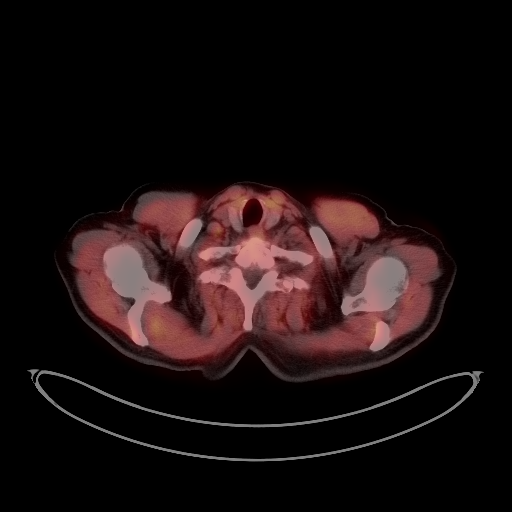
[im 101/202]
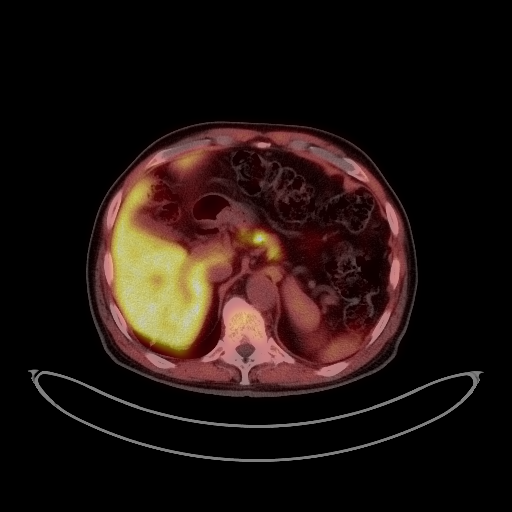
[im 151/202]
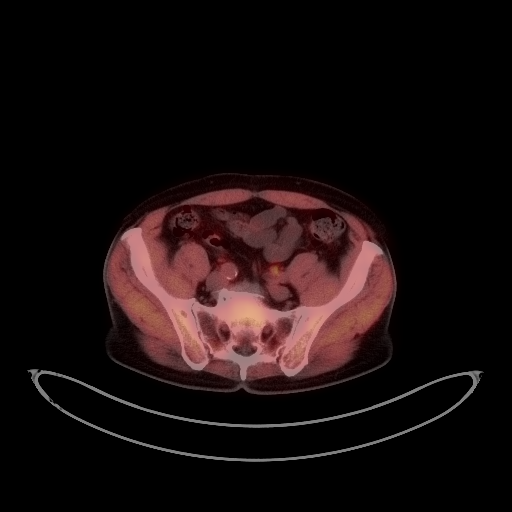
[im 202/202]
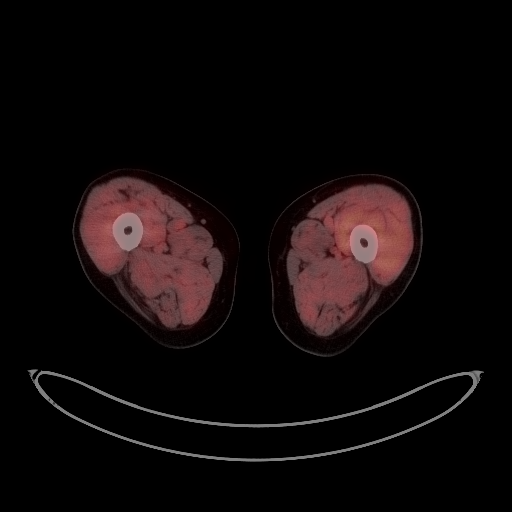

[25 of 25 positions shown; findings below may reference images not displayed]

FINDINGS: NECK

No radiotracer activity in neck lymph nodes.

CHEST

No radiotracer accumulation within mediastinal or hilar lymph nodes.
No suspicious pulmonary nodules on the CT scan.

ABDOMEN/PELVIS

No abnormal radiotracer activity within the liver, pancreas, adrenal
glands, or spleen. Physiologic activity noted in the liver and
pancreas.

No radiotracer accumulation and nodes in the abdomen or pelvis.

Asymmetric activity within the LEFT mid gland and apex with SUV max
equal

SKELETON

No focal  activity to suggest skeletal metastasis.
IMPRESSION: 1. No evidence of nodal metastasis in the pelvis. No evidence
distant metastatic disease.
2. Asymmetric increase activity at the LEFT apex may represent
prostate carcinoma.
3. No evidence skeletal metastasis.

## 2017-06-22 DIAGNOSIS — L821 Other seborrheic keratosis: Secondary | ICD-10-CM | POA: Diagnosis not present

## 2017-06-22 DIAGNOSIS — L814 Other melanin hyperpigmentation: Secondary | ICD-10-CM | POA: Diagnosis not present

## 2017-06-22 DIAGNOSIS — D225 Melanocytic nevi of trunk: Secondary | ICD-10-CM | POA: Diagnosis not present

## 2017-06-22 DIAGNOSIS — L853 Xerosis cutis: Secondary | ICD-10-CM | POA: Diagnosis not present

## 2017-07-04 ENCOUNTER — Other Ambulatory Visit: Payer: Self-pay | Admitting: Neurology

## 2017-07-04 ENCOUNTER — Telehealth: Payer: Self-pay | Admitting: Neurology

## 2017-07-04 NOTE — Telephone Encounter (Signed)
He is scheduled for follow up in a month.  A sooner appointment isn't necessarily needed then unless his wife would like to be seen sooner.  For acute change, we need to check for underlying UTI.  I would check UA with culture.  Otherwise, it is likely natural progression of the disease.

## 2017-07-04 NOTE — Telephone Encounter (Signed)
Called and spoke with Pts wife. She is very concerned symptoms have progressed quickly in the last week. Examples: Raymond Hopkins 06/26/17-Wife dropped Pt off at church like always, Pt unable to remember that evening what he had done that day. Asked 3x in row had he taken meds. Mon 2/4-couldn't remember children's names Tues 2/5-Pt upset, he was unable to remember how he met his wife Raymond Hopkins & Thu 2/6-2/7-late at night rummaging through drawers, when asked what he was looking for he didn't know, but said he was unable to go to sleep until he found it. Fri-2/8-asked wife to fix an egg sandwich for breakfast, which he always has with mayo, this time he also wanted peanut butter and lettuce, insisted that's the way he's always had his egg sandwich. He then proceeded to eat it. Has an appointment here 08/03/17, she wanted to know if you feel he should be seen sooner.

## 2017-07-04 NOTE — Telephone Encounter (Signed)
Called and spoke with wife, she will contact PCP about UA w/culture

## 2017-07-04 NOTE — Telephone Encounter (Signed)
Patient wife called and needs to talk to someone about patient behavior change  Patient has appt in March please call

## 2017-07-05 ENCOUNTER — Encounter: Payer: Self-pay | Admitting: Neurology

## 2017-07-05 DIAGNOSIS — N183 Chronic kidney disease, stage 3 (moderate): Secondary | ICD-10-CM | POA: Diagnosis not present

## 2017-07-05 DIAGNOSIS — R410 Disorientation, unspecified: Secondary | ICD-10-CM | POA: Diagnosis not present

## 2017-07-05 DIAGNOSIS — F0390 Unspecified dementia without behavioral disturbance: Secondary | ICD-10-CM | POA: Diagnosis not present

## 2017-07-12 ENCOUNTER — Ambulatory Visit: Payer: Medicare HMO | Admitting: Sports Medicine

## 2017-07-12 ENCOUNTER — Encounter: Payer: Self-pay | Admitting: Sports Medicine

## 2017-07-12 VITALS — BP 150/77 | HR 61 | Resp 16

## 2017-07-12 DIAGNOSIS — Z7901 Long term (current) use of anticoagulants: Secondary | ICD-10-CM | POA: Diagnosis not present

## 2017-07-12 DIAGNOSIS — M79675 Pain in left toe(s): Secondary | ICD-10-CM

## 2017-07-12 DIAGNOSIS — D689 Coagulation defect, unspecified: Secondary | ICD-10-CM

## 2017-07-12 DIAGNOSIS — B351 Tinea unguium: Secondary | ICD-10-CM

## 2017-07-12 DIAGNOSIS — M79674 Pain in right toe(s): Secondary | ICD-10-CM | POA: Diagnosis not present
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
## 2017-07-12 NOTE — Progress Notes (Signed)
   Subjective:    Patient ID: Raymond Hopkins, male    DOB: May 25, 1934, 83 y.o.   MRN: [id number]  HPI    Review of Systems  All other systems reviewed and are negative.      Objective:   Physical Exam        Assessment & Plan:

## 2017-07-26 DIAGNOSIS — C61 Malignant neoplasm of prostate: Secondary | ICD-10-CM | POA: Diagnosis not present

## 2017-08-01 DIAGNOSIS — C61 Malignant neoplasm of prostate: Secondary | ICD-10-CM | POA: Diagnosis not present

## 2017-08-01 DIAGNOSIS — N5201 Erectile dysfunction due to arterial insufficiency: Secondary | ICD-10-CM | POA: Diagnosis not present

## 2017-08-01 DIAGNOSIS — R350 Frequency of micturition: Secondary | ICD-10-CM | POA: Diagnosis not present

## 2017-08-03 ENCOUNTER — Encounter: Payer: Self-pay | Admitting: Neurology

## 2017-08-03 ENCOUNTER — Ambulatory Visit: Payer: Medicare HMO | Admitting: Neurology

## 2017-08-03 VITALS — BP 116/60 | HR 74 | Resp 16 | Ht 68.0 in | Wt 170.8 lb

## 2017-08-03 DIAGNOSIS — G301 Alzheimer's disease with late onset: Secondary | ICD-10-CM

## 2017-08-03 DIAGNOSIS — F0280 Dementia in other diseases classified elsewhere without behavioral disturbance: Secondary | ICD-10-CM | POA: Diagnosis not present
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
## 2017-08-03 NOTE — Patient Instructions (Signed)
Continue Namenda Please review the information regarding resources for Alzheimer's Agree with 24 hour supervision Follow up in 9 months.

## 2017-08-31 DIAGNOSIS — E7800 Pure hypercholesterolemia, unspecified: Secondary | ICD-10-CM | POA: Diagnosis not present

## 2017-08-31 DIAGNOSIS — F0390 Unspecified dementia without behavioral disturbance: Secondary | ICD-10-CM | POA: Diagnosis not present

## 2017-08-31 DIAGNOSIS — R7303 Prediabetes: Secondary | ICD-10-CM | POA: Diagnosis not present

## 2017-08-31 DIAGNOSIS — C61 Malignant neoplasm of prostate: Secondary | ICD-10-CM | POA: Diagnosis not present

## 2017-08-31 DIAGNOSIS — K219 Gastro-esophageal reflux disease without esophagitis: Secondary | ICD-10-CM | POA: Diagnosis not present

## 2017-08-31 DIAGNOSIS — M48061 Spinal stenosis, lumbar region without neurogenic claudication: Secondary | ICD-10-CM | POA: Diagnosis not present

## 2017-08-31 DIAGNOSIS — N183 Chronic kidney disease, stage 3 (moderate): Secondary | ICD-10-CM | POA: Diagnosis not present

## 2017-09-12 ENCOUNTER — Encounter: Payer: Self-pay | Admitting: Physical Therapy

## 2017-09-12 ENCOUNTER — Other Ambulatory Visit: Payer: Self-pay

## 2017-09-12 ENCOUNTER — Ambulatory Visit: Payer: Medicare HMO | Attending: Internal Medicine | Admitting: Physical Therapy

## 2017-09-12 DIAGNOSIS — M6281 Muscle weakness (generalized): Secondary | ICD-10-CM | POA: Insufficient documentation

## 2017-09-12 DIAGNOSIS — R293 Abnormal posture: Secondary | ICD-10-CM | POA: Insufficient documentation

## 2017-09-12 DIAGNOSIS — M545 Low back pain, unspecified: Secondary | ICD-10-CM
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
## 2017-09-14 ENCOUNTER — Ambulatory Visit: Payer: Medicare HMO | Admitting: Physical Therapy

## 2017-09-14 ENCOUNTER — Encounter: Payer: Self-pay | Admitting: Physical Therapy

## 2017-09-14 DIAGNOSIS — M545 Low back pain, unspecified: Secondary | ICD-10-CM

## 2017-09-14 DIAGNOSIS — M6281 Muscle weakness (generalized): Secondary | ICD-10-CM | POA: Diagnosis not present

## 2017-09-14 DIAGNOSIS — R293 Abnormal posture: Secondary | ICD-10-CM
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
## 2017-09-16 ENCOUNTER — Encounter: Payer: Self-pay | Admitting: Neurology

## 2017-09-19 ENCOUNTER — Encounter: Payer: Self-pay | Admitting: Physical Therapy

## 2017-09-19 ENCOUNTER — Ambulatory Visit: Payer: Medicare HMO | Admitting: Physical Therapy

## 2017-09-19 DIAGNOSIS — M545 Low back pain, unspecified: Secondary | ICD-10-CM

## 2017-09-19 DIAGNOSIS — M6281 Muscle weakness (generalized): Secondary | ICD-10-CM

## 2017-09-19 DIAGNOSIS — R293 Abnormal posture: Secondary | ICD-10-CM | POA: Diagnosis not present
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
## 2017-09-21 ENCOUNTER — Encounter: Payer: Self-pay | Admitting: Physical Therapy

## 2017-09-21 ENCOUNTER — Ambulatory Visit: Payer: Medicare HMO | Attending: Internal Medicine | Admitting: Physical Therapy

## 2017-09-21 DIAGNOSIS — M6281 Muscle weakness (generalized): Secondary | ICD-10-CM | POA: Insufficient documentation

## 2017-09-21 DIAGNOSIS — R296 Repeated falls: Secondary | ICD-10-CM | POA: Diagnosis not present

## 2017-09-21 DIAGNOSIS — M25561 Pain in right knee: Secondary | ICD-10-CM | POA: Insufficient documentation

## 2017-09-21 DIAGNOSIS — M545 Low back pain, unspecified: Secondary | ICD-10-CM

## 2017-09-21 DIAGNOSIS — G8929 Other chronic pain: Secondary | ICD-10-CM | POA: Diagnosis not present

## 2017-09-21 DIAGNOSIS — R293 Abnormal posture: Secondary | ICD-10-CM | POA: Diagnosis not present

## 2017-09-21 DIAGNOSIS — M25562 Pain in left knee: Secondary | ICD-10-CM | POA: Diagnosis not present

## 2017-09-22 ENCOUNTER — Encounter: Payer: Self-pay | Admitting: Physical Therapy
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
## 2017-09-26 ENCOUNTER — Ambulatory Visit: Payer: Medicare HMO | Admitting: Physical Therapy

## 2017-09-26 ENCOUNTER — Encounter: Payer: Self-pay | Admitting: Physical Therapy

## 2017-09-26 DIAGNOSIS — M6281 Muscle weakness (generalized): Secondary | ICD-10-CM

## 2017-09-26 DIAGNOSIS — R293 Abnormal posture: Secondary | ICD-10-CM

## 2017-09-26 DIAGNOSIS — M545 Low back pain, unspecified: Secondary | ICD-10-CM

## 2017-09-26 DIAGNOSIS — M25562 Pain in left knee: Secondary | ICD-10-CM | POA: Diagnosis not present

## 2017-09-26 DIAGNOSIS — G8929 Other chronic pain: Secondary | ICD-10-CM | POA: Diagnosis not present

## 2017-09-26 DIAGNOSIS — R296 Repeated falls: Secondary | ICD-10-CM | POA: Diagnosis not present

## 2017-09-26 DIAGNOSIS — M25561 Pain in right knee: Secondary | ICD-10-CM | POA: Diagnosis not present
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
## 2017-09-28 ENCOUNTER — Encounter: Payer: Self-pay | Admitting: Physical Therapy

## 2017-09-28 ENCOUNTER — Ambulatory Visit: Payer: Medicare HMO | Admitting: Physical Therapy

## 2017-09-28 DIAGNOSIS — R293 Abnormal posture: Secondary | ICD-10-CM

## 2017-09-28 DIAGNOSIS — M25561 Pain in right knee: Secondary | ICD-10-CM | POA: Diagnosis not present

## 2017-09-28 DIAGNOSIS — M25562 Pain in left knee: Secondary | ICD-10-CM | POA: Diagnosis not present

## 2017-09-28 DIAGNOSIS — M6281 Muscle weakness (generalized): Secondary | ICD-10-CM

## 2017-09-28 DIAGNOSIS — M545 Low back pain, unspecified: Secondary | ICD-10-CM

## 2017-09-28 DIAGNOSIS — R296 Repeated falls: Secondary | ICD-10-CM | POA: Diagnosis not present

## 2017-09-28 DIAGNOSIS — G8929 Other chronic pain: Secondary | ICD-10-CM | POA: Diagnosis not present
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
## 2017-10-03 ENCOUNTER — Other Ambulatory Visit: Payer: Self-pay | Admitting: Neurology

## 2017-10-03 ENCOUNTER — Ambulatory Visit: Payer: Medicare HMO | Admitting: Physical Therapy

## 2017-10-05 ENCOUNTER — Ambulatory Visit: Payer: Medicare HMO | Admitting: Physical Therapy

## 2017-10-05 ENCOUNTER — Encounter: Payer: Self-pay | Admitting: Physical Therapy

## 2017-10-05 DIAGNOSIS — Z0100 Encounter for examination of eyes and vision without abnormal findings: Secondary | ICD-10-CM | POA: Diagnosis not present

## 2017-10-05 DIAGNOSIS — M25561 Pain in right knee: Secondary | ICD-10-CM

## 2017-10-05 DIAGNOSIS — R293 Abnormal posture: Secondary | ICD-10-CM | POA: Diagnosis not present

## 2017-10-05 DIAGNOSIS — M6281 Muscle weakness (generalized): Secondary | ICD-10-CM

## 2017-10-05 DIAGNOSIS — M545 Low back pain, unspecified: Secondary | ICD-10-CM

## 2017-10-05 DIAGNOSIS — G8929 Other chronic pain: Secondary | ICD-10-CM

## 2017-10-05 DIAGNOSIS — M25562 Pain in left knee: Secondary | ICD-10-CM

## 2017-10-05 DIAGNOSIS — R296 Repeated falls: Secondary | ICD-10-CM | POA: Diagnosis not present
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
## 2017-10-10 ENCOUNTER — Ambulatory Visit: Payer: Medicare HMO | Admitting: Physical Therapy

## 2017-10-10 DIAGNOSIS — M6281 Muscle weakness (generalized): Secondary | ICD-10-CM

## 2017-10-10 DIAGNOSIS — M25561 Pain in right knee: Secondary | ICD-10-CM | POA: Diagnosis not present

## 2017-10-10 DIAGNOSIS — R293 Abnormal posture: Secondary | ICD-10-CM

## 2017-10-10 DIAGNOSIS — R296 Repeated falls: Secondary | ICD-10-CM | POA: Diagnosis not present

## 2017-10-10 DIAGNOSIS — M545 Low back pain, unspecified: Secondary | ICD-10-CM

## 2017-10-10 DIAGNOSIS — G8929 Other chronic pain: Secondary | ICD-10-CM | POA: Diagnosis not present

## 2017-10-10 DIAGNOSIS — M25562 Pain in left knee: Secondary | ICD-10-CM | POA: Diagnosis not present
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
## 2017-10-11 ENCOUNTER — Encounter: Payer: Self-pay | Admitting: Sports Medicine

## 2017-10-11 ENCOUNTER — Ambulatory Visit: Payer: Medicare HMO | Admitting: Sports Medicine

## 2017-10-11 DIAGNOSIS — M79675 Pain in left toe(s): Secondary | ICD-10-CM | POA: Diagnosis not present

## 2017-10-11 DIAGNOSIS — D689 Coagulation defect, unspecified: Secondary | ICD-10-CM

## 2017-10-11 DIAGNOSIS — B351 Tinea unguium: Secondary | ICD-10-CM

## 2017-10-11 DIAGNOSIS — M79674 Pain in right toe(s): Secondary | ICD-10-CM

## 2017-10-11 DIAGNOSIS — Z7901 Long term (current) use of anticoagulants: Secondary | ICD-10-CM
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
## 2017-10-19 ENCOUNTER — Ambulatory Visit: Payer: Medicare HMO | Admitting: Physical Therapy

## 2017-10-19 ENCOUNTER — Encounter: Payer: Self-pay | Admitting: Physical Therapy

## 2017-10-19 DIAGNOSIS — M6281 Muscle weakness (generalized): Secondary | ICD-10-CM

## 2017-10-19 DIAGNOSIS — R293 Abnormal posture: Secondary | ICD-10-CM

## 2017-10-19 DIAGNOSIS — M25562 Pain in left knee: Secondary | ICD-10-CM | POA: Diagnosis not present

## 2017-10-19 DIAGNOSIS — G8929 Other chronic pain: Secondary | ICD-10-CM | POA: Diagnosis not present

## 2017-10-19 DIAGNOSIS — M545 Low back pain, unspecified: Secondary | ICD-10-CM

## 2017-10-19 DIAGNOSIS — M25561 Pain in right knee: Secondary | ICD-10-CM | POA: Diagnosis not present

## 2017-10-19 DIAGNOSIS — R296 Repeated falls: Secondary | ICD-10-CM | POA: Diagnosis not present

## 2017-10-20 ENCOUNTER — Encounter: Payer: Self-pay | Admitting: Physical Therapy
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
## 2017-10-26 ENCOUNTER — Ambulatory Visit: Payer: Medicare HMO | Attending: Internal Medicine | Admitting: Physical Therapy

## 2017-10-26 DIAGNOSIS — R293 Abnormal posture: Secondary | ICD-10-CM | POA: Diagnosis not present

## 2017-10-26 DIAGNOSIS — M545 Low back pain, unspecified: Secondary | ICD-10-CM

## 2017-10-26 DIAGNOSIS — R296 Repeated falls: Secondary | ICD-10-CM | POA: Insufficient documentation

## 2017-10-26 DIAGNOSIS — M25561 Pain in right knee: Secondary | ICD-10-CM | POA: Diagnosis not present

## 2017-10-26 DIAGNOSIS — G8929 Other chronic pain: Secondary | ICD-10-CM | POA: Insufficient documentation

## 2017-10-26 DIAGNOSIS — M6281 Muscle weakness (generalized): Secondary | ICD-10-CM

## 2017-10-26 DIAGNOSIS — M25562 Pain in left knee: Secondary | ICD-10-CM | POA: Diagnosis not present
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
## 2017-11-02 ENCOUNTER — Ambulatory Visit: Payer: Medicare HMO | Admitting: Physical Therapy

## 2017-11-02 DIAGNOSIS — M545 Low back pain, unspecified: Secondary | ICD-10-CM

## 2017-11-02 DIAGNOSIS — M25562 Pain in left knee: Secondary | ICD-10-CM | POA: Diagnosis not present

## 2017-11-02 DIAGNOSIS — M6281 Muscle weakness (generalized): Secondary | ICD-10-CM | POA: Diagnosis not present

## 2017-11-02 DIAGNOSIS — M25561 Pain in right knee: Secondary | ICD-10-CM | POA: Diagnosis not present

## 2017-11-02 DIAGNOSIS — R293 Abnormal posture: Secondary | ICD-10-CM

## 2017-11-02 DIAGNOSIS — G8929 Other chronic pain: Secondary | ICD-10-CM | POA: Diagnosis not present

## 2017-11-02 DIAGNOSIS — R296 Repeated falls: Secondary | ICD-10-CM | POA: Diagnosis not present

## 2017-11-03 ENCOUNTER — Encounter: Payer: Self-pay | Admitting: Physical Therapy
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
## 2017-11-09 ENCOUNTER — Ambulatory Visit: Payer: Medicare HMO | Admitting: Physical Therapy

## 2017-11-09 DIAGNOSIS — M25561 Pain in right knee: Secondary | ICD-10-CM

## 2017-11-09 DIAGNOSIS — M6281 Muscle weakness (generalized): Secondary | ICD-10-CM

## 2017-11-09 DIAGNOSIS — R296 Repeated falls: Secondary | ICD-10-CM | POA: Diagnosis not present

## 2017-11-09 DIAGNOSIS — G8929 Other chronic pain: Secondary | ICD-10-CM | POA: Diagnosis not present

## 2017-11-09 DIAGNOSIS — R293 Abnormal posture: Secondary | ICD-10-CM

## 2017-11-09 DIAGNOSIS — M545 Low back pain, unspecified: Secondary | ICD-10-CM

## 2017-11-09 DIAGNOSIS — M25562 Pain in left knee: Secondary | ICD-10-CM

## 2017-11-10 ENCOUNTER — Encounter: Payer: Self-pay | Admitting: Physical Therapy
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
## 2017-12-27 DIAGNOSIS — N183 Chronic kidney disease, stage 3 (moderate): Secondary | ICD-10-CM | POA: Diagnosis not present

## 2017-12-27 DIAGNOSIS — Z1389 Encounter for screening for other disorder: Secondary | ICD-10-CM | POA: Diagnosis not present

## 2017-12-27 DIAGNOSIS — E7800 Pure hypercholesterolemia, unspecified: Secondary | ICD-10-CM | POA: Diagnosis not present

## 2017-12-27 DIAGNOSIS — R7303 Prediabetes: Secondary | ICD-10-CM | POA: Diagnosis not present

## 2017-12-27 DIAGNOSIS — K219 Gastro-esophageal reflux disease without esophagitis: Secondary | ICD-10-CM | POA: Diagnosis not present

## 2017-12-27 DIAGNOSIS — Z0000 Encounter for general adult medical examination without abnormal findings: Secondary | ICD-10-CM | POA: Diagnosis not present

## 2017-12-27 DIAGNOSIS — M1990 Unspecified osteoarthritis, unspecified site: Secondary | ICD-10-CM | POA: Diagnosis not present

## 2017-12-27 DIAGNOSIS — F0390 Unspecified dementia without behavioral disturbance: Secondary | ICD-10-CM | POA: Diagnosis not present

## 2017-12-27 DIAGNOSIS — Z23 Encounter for immunization: Secondary | ICD-10-CM | POA: Diagnosis not present

## 2017-12-27 DIAGNOSIS — C61 Malignant neoplasm of prostate: Secondary | ICD-10-CM | POA: Diagnosis not present

## 2018-01-10 ENCOUNTER — Encounter: Payer: Self-pay | Admitting: Sports Medicine

## 2018-01-10 ENCOUNTER — Ambulatory Visit: Payer: Medicare HMO | Admitting: Sports Medicine

## 2018-01-10 DIAGNOSIS — M79674 Pain in right toe(s): Secondary | ICD-10-CM | POA: Diagnosis not present

## 2018-01-10 DIAGNOSIS — B351 Tinea unguium: Secondary | ICD-10-CM | POA: Diagnosis not present

## 2018-01-10 DIAGNOSIS — Z7901 Long term (current) use of anticoagulants: Secondary | ICD-10-CM | POA: Diagnosis not present

## 2018-01-10 DIAGNOSIS — D689 Coagulation defect, unspecified: Secondary | ICD-10-CM | POA: Diagnosis not present

## 2018-01-10 DIAGNOSIS — M79675 Pain in left toe(s): Secondary | ICD-10-CM
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
## 2018-02-01 DIAGNOSIS — C61 Malignant neoplasm of prostate: Secondary | ICD-10-CM | POA: Diagnosis not present

## 2018-02-08 DIAGNOSIS — N5201 Erectile dysfunction due to arterial insufficiency: Secondary | ICD-10-CM | POA: Diagnosis not present

## 2018-02-08 DIAGNOSIS — R350 Frequency of micturition: Secondary | ICD-10-CM | POA: Diagnosis not present

## 2018-02-08 DIAGNOSIS — C61 Malignant neoplasm of prostate: Secondary | ICD-10-CM | POA: Diagnosis not present

## 2018-02-20 MED FILL — SHINGRIX 50 MCG SUS: 50 | 1 days supply | Qty: 1 | Fill #0

## 2018-02-28 DIAGNOSIS — Z23 Encounter for immunization: Secondary | ICD-10-CM | POA: Diagnosis not present

## 2018-04-10 ENCOUNTER — Other Ambulatory Visit: Payer: Self-pay | Admitting: Neurology

## 2018-04-11 ENCOUNTER — Ambulatory Visit: Payer: Medicare HMO | Admitting: Podiatry

## 2018-04-11 DIAGNOSIS — B351 Tinea unguium: Secondary | ICD-10-CM

## 2018-04-11 DIAGNOSIS — M79674 Pain in right toe(s): Secondary | ICD-10-CM | POA: Diagnosis not present

## 2018-04-11 DIAGNOSIS — M79675 Pain in left toe(s): Secondary | ICD-10-CM | POA: Diagnosis not present

## 2018-04-11 MED ORDER — KETOCONAZOLE 2 % EX CREA: 1.0000 "application " | TOPICAL_CREAM | Freq: Every day | CUTANEOUS | 1 refills | Status: DC

## 2018-04-11 NOTE — Patient Instructions (Addendum)
Onychomycosis/Fungal Toenails  WHAT IS IT? An infection that lies within the keratin of your nail plate that is caused by a fungus.  WHY ME? Fungal infections affect all ages, sexes, races, and creeds.  There may be many factors that predispose you to a fungal infection such as age, coexisting medical conditions such as diabetes, or an autoimmune disease; stress, medications, fatigue, genetics, etc.  Bottom line: fungus thrives in a warm, moist environment and your shoes offer such a location.  IS IT CONTAGIOUS? Theoretically, yes.  You do not want to share shoes, nail clippers or files with someone who has fungal toenails.  Walking around barefoot in the same room or sleeping in the same bed is unlikely to transfer the organism.  It is important to realize, however, that fungus can spread easily from one nail to the next on the same foot.  HOW DO WE TREAT THIS?  There are several ways to treat this condition.  Treatment may depend on many factors such as age, medications, pregnancy, liver and kidney conditions, etc.  It is best to ask your doctor which options are available to you.  1. No treatment.   Unlike many other medical concerns, you can live with this condition.  However for many people this can be a painful condition and may lead to ingrown toenails or a bacterial infection.  It is recommended that you keep the nails cut short to help reduce the amount of fungal nail. 2. Topical treatment.  These range from herbal remedies to prescription strength nail lacquers.  About 40-50% effective, topicals require twice daily application for approximately 9 to 12 months or until an entirely new nail has grown out.  The most effective topicals are medical grade medications available through physicians offices. 3. Oral antifungal medications.  With an 80-90% cure rate, the most common oral medication requires 3 to 4 months of therapy and stays in your system for a year as the new nail grows out.  Oral  antifungal medications do require blood work to make sure it is a safe drug for you.  A liver function panel will be performed prior to starting the medication and after the first month of treatment.  It is important to have the blood work performed to avoid any harmful side effects.  In general, this medication safe but blood work is required. 4. Laser Therapy.  This treatment is performed by applying a specialized laser to the affected nail plate.  This therapy is noninvasive, fast, and non-painful.  It is not covered by insurance and is therefore, out of pocket.  The results have been very good with a 80-95% cure rate.  The [facility] is the only practice in the area to offer this therapy. 5. Permanent Nail Avulsion.  Removing the entire nail so that a new nail will not grow back.  Athlete's Foot Athlete's foot (tinea pedis) is a fungal infection of the skin on the feet. It often occurs on the skin that is between or underneath the toes. It can also occur on the soles of the feet. The infection can spread from person to person (is contagious). Follow these instructions at home:  Apply or take over-the-counter and prescription medicines only as told by your doctor.  Keep all follow-up visits as told by your doctor. This is important.  Do not scratch your feet.  Keep your feet dry: ? Wear cotton or wool socks. Change your socks every day or if they become wet. ?  Wear shoes that allow air to move around, such as sandals or canvas tennis shoes.  Wash and dry your feet: ? Every day or as told by your doctor. ? After exercising. ? Including the area between your toes.  Wear sandals in wet areas, such as locker rooms and shared showers.  Do not share any of these items: ? Towels. ? Nail clippers. ? Other personal items that touch your feet.  If you have diabetes, keep your blood sugar under control. Contact a doctor if:  You have a fever.  You have swelling, soreness, warmth, or  redness in your foot.  You are not getting better with treatment.  Your symptoms get worse.  You have new symptoms. This information is not intended to replace advice given to you by your health care provider. Make sure you discuss any questions you have with your health care provider. Document Released: 10/27/2007 Document Revised: 10/16/2015 Document Reviewed: 11/11/2014 Elsevier Interactive Patient Education  2018 [employer].
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
## 2018-04-25 ENCOUNTER — Ambulatory Visit: Payer: Medicare HMO | Admitting: Neurology

## 2018-04-25 ENCOUNTER — Other Ambulatory Visit: Payer: Self-pay

## 2018-04-25 ENCOUNTER — Encounter: Payer: Self-pay | Admitting: Neurology

## 2018-04-25 VITALS — BP 154/80 | HR 94 | Ht 68.0 in | Wt 174.0 lb

## 2018-04-25 DIAGNOSIS — G301 Alzheimer's disease with late onset: Secondary | ICD-10-CM

## 2018-04-25 DIAGNOSIS — F0280 Dementia in other diseases classified elsewhere without behavioral disturbance: Secondary | ICD-10-CM | POA: Diagnosis not present

## 2018-04-25 NOTE — Patient Instructions (Signed)
1.  Continue memantine 10mg  twice daily 2.  Continue 24 hour supervision.  Continue going to the club 3.  If you have increased anxiety, contact me and we can start a medication such as citalopram or escitalopram. 4.  Contact me with increased frequency of confusion 5.  Follow up in 6 months.

## 2018-05-01 ENCOUNTER — Encounter: Payer: Self-pay | Admitting: Podiatry

## 2018-05-01 NOTE — Progress Notes (Signed)
Subjective: KORIN HARTWELL presents today accompanied by his wife for management with painful, thick toenails 1-5 b/l that he cannot cut and which interfere with daily activities.    No Known Allergies  Objective:  Vascular Examination: Capillary refill time immediate x 10 digits Dorsalis pedis and Posterior tibial pulses palpable 2/4 b/l Digital hair present x 10 digits Skin temperature gradient WNL b/l  Dermatological Examination: Skin with normal turgor, texture and tone b/l  Toenails 1-5 b/l discolored, thick, dystrophic with subungual debris and pain with palpation to nailbeds due to thickness of nails.  Musculoskeletal: Muscle strength 5/5 to all LE muscle groups  Neurological: Sensation intact with 10 gram monofilament. Vibratory sensation intact.  Assessment: Painful onychomycosis toenails 1-5 b/l   Plan: 1. Toenails 1-5 b/l were debrided in length and girth without iatrogenic bleeding. 2. Patient to continue soft, supportive shoe gear 3. Patient to report any pedal injuries to medical professional immediately. 4. Follow up 3 months. Patient/POA to call should there be a concern in the interim.

## 2018-05-05 ENCOUNTER — Ambulatory Visit: Payer: Medicare HMO | Admitting: Neurology

## 2018-05-05 MED FILL — SHINGRIX 50 MCG SUS: 50 | 1 days supply | Qty: 1 | Fill #1

## 2018-06-19 DIAGNOSIS — L570 Actinic keratosis: Secondary | ICD-10-CM | POA: Diagnosis not present

## 2018-06-19 DIAGNOSIS — B079 Viral wart, unspecified: Secondary | ICD-10-CM | POA: Diagnosis not present

## 2018-06-19 DIAGNOSIS — D225 Melanocytic nevi of trunk: Secondary | ICD-10-CM | POA: Diagnosis not present

## 2018-06-19 DIAGNOSIS — D485 Neoplasm of uncertain behavior of skin: Secondary | ICD-10-CM | POA: Diagnosis not present

## 2018-07-04 DIAGNOSIS — I2699 Other pulmonary embolism without acute cor pulmonale: Secondary | ICD-10-CM | POA: Diagnosis not present

## 2018-07-04 DIAGNOSIS — K219 Gastro-esophageal reflux disease without esophagitis: Secondary | ICD-10-CM | POA: Diagnosis not present

## 2018-07-04 DIAGNOSIS — F0390 Unspecified dementia without behavioral disturbance: Secondary | ICD-10-CM | POA: Diagnosis not present

## 2018-07-04 DIAGNOSIS — R7303 Prediabetes: Secondary | ICD-10-CM | POA: Diagnosis not present

## 2018-07-04 DIAGNOSIS — M1990 Unspecified osteoarthritis, unspecified site: Secondary | ICD-10-CM | POA: Diagnosis not present

## 2018-07-04 DIAGNOSIS — N183 Chronic kidney disease, stage 3 (moderate): Secondary | ICD-10-CM | POA: Diagnosis not present

## 2018-07-04 DIAGNOSIS — Z8546 Personal history of malignant neoplasm of prostate: Secondary | ICD-10-CM | POA: Diagnosis not present

## 2018-07-11 ENCOUNTER — Ambulatory Visit: Payer: Medicare HMO | Admitting: Podiatry

## 2018-07-11 ENCOUNTER — Encounter: Payer: Self-pay | Admitting: Podiatry

## 2018-07-11 ENCOUNTER — Other Ambulatory Visit: Payer: Self-pay

## 2018-07-11 DIAGNOSIS — B351 Tinea unguium: Secondary | ICD-10-CM

## 2018-07-11 DIAGNOSIS — B353 Tinea pedis: Secondary | ICD-10-CM

## 2018-07-11 DIAGNOSIS — M79674 Pain in right toe(s): Secondary | ICD-10-CM

## 2018-07-11 DIAGNOSIS — M79675 Pain in left toe(s): Secondary | ICD-10-CM

## 2018-07-11 NOTE — Patient Instructions (Signed)

## 2018-07-20 MED ORDER — KETOCONAZOLE 2 % EX CREA: 1.0000 "application " | TOPICAL_CREAM | Freq: Every day | CUTANEOUS | 1 refills | Status: AC

## 2018-07-20 NOTE — Progress Notes (Signed)
Subjective: Raymond Hopkins presents today with painful, thick toenails 1-5 b/l that he cannot cut and which interfere with daily activities.  Pain is aggravated when wearing enclosed shoe gear.  He states he is finished his antifungal cream.  However, his feet are still itching somewhat.  He is also taking Xarelto.  Wenda Low, MD is his PCP.   Current Outpatient Medications:  .  acetaminophen (TYLENOL) 500 MG tablet, Take 500 mg by mouth every morning. MAY TAKE A SECOND DOSE OF 500 MG AT BEDTIME IF STILL IN PAIN, Disp: , Rfl:  .  cholecalciferol (VITAMIN D) 1000 UNITS tablet, Take 1,000 Units by mouth daily. , Disp: , Rfl:  .  diclofenac sodium (VOLTAREN) 1 % GEL, Apply 2 g topically 4 (four) times daily as needed (pain)., Disp: , Rfl:  .  famotidine (PEPCID) 20 MG tablet, , Disp: , Rfl: 3 .  ketoconazole (NIZORAL) 2 % cream, Apply 1 application topically daily. Apply to both feet and between toes once daily for 6 weeks, Disp: 30 g, Rfl: 1 .  memantine (NAMENDA) 10 MG tablet, TAKE ONE (1) TABLET BY MOUTH TWO (2) TIMES DAILY, Disp: 60 tablet, Rfl: 5 .  mirabegron ER (MYRBETRIQ) 50 MG TB24 tablet, Take 50 mg by mouth every evening., Disp: , Rfl:  .  Misc Natural Products (TURMERIC CURCUMIN) CAPS, Take 1 capsule by mouth 2 (two) times daily., Disp: , Rfl:  .  pantoprazole (PROTONIX) 40 MG tablet, Take 40 mg by mouth daily., Disp: , Rfl:  .  polyethylene glycol (MIRALAX / GLYCOLAX) packet, Take 17 g by mouth daily as needed for mild constipation., Disp: 14 each, Rfl: 0 .  ranitidine (ZANTAC) 150 MG tablet, Take 150 mg by mouth 2 (two) times daily., Disp: , Rfl:  .  sildenafil (VIAGRA) 100 MG tablet, Take 100 mg by mouth daily as needed for erectile dysfunction., Disp: , Rfl:  .  traMADol (ULTRAM) 50 MG tablet, Take 50 mg by mouth every 6 (six) hours as needed., Disp: , Rfl:  .  triamcinolone cream (KENALOG) 0.1 %, , Disp: , Rfl:  .  vitamin B-12 (CYANOCOBALAMIN) 500 MCG tablet, Take 500  mcg by mouth daily. , Disp: , Rfl:  .  XARELTO 20 MG TABS tablet, Take 20 mg by mouth every evening. , Disp: , Rfl:   No Known Allergies  Objective:  Vascular Examination: Capillary refill time immediate x 10 digits  Dorsalis pedis and Posterior tibial pulses 2/4 b/l  Digital hair present x 10 digits  Skin temperature gradient WNL b/l  Dermatological Examination: Skin with normal turgor, texture and tone b/l  Toenails 1-5 b/l discolored, thick, dystrophic with subungual debris and pain with palpation to nailbeds due to thickness of nails.  Improvement noted in forefoot and plantar scaling of both feet.  However there is some residual scaling still visible.  There is no erythema, no edema, no drainage noted.  There is no break in skin noted.  No blistering, no weeping.  Musculoskeletal: Muscle strength 5/5 to all LE muscle groups  No pain, crepitus or joint limitation noted with ROM.   Neurological: Sensation intact with 10 gram monofilament. Vibratory sensation intact.  Assessment: Painful onychomycosis toenails 1-5 b/l  Tinea pedis bilaterally  Plan: 1. Toenails 1-5 b/l were debrided in length and girth without iatrogenic bleeding.  Avoid self trimming due to use of blood thinner. 2. For his tinea pedis, we will refill his ketoconazole.  He is to complete another 6-week course.  3. Patient to continue soft, supportive shoe gear. 4. Patient to report any pedal injuries to medical professional immediately. 5. Follow up 3 months.  6. Patient/POA to call should there be a concern in the interim.

## 2018-07-27 ENCOUNTER — Telehealth: Payer: Self-pay | Admitting: Neurology

## 2018-07-27 NOTE — Telephone Encounter (Signed)
Patient wife called and wants to talk to someone about patient behavioral changes and medication that Dr Tomi Likens talked about at last appt please call

## 2018-07-31 NOTE — Telephone Encounter (Signed)
Patient's wife is calling in again. Please call. Thanks!
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
## 2018-08-01 MED ORDER — QUETIAPINE FUMARATE 25 MG PO TABS: 25.0000 mg | ORAL_TABLET | Freq: Every day | ORAL | 3 refills | Status: DC

## 2018-08-01 NOTE — Telephone Encounter (Signed)
Called and advised Edythe. She will discuss with her son who is a Software engineer the [place] and black box warning.

## 2018-08-01 NOTE — Telephone Encounter (Signed)
We had discussed starting Seroquel.  We discussed potential side effects at last office visit, including the black box warning.  Initiate 25mg  at bedtime

## 2018-08-08 DIAGNOSIS — C61 Malignant neoplasm of prostate: Secondary | ICD-10-CM | POA: Diagnosis not present

## 2018-08-08 NOTE — Progress Notes (Addendum)
Rcvd fax from [facility], quetiapine 25 mg approved through 05/24/2019  PA Case: [id number], Status: Approved, Coverage Starts on: 08/02/2018 12:00:00 AM, Coverage Ends on: 05/24/2019 12:00:00 AM. Questions? Contact [phone]

## 2018-08-14 DIAGNOSIS — C61 Malignant neoplasm of prostate: Secondary | ICD-10-CM | POA: Diagnosis not present

## 2018-08-14 DIAGNOSIS — N5201 Erectile dysfunction due to arterial insufficiency: Secondary | ICD-10-CM | POA: Diagnosis not present

## 2018-08-25 MED FILL — QUETIAPINE FUMARATE 25 MG T: 25 | 30 days supply | Qty: 30 | Fill #0

## 2018-09-06 MED FILL — MYRBETRIQ ER 50 MG TABLET: 50 | 30 days supply | Qty: 30 | Fill #0

## 2018-09-11 MED FILL — MEMANTINE HCL 10 MG TABS: 10 | 30 days supply | Qty: 60 | Fill #0

## 2018-09-18 MED FILL — QUETIAPINE FUMARATE 25 MG T: 25 | 30 days supply | Qty: 30 | Fill #1

## 2018-09-18 MED FILL — FAMOTIDINE 20 MG TABS: 20 | 30 days supply | Qty: 60 | Fill #0

## 2018-09-20 DIAGNOSIS — F0281 Dementia in other diseases classified elsewhere with behavioral disturbance: Secondary | ICD-10-CM | POA: Diagnosis not present

## 2018-09-20 DIAGNOSIS — G301 Alzheimer's disease with late onset: Secondary | ICD-10-CM | POA: Diagnosis not present

## 2018-09-20 DIAGNOSIS — N183 Chronic kidney disease, stage 3 (moderate): Secondary | ICD-10-CM | POA: Diagnosis not present

## 2018-09-20 DIAGNOSIS — I2699 Other pulmonary embolism without acute cor pulmonale: Secondary | ICD-10-CM | POA: Diagnosis not present

## 2018-09-21 ENCOUNTER — Other Ambulatory Visit: Payer: Self-pay

## 2018-09-21 ENCOUNTER — Ambulatory Visit: Payer: Medicare HMO | Admitting: Podiatry

## 2018-09-21 VITALS — Temp 97.3°F

## 2018-09-21 DIAGNOSIS — M79675 Pain in left toe(s): Secondary | ICD-10-CM

## 2018-09-21 DIAGNOSIS — B351 Tinea unguium: Secondary | ICD-10-CM

## 2018-09-21 DIAGNOSIS — M79674 Pain in right toe(s): Secondary | ICD-10-CM | POA: Diagnosis not present

## 2018-09-21 DIAGNOSIS — Z7901 Long term (current) use of anticoagulants: Secondary | ICD-10-CM | POA: Diagnosis not present

## 2018-09-21 NOTE — Patient Instructions (Signed)

## 2018-09-25 DIAGNOSIS — Z111 Encounter for screening for respiratory tuberculosis: Secondary | ICD-10-CM | POA: Diagnosis not present

## 2018-09-28 ENCOUNTER — Encounter: Payer: Self-pay | Admitting: Podiatry

## 2018-09-28 NOTE — Progress Notes (Signed)
Subjective: Raymond Hopkins is a 84 y.o. y.o. male who is on long term blood thinner Xarelto,  presents today with painful, discolored, thick toenails  which interfere with daily activities. Pain is aggravated when wearing enclosed shoe gear. Pain is relieved with periodic professional debridement.  Patient's PCP is Wenda Low, MD .   Current Outpatient Medications:  .  acetaminophen (TYLENOL) 500 MG tablet, Take 500 mg by mouth every morning. MAY TAKE A SECOND DOSE OF 500 MG AT BEDTIME IF STILL IN PAIN, Disp: , Rfl:  .  cholecalciferol (VITAMIN D) 1000 UNITS tablet, Take 1,000 Units by mouth daily. , Disp: , Rfl:  .  famotidine (PEPCID) 20 MG tablet, , Disp: , Rfl: 3 .  ketoconazole (NIZORAL) 2 % cream, Apply 1 application topically daily. Apply to both feet and between toes once daily for 6 weeks, Disp: 30 g, Rfl: 1 .  memantine (NAMENDA) 10 MG tablet, TAKE ONE (1) TABLET BY MOUTH TWO (2) TIMES DAILY, Disp: 60 tablet, Rfl: 5 .  mirabegron ER (MYRBETRIQ) 50 MG TB24 tablet, Take 50 mg by mouth every evening., Disp: , Rfl:  .  Misc Natural Products (TURMERIC CURCUMIN) CAPS, Take 1 capsule by mouth 2 (two) times daily., Disp: , Rfl:  .  pantoprazole (PROTONIX) 40 MG tablet, Take 40 mg by mouth daily., Disp: , Rfl:  .  polyethylene glycol (MIRALAX / GLYCOLAX) packet, Take 17 g by mouth daily as needed for mild constipation., Disp: 14 each, Rfl: 0 .  QUEtiapine (SEROQUEL) 25 MG tablet, Take 1 tablet (25 mg total) by mouth at bedtime., Disp: 30 tablet, Rfl: 3 .  ranitidine (ZANTAC) 150 MG tablet, Take 150 mg by mouth 2 (two) times daily., Disp: , Rfl:  .  sildenafil (VIAGRA) 100 MG tablet, Take 100 mg by mouth daily as needed for erectile dysfunction., Disp: , Rfl:  .  triamcinolone cream (KENALOG) 0.1 %, , Disp: , Rfl:  .  vitamin B-12 (CYANOCOBALAMIN) 500 MCG tablet, Take 500 mcg by mouth daily. , Disp: , Rfl:  .  XARELTO 20 MG TABS tablet, Take 20 mg by mouth every evening. , Disp: , Rfl:     No Known Allergies   Objective: Vitals:   09/21/18 1407  Temp: (!) 97.3 F (36.3 C)   Vascular Examination: Capillary refill time immediate x 10 digits.  Dorsalis pedis pulses palpable b/l.  Posterior tibial pulses palpable b/l.  Digital hair present x 10 digits.  Skin temperature gradient WNL b/l.  Dermatological Examination: Skin with normal turgor, texture and tone b/l.  Toenails 1-5 b/l discolored, thick, dystrophic with subungual debris and pain with palpation to nailbeds due to thickness of nails.  Musculoskeletal: Muscle strength 5/5 to all LE muscle groups.  No pain, crepitus or joint limitation noted with ROM.  Neurological: Sensation intact with 10 gram monofilament.  Vibratory sensation intact.  Assessment: Painful onychomycosis toenails 1-5 b/l in patient on blood thinner.   Plan: 1. Toenails 1-5 b/l were debrided in length and girth without iatrogenic bleeding. 2. Patient to continue soft, supportive shoe gear daily. 3. Patient to report any pedal injuries to medical professional immediately. 4. Avoid self trimming due to use of blood thinner. 5. Follow up 3 months.  6. Patient/POA to call should there be a concern in the interim.

## 2018-10-10 ENCOUNTER — Ambulatory Visit: Payer: Medicare HMO | Admitting: Podiatry

## 2018-10-24 DIAGNOSIS — R278 Other lack of coordination: Secondary | ICD-10-CM | POA: Diagnosis not present

## 2018-10-24 DIAGNOSIS — M545 Low back pain: Secondary | ICD-10-CM | POA: Diagnosis not present

## 2018-10-24 DIAGNOSIS — M6281 Muscle weakness (generalized): Secondary | ICD-10-CM | POA: Diagnosis not present

## 2018-10-24 DIAGNOSIS — R2681 Unsteadiness on feet: Secondary | ICD-10-CM | POA: Diagnosis not present

## 2018-10-26 ENCOUNTER — Ambulatory Visit: Payer: Medicare HMO | Admitting: Neurology

## 2018-10-26 ENCOUNTER — Telehealth: Payer: Self-pay | Admitting: Neurology

## 2018-10-26 DIAGNOSIS — M545 Low back pain: Secondary | ICD-10-CM | POA: Diagnosis not present

## 2018-10-26 DIAGNOSIS — R278 Other lack of coordination: Secondary | ICD-10-CM | POA: Diagnosis not present

## 2018-10-26 DIAGNOSIS — R2681 Unsteadiness on feet: Secondary | ICD-10-CM | POA: Diagnosis not present

## 2018-10-26 DIAGNOSIS — M6281 Muscle weakness (generalized): Secondary | ICD-10-CM | POA: Diagnosis not present
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
## 2018-10-26 NOTE — Telephone Encounter (Signed)
Increase Seroquel from 25mg  at bedtime to 50mg  at bedtime.

## 2018-10-26 NOTE — Telephone Encounter (Signed)
Pt;s wife called to inform that memory care facility will contact Dr. Tomi Likens for some guidance regarding pt's medications. Mrs. Laduca would like a call back to talk about it.

## 2018-10-27 ENCOUNTER — Other Ambulatory Visit: Payer: Self-pay

## 2018-10-27 DIAGNOSIS — M545 Low back pain: Secondary | ICD-10-CM | POA: Diagnosis not present

## 2018-10-27 DIAGNOSIS — R278 Other lack of coordination: Secondary | ICD-10-CM | POA: Diagnosis not present

## 2018-10-27 DIAGNOSIS — M6281 Muscle weakness (generalized): Secondary | ICD-10-CM | POA: Diagnosis not present

## 2018-10-27 DIAGNOSIS — R2681 Unsteadiness on feet: Secondary | ICD-10-CM | POA: Diagnosis not present

## 2018-10-27 MED ORDER — QUETIAPINE FUMARATE 50 MG PO TABS: 50.0000 mg | ORAL_TABLET | Freq: Every day | ORAL | 6 refills | Status: DC

## 2018-10-27 NOTE — Telephone Encounter (Signed)
Left message at home number for wife to return call.  Also called [facility] and left message for nurse to return call. Informed that we were waiting on documentation that they were requesting a change in medication and that Dr. Tomi Likens was ok with increasing Seroquel if needed. Just need to confirm that is what is needed and the pharmacy.

## 2018-10-27 NOTE — Telephone Encounter (Signed)
Pt's caregiver returned your call, pls call her again.

## 2018-10-27 NOTE — Telephone Encounter (Signed)
Spoke with [place] at [employer] She sent paperwork to pts PCP about pts health change. She confirmed that pt continues to pace and wonder at night which is not new. However, he is getting harder to redirect. He is resisting.   Caryl Ada requests if ok with Dr. Tomi Likens a Rx for Ativan gel or an oral form to help pt.  Instructed for her to send forms to our office for documentation first and Dr. Tomi Likens can review. Fax number given.

## 2018-10-30 ENCOUNTER — Telehealth: Payer: Self-pay | Admitting: Neurology

## 2018-10-30 NOTE — Telephone Encounter (Signed)
Pls call pt, she would like to speak with you regarding some medications for her husband.

## 2018-10-31 DIAGNOSIS — M6281 Muscle weakness (generalized): Secondary | ICD-10-CM | POA: Diagnosis not present

## 2018-10-31 DIAGNOSIS — M545 Low back pain: Secondary | ICD-10-CM | POA: Diagnosis not present

## 2018-10-31 DIAGNOSIS — R278 Other lack of coordination: Secondary | ICD-10-CM | POA: Diagnosis not present

## 2018-10-31 DIAGNOSIS — R2681 Unsteadiness on feet: Secondary | ICD-10-CM | POA: Diagnosis not present

## 2018-10-31 MED ORDER — CITALOPRAM HYDROBROMIDE 10 MG PO TABS: 10.0000 mg | ORAL_TABLET | Freq: Every day | ORAL | 1 refills | Status: AC

## 2018-10-31 NOTE — Telephone Encounter (Signed)
Citalopram sent to pharmacy per Dr Tomi Likens.  Informed patients wife.

## 2018-10-31 NOTE — Telephone Encounter (Signed)
Patient is more irritated than normal and his wife was wondering if there was something he can take?  Please advise.

## 2018-11-02 DIAGNOSIS — R2681 Unsteadiness on feet: Secondary | ICD-10-CM | POA: Diagnosis not present

## 2018-11-02 DIAGNOSIS — R278 Other lack of coordination: Secondary | ICD-10-CM | POA: Diagnosis not present

## 2018-11-02 DIAGNOSIS — M545 Low back pain: Secondary | ICD-10-CM | POA: Diagnosis not present

## 2018-11-02 DIAGNOSIS — M6281 Muscle weakness (generalized): Secondary | ICD-10-CM | POA: Diagnosis not present

## 2018-11-03 DIAGNOSIS — R278 Other lack of coordination: Secondary | ICD-10-CM | POA: Diagnosis not present

## 2018-11-03 DIAGNOSIS — M6281 Muscle weakness (generalized): Secondary | ICD-10-CM | POA: Diagnosis not present

## 2018-11-03 DIAGNOSIS — R2681 Unsteadiness on feet: Secondary | ICD-10-CM | POA: Diagnosis not present

## 2018-11-03 DIAGNOSIS — M545 Low back pain: Secondary | ICD-10-CM | POA: Diagnosis not present

## 2018-11-06 DIAGNOSIS — M545 Low back pain: Secondary | ICD-10-CM | POA: Diagnosis not present

## 2018-11-06 DIAGNOSIS — R278 Other lack of coordination: Secondary | ICD-10-CM | POA: Diagnosis not present

## 2018-11-06 DIAGNOSIS — M6281 Muscle weakness (generalized): Secondary | ICD-10-CM | POA: Diagnosis not present

## 2018-11-06 DIAGNOSIS — R2681 Unsteadiness on feet: Secondary | ICD-10-CM | POA: Diagnosis not present

## 2018-11-07 DIAGNOSIS — R278 Other lack of coordination: Secondary | ICD-10-CM | POA: Diagnosis not present

## 2018-11-07 DIAGNOSIS — M6281 Muscle weakness (generalized): Secondary | ICD-10-CM | POA: Diagnosis not present

## 2018-11-07 DIAGNOSIS — M545 Low back pain: Secondary | ICD-10-CM | POA: Diagnosis not present

## 2018-11-07 DIAGNOSIS — R2681 Unsteadiness on feet: Secondary | ICD-10-CM | POA: Diagnosis not present

## 2018-11-09 DIAGNOSIS — R2681 Unsteadiness on feet: Secondary | ICD-10-CM | POA: Diagnosis not present

## 2018-11-09 DIAGNOSIS — R278 Other lack of coordination: Secondary | ICD-10-CM | POA: Diagnosis not present

## 2018-11-09 DIAGNOSIS — M6281 Muscle weakness (generalized): Secondary | ICD-10-CM | POA: Diagnosis not present

## 2018-11-09 DIAGNOSIS — M545 Low back pain: Secondary | ICD-10-CM | POA: Diagnosis not present

## 2018-11-10 DIAGNOSIS — E7800 Pure hypercholesterolemia, unspecified: Secondary | ICD-10-CM | POA: Diagnosis not present

## 2018-11-10 DIAGNOSIS — G301 Alzheimer's disease with late onset: Secondary | ICD-10-CM | POA: Diagnosis not present

## 2018-11-10 DIAGNOSIS — F0390 Unspecified dementia without behavioral disturbance: Secondary | ICD-10-CM | POA: Diagnosis not present

## 2018-11-10 DIAGNOSIS — C61 Malignant neoplasm of prostate: Secondary | ICD-10-CM | POA: Diagnosis not present

## 2018-11-10 DIAGNOSIS — M159 Polyosteoarthritis, unspecified: Secondary | ICD-10-CM | POA: Diagnosis not present

## 2018-11-10 DIAGNOSIS — N183 Chronic kidney disease, stage 3 (moderate): Secondary | ICD-10-CM | POA: Diagnosis not present

## 2018-11-13 DIAGNOSIS — R278 Other lack of coordination: Secondary | ICD-10-CM | POA: Diagnosis not present

## 2018-11-13 DIAGNOSIS — R2681 Unsteadiness on feet: Secondary | ICD-10-CM | POA: Diagnosis not present

## 2018-11-13 DIAGNOSIS — M545 Low back pain: Secondary | ICD-10-CM | POA: Diagnosis not present

## 2018-11-13 DIAGNOSIS — M6281 Muscle weakness (generalized): Secondary | ICD-10-CM | POA: Diagnosis not present

## 2018-11-14 DIAGNOSIS — M6281 Muscle weakness (generalized): Secondary | ICD-10-CM | POA: Diagnosis not present

## 2018-11-14 DIAGNOSIS — R2681 Unsteadiness on feet: Secondary | ICD-10-CM | POA: Diagnosis not present

## 2018-11-14 DIAGNOSIS — M545 Low back pain: Secondary | ICD-10-CM | POA: Diagnosis not present

## 2018-11-14 DIAGNOSIS — R278 Other lack of coordination: Secondary | ICD-10-CM | POA: Diagnosis not present

## 2018-11-15 DIAGNOSIS — M6281 Muscle weakness (generalized): Secondary | ICD-10-CM | POA: Diagnosis not present

## 2018-11-15 DIAGNOSIS — R2681 Unsteadiness on feet: Secondary | ICD-10-CM | POA: Diagnosis not present

## 2018-11-15 DIAGNOSIS — M545 Low back pain: Secondary | ICD-10-CM | POA: Diagnosis not present

## 2018-11-15 DIAGNOSIS — R278 Other lack of coordination: Secondary | ICD-10-CM | POA: Diagnosis not present

## 2018-11-20 DIAGNOSIS — M545 Low back pain: Secondary | ICD-10-CM | POA: Diagnosis not present

## 2018-11-20 DIAGNOSIS — R2681 Unsteadiness on feet: Secondary | ICD-10-CM | POA: Diagnosis not present

## 2018-11-20 DIAGNOSIS — M6281 Muscle weakness (generalized): Secondary | ICD-10-CM | POA: Diagnosis not present

## 2018-11-20 DIAGNOSIS — R278 Other lack of coordination: Secondary | ICD-10-CM | POA: Diagnosis not present

## 2018-11-21 DIAGNOSIS — M545 Low back pain: Secondary | ICD-10-CM | POA: Diagnosis not present

## 2018-11-21 DIAGNOSIS — R2681 Unsteadiness on feet: Secondary | ICD-10-CM | POA: Diagnosis not present

## 2018-11-21 DIAGNOSIS — R278 Other lack of coordination: Secondary | ICD-10-CM | POA: Diagnosis not present

## 2018-11-21 DIAGNOSIS — M6281 Muscle weakness (generalized): Secondary | ICD-10-CM | POA: Diagnosis not present

## 2018-11-22 DIAGNOSIS — M6281 Muscle weakness (generalized): Secondary | ICD-10-CM | POA: Diagnosis not present

## 2018-11-22 DIAGNOSIS — R278 Other lack of coordination: Secondary | ICD-10-CM | POA: Diagnosis not present

## 2018-11-22 DIAGNOSIS — M545 Low back pain: Secondary | ICD-10-CM | POA: Diagnosis not present

## 2018-11-22 DIAGNOSIS — R2681 Unsteadiness on feet: Secondary | ICD-10-CM | POA: Diagnosis not present

## 2018-11-27 DIAGNOSIS — M545 Low back pain: Secondary | ICD-10-CM | POA: Diagnosis not present

## 2018-11-27 DIAGNOSIS — R2681 Unsteadiness on feet: Secondary | ICD-10-CM | POA: Diagnosis not present

## 2018-11-27 DIAGNOSIS — M6281 Muscle weakness (generalized): Secondary | ICD-10-CM | POA: Diagnosis not present

## 2018-11-27 DIAGNOSIS — R278 Other lack of coordination: Secondary | ICD-10-CM | POA: Diagnosis not present

## 2018-11-29 DIAGNOSIS — R2681 Unsteadiness on feet: Secondary | ICD-10-CM | POA: Diagnosis not present

## 2018-11-29 DIAGNOSIS — R278 Other lack of coordination: Secondary | ICD-10-CM | POA: Diagnosis not present

## 2018-11-29 DIAGNOSIS — M6281 Muscle weakness (generalized): Secondary | ICD-10-CM | POA: Diagnosis not present

## 2018-11-29 DIAGNOSIS — M545 Low back pain: Secondary | ICD-10-CM | POA: Diagnosis not present

## 2018-11-30 DIAGNOSIS — M6281 Muscle weakness (generalized): Secondary | ICD-10-CM | POA: Diagnosis not present

## 2018-11-30 DIAGNOSIS — M545 Low back pain: Secondary | ICD-10-CM | POA: Diagnosis not present

## 2018-11-30 DIAGNOSIS — R2681 Unsteadiness on feet: Secondary | ICD-10-CM | POA: Diagnosis not present

## 2018-11-30 DIAGNOSIS — R278 Other lack of coordination: Secondary | ICD-10-CM | POA: Diagnosis not present

## 2018-12-04 DIAGNOSIS — M6281 Muscle weakness (generalized): Secondary | ICD-10-CM | POA: Diagnosis not present

## 2018-12-04 DIAGNOSIS — M545 Low back pain: Secondary | ICD-10-CM | POA: Diagnosis not present

## 2018-12-04 DIAGNOSIS — R278 Other lack of coordination: Secondary | ICD-10-CM | POA: Diagnosis not present

## 2018-12-04 DIAGNOSIS — R2681 Unsteadiness on feet: Secondary | ICD-10-CM | POA: Diagnosis not present

## 2018-12-06 DIAGNOSIS — M6281 Muscle weakness (generalized): Secondary | ICD-10-CM | POA: Diagnosis not present

## 2018-12-06 DIAGNOSIS — M545 Low back pain: Secondary | ICD-10-CM | POA: Diagnosis not present

## 2018-12-06 DIAGNOSIS — R278 Other lack of coordination: Secondary | ICD-10-CM | POA: Diagnosis not present

## 2018-12-06 DIAGNOSIS — R2681 Unsteadiness on feet: Secondary | ICD-10-CM | POA: Diagnosis not present

## 2018-12-08 ENCOUNTER — Other Ambulatory Visit: Payer: Self-pay

## 2018-12-08 MED ORDER — QUETIAPINE FUMARATE 25 MG PO TABS: 25.0000 mg | ORAL_TABLET | ORAL | 5 refills | Status: AC
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
## 2018-12-10 ENCOUNTER — Other Ambulatory Visit: Payer: Self-pay

## 2018-12-10 ENCOUNTER — Encounter (HOSPITAL_COMMUNITY): Payer: Self-pay | Admitting: Emergency Medicine

## 2018-12-10 ENCOUNTER — Emergency Department (HOSPITAL_COMMUNITY): Payer: Medicare HMO

## 2018-12-10 ENCOUNTER — Inpatient Hospital Stay (HOSPITAL_COMMUNITY)
Admission: EM | Admit: 2018-12-10 | Discharge: 2019-01-23 | DRG: 177 | Disposition: E | Payer: Medicare HMO | Attending: Internal Medicine | Admitting: Internal Medicine

## 2018-12-10 DIAGNOSIS — U071 COVID-19: Principal | ICD-10-CM

## 2018-12-10 DIAGNOSIS — R001 Bradycardia, unspecified: Secondary | ICD-10-CM | POA: Diagnosis not present

## 2018-12-10 DIAGNOSIS — M1990 Unspecified osteoarthritis, unspecified site: Secondary | ICD-10-CM | POA: Diagnosis present

## 2018-12-10 DIAGNOSIS — E878 Other disorders of electrolyte and fluid balance, not elsewhere classified: Secondary | ICD-10-CM | POA: Diagnosis not present

## 2018-12-10 DIAGNOSIS — K219 Gastro-esophageal reflux disease without esophagitis: Secondary | ICD-10-CM | POA: Diagnosis present

## 2018-12-10 DIAGNOSIS — Z86711 Personal history of pulmonary embolism: Secondary | ICD-10-CM

## 2018-12-10 DIAGNOSIS — E870 Hyperosmolality and hypernatremia: Secondary | ICD-10-CM | POA: Diagnosis present

## 2018-12-10 DIAGNOSIS — J302 Other seasonal allergic rhinitis: Secondary | ICD-10-CM | POA: Diagnosis present

## 2018-12-10 DIAGNOSIS — G9349 Other encephalopathy: Secondary | ICD-10-CM | POA: Diagnosis not present

## 2018-12-10 DIAGNOSIS — Z66 Do not resuscitate: Secondary | ICD-10-CM | POA: Diagnosis present

## 2018-12-10 DIAGNOSIS — K5900 Constipation, unspecified: Secondary | ICD-10-CM | POA: Diagnosis not present

## 2018-12-10 DIAGNOSIS — Z6826 Body mass index (BMI) 26.0-26.9, adult: Secondary | ICD-10-CM

## 2018-12-10 DIAGNOSIS — R7982 Elevated C-reactive protein (CRP): Secondary | ICD-10-CM | POA: Diagnosis not present

## 2018-12-10 DIAGNOSIS — R410 Disorientation, unspecified: Secondary | ICD-10-CM | POA: Diagnosis not present

## 2018-12-10 DIAGNOSIS — Z79899 Other long term (current) drug therapy: Secondary | ICD-10-CM | POA: Diagnosis not present

## 2018-12-10 DIAGNOSIS — Z7901 Long term (current) use of anticoagulants: Secondary | ICD-10-CM

## 2018-12-10 DIAGNOSIS — I824Z1 Acute embolism and thrombosis of unspecified deep veins of right distal lower extremity: Secondary | ICD-10-CM | POA: Diagnosis present

## 2018-12-10 DIAGNOSIS — R509 Fever, unspecified: Secondary | ICD-10-CM

## 2018-12-10 DIAGNOSIS — R918 Other nonspecific abnormal finding of lung field: Secondary | ICD-10-CM | POA: Diagnosis not present

## 2018-12-10 DIAGNOSIS — N400 Enlarged prostate without lower urinary tract symptoms: Secondary | ICD-10-CM | POA: Diagnosis present

## 2018-12-10 DIAGNOSIS — F0151 Vascular dementia with behavioral disturbance: Secondary | ICD-10-CM | POA: Diagnosis not present

## 2018-12-10 DIAGNOSIS — Z86718 Personal history of other venous thrombosis and embolism: Secondary | ICD-10-CM

## 2018-12-10 DIAGNOSIS — N179 Acute kidney failure, unspecified: Secondary | ICD-10-CM | POA: Diagnosis not present

## 2018-12-10 DIAGNOSIS — Z515 Encounter for palliative care: Secondary | ICD-10-CM | POA: Diagnosis not present

## 2018-12-10 DIAGNOSIS — R0902 Hypoxemia: Secondary | ICD-10-CM | POA: Diagnosis not present

## 2018-12-10 DIAGNOSIS — J9601 Acute respiratory failure with hypoxia: Secondary | ICD-10-CM | POA: Diagnosis not present

## 2018-12-10 DIAGNOSIS — Z209 Contact with and (suspected) exposure to unspecified communicable disease: Secondary | ICD-10-CM | POA: Diagnosis not present

## 2018-12-10 DIAGNOSIS — R64 Cachexia: Secondary | ICD-10-CM | POA: Diagnosis present

## 2018-12-10 DIAGNOSIS — Z87891 Personal history of nicotine dependence: Secondary | ICD-10-CM | POA: Diagnosis not present

## 2018-12-10 DIAGNOSIS — Z9079 Acquired absence of other genital organ(s): Secondary | ICD-10-CM

## 2018-12-10 DIAGNOSIS — F0390 Unspecified dementia without behavioral disturbance: Secondary | ICD-10-CM | POA: Diagnosis present

## 2018-12-10 DIAGNOSIS — J1289 Other viral pneumonia: Secondary | ICD-10-CM | POA: Diagnosis present

## 2018-12-10 DIAGNOSIS — F0391 Unspecified dementia with behavioral disturbance: Secondary | ICD-10-CM | POA: Diagnosis present

## 2018-12-10 DIAGNOSIS — I2699 Other pulmonary embolism without acute cor pulmonale: Secondary | ICD-10-CM | POA: Diagnosis present

## 2018-12-10 DIAGNOSIS — Z5329 Procedure and treatment not carried out because of patient's decision for other reasons: Secondary | ICD-10-CM | POA: Diagnosis not present

## 2018-12-10 DIAGNOSIS — T17908A Unspecified foreign body in respiratory tract, part unspecified causing other injury, initial encounter: Secondary | ICD-10-CM

## 2018-12-10 LAB — SARS CORONAVIRUS 2 BY RT PCR (HOSPITAL ORDER, PERFORMED IN ~~LOC~~ HOSPITAL LAB): SARS Coronavirus 2: POSITIVE — AB

## 2018-12-10 MED ORDER — ACETAMINOPHEN 650 MG RE SUPP
650.0000 mg | Freq: Once | RECTAL | Status: AC
  Administered 2018-12-10: 650 mg via RECTAL
  Filled 2018-12-10: qty 1

## 2018-12-10 MED ORDER — ACETAMINOPHEN 325 MG RE SUPP
325.0000 mg | Freq: Once | RECTAL | Status: AC
  Administered 2018-12-10: 325 mg via RECTAL
  Filled 2018-12-10: qty 1

## 2018-12-10 MED ORDER — ACETAMINOPHEN 500 MG PO TABS
1000.0000 mg | ORAL_TABLET | Freq: Once | ORAL | Status: DC
  Filled 2018-12-10: qty 2

## 2018-12-10 NOTE — ED Triage Notes (Signed)
Arrives via EMS from [facility], patient had a temp of 101.1, doctor requested patient to be taken to hospital. Patient has no complaints. CBG 97.  Pt is baseline demented.

## 2018-12-10 NOTE — ED Notes (Signed)
Patient refused COVID swab and Tylenol.
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
## 2018-12-11 ENCOUNTER — Encounter (HOSPITAL_COMMUNITY): Payer: Self-pay | Admitting: Family Medicine

## 2018-12-11 DIAGNOSIS — G9349 Other encephalopathy: Secondary | ICD-10-CM | POA: Diagnosis not present

## 2018-12-11 DIAGNOSIS — E870 Hyperosmolality and hypernatremia: Secondary | ICD-10-CM | POA: Diagnosis not present

## 2018-12-11 DIAGNOSIS — Z66 Do not resuscitate: Secondary | ICD-10-CM | POA: Diagnosis not present

## 2018-12-11 DIAGNOSIS — J1282 Pneumonia due to coronavirus disease 2019: Secondary | ICD-10-CM | POA: Diagnosis present

## 2018-12-11 DIAGNOSIS — R64 Cachexia: Secondary | ICD-10-CM | POA: Diagnosis not present

## 2018-12-11 DIAGNOSIS — F0391 Unspecified dementia with behavioral disturbance: Secondary | ICD-10-CM | POA: Diagnosis not present

## 2018-12-11 DIAGNOSIS — J9601 Acute respiratory failure with hypoxia: Secondary | ICD-10-CM | POA: Diagnosis not present

## 2018-12-11 DIAGNOSIS — J1289 Other viral pneumonia: Secondary | ICD-10-CM | POA: Diagnosis not present

## 2018-12-11 DIAGNOSIS — N179 Acute kidney failure, unspecified: Secondary | ICD-10-CM | POA: Diagnosis not present

## 2018-12-11 DIAGNOSIS — U071 COVID-19: Principal | ICD-10-CM | POA: Diagnosis present

## 2018-12-11 LAB — CBC
HCT: 40.8 % (ref 39.0–52.0)
Hemoglobin: 13.3 g/dL (ref 13.0–17.0)
MCH: 30.7 pg (ref 26.0–34.0)
MCHC: 32.6 g/dL (ref 30.0–36.0)
MCV: 94.2 fL (ref 80.0–100.0)
Platelets: 156 10*3/uL (ref 150–400)
RBC: 4.33 MIL/uL (ref 4.22–5.81)
RDW: 13.1 % (ref 11.5–15.5)
WBC: 2.8 10*3/uL — ABNORMAL LOW (ref 4.0–10.5)
nRBC: 0 % (ref 0.0–0.2)

## 2018-12-11 LAB — SEDIMENTATION RATE: Sed Rate: 12 mm/hr (ref 0–16)

## 2018-12-11 LAB — CREATININE, SERUM
Creatinine, Ser: 1.14 mg/dL (ref 0.61–1.24)
GFR calc Af Amer: >60 mL/min (ref >60)
GFR calc non Af Amer: 60 mL/min — ABNORMAL LOW (ref >60)

## 2018-12-11 LAB — ABO/RH: ABO/RH(D): A POS

## 2018-12-11 LAB — C-REACTIVE PROTEIN: CRP: <0.8 mg/dL (ref <1.0)

## 2018-12-11 LAB — D-DIMER, QUANTITATIVE: D-Dimer, Quant: <0.27 ug/mL-FEU (ref 0.00–0.50)

## 2018-12-11 MED ORDER — PHENOL 1.4 % MT LIQD
1.0000 | OROMUCOSAL | Status: DC | PRN
  Administered 2018-12-11: 1 via OROMUCOSAL
  Filled 2018-12-11: qty 177

## 2018-12-11 MED ORDER — HALOPERIDOL LACTATE 5 MG/ML IJ SOLN
5.0000 mg | Freq: Once | INTRAMUSCULAR | Status: AC
  Administered 2018-12-11: 5 mg via INTRAMUSCULAR
  Filled 2018-12-11: qty 1

## 2018-12-11 MED ORDER — FAMOTIDINE 20 MG PO TABS
20.0000 mg | ORAL_TABLET | Freq: Two times a day (BID) | ORAL | Status: DC
  Administered 2018-12-11 – 2018-12-20 (×17): 20 mg via ORAL
  Filled 2018-12-11 (×19): qty 1

## 2018-12-11 MED ORDER — PANTOPRAZOLE SODIUM 40 MG PO TBEC
40.0000 mg | DELAYED_RELEASE_TABLET | Freq: Every day | ORAL | Status: DC
  Administered 2018-12-11 – 2018-12-20 (×7): 40 mg via ORAL
  Filled 2018-12-11 (×9): qty 1

## 2018-12-11 MED ORDER — MEMANTINE HCL 10 MG PO TABS
10.0000 mg | ORAL_TABLET | Freq: Every day | ORAL | Status: DC
  Administered 2018-12-11 – 2018-12-20 (×8): 10 mg via ORAL
  Filled 2018-12-11 (×12): qty 1

## 2018-12-11 MED ORDER — ZINC SULFATE 220 (50 ZN) MG PO CAPS
220.0000 mg | ORAL_CAPSULE | Freq: Every day | ORAL | Status: DC
  Administered 2018-12-12 – 2018-12-20 (×6): 220 mg via ORAL
  Filled 2018-12-11 (×9): qty 1

## 2018-12-11 MED ORDER — RIVAROXABAN 20 MG PO TABS
20.0000 mg | ORAL_TABLET | Freq: Every evening | ORAL | Status: DC
  Administered 2018-12-12 – 2018-12-20 (×6): 20 mg via ORAL
  Filled 2018-12-11 (×11): qty 1

## 2018-12-11 MED ORDER — MIRABEGRON ER 25 MG PO TB24
50.0000 mg | ORAL_TABLET | Freq: Every evening | ORAL | Status: DC
  Administered 2018-12-12 – 2018-12-20 (×6): 50 mg via ORAL
  Filled 2018-12-11 (×11): qty 2

## 2018-12-11 MED ORDER — ONDANSETRON HCL 4 MG PO TABS: 4.0000 mg | ORAL_TABLET | Freq: Four times a day (QID) | ORAL | Status: DC | PRN

## 2018-12-11 MED ORDER — CITALOPRAM HYDROBROMIDE 10 MG PO TABS
10.0000 mg | ORAL_TABLET | Freq: Every day | ORAL | Status: DC
  Administered 2018-12-11 – 2018-12-20 (×8): 10 mg via ORAL
  Filled 2018-12-11 (×9): qty 1

## 2018-12-11 MED ORDER — DOCUSATE SODIUM 100 MG PO CAPS
100.0000 mg | ORAL_CAPSULE | Freq: Two times a day (BID) | ORAL | Status: DC
  Administered 2018-12-12 – 2018-12-20 (×10): 100 mg via ORAL
  Filled 2018-12-11 (×14): qty 1

## 2018-12-11 MED ORDER — ZINC SULFATE 220 (50 ZN) MG PO CAPS: 220.0000 mg | ORAL_CAPSULE | Freq: Every day | ORAL | Status: DC

## 2018-12-11 MED ORDER — TRAMADOL HCL 50 MG PO TABS
50.0000 mg | ORAL_TABLET | Freq: Four times a day (QID) | ORAL | Status: DC | PRN
  Administered 2018-12-12: 50 mg via ORAL
  Filled 2018-12-11: qty 1

## 2018-12-11 MED ORDER — ONDANSETRON HCL 4 MG/2ML IJ SOLN: 4.0000 mg | Freq: Four times a day (QID) | INTRAMUSCULAR | Status: DC | PRN

## 2018-12-11 MED ORDER — POLYETHYLENE GLYCOL 3350 17 G PO PACK
17.0000 g | PACK | Freq: Every day | ORAL | Status: DC | PRN
Start: 2018-12-11 — End: 2018-12-30

## 2018-12-11 MED ORDER — ACETAMINOPHEN 500 MG PO TABS: 500.0000 mg | ORAL_TABLET | Freq: Three times a day (TID) | ORAL | Status: DC | PRN

## 2018-12-11 MED ORDER — TRAZODONE HCL 50 MG PO TABS
50.0000 mg | ORAL_TABLET | Freq: Every evening | ORAL | Status: DC | PRN
  Administered 2018-12-13: 50 mg via ORAL
  Filled 2018-12-11: qty 1

## 2018-12-11 MED ORDER — ENOXAPARIN SODIUM 40 MG/0.4ML ~~LOC~~ SOLN: 40.0000 mg | SUBCUTANEOUS | Status: DC

## 2018-12-11 MED ORDER — IPRATROPIUM-ALBUTEROL 20-100 MCG/ACT IN AERS
1.0000 | INHALATION_SPRAY | Freq: Four times a day (QID) | RESPIRATORY_TRACT | Status: DC
  Administered 2018-12-12 – 2018-12-24 (×27): 1 via RESPIRATORY_TRACT
  Filled 2018-12-11: qty 4

## 2018-12-11 MED ORDER — HYDROCOD POLST-CPM POLST ER 10-8 MG/5ML PO SUER: 5.0000 mL | Freq: Two times a day (BID) | ORAL | Status: DC | PRN

## 2018-12-11 MED ORDER — QUETIAPINE FUMARATE 25 MG PO TABS: 25.0000 mg | ORAL_TABLET | ORAL | Status: DC

## 2018-12-11 MED ORDER — TRIAMCINOLONE ACETONIDE 0.1 % EX CREA
1.0000 "application " | TOPICAL_CREAM | Freq: Two times a day (BID) | CUTANEOUS | Status: DC
  Administered 2018-12-12 – 2018-12-29 (×24): 1 via TOPICAL
  Filled 2018-12-11: qty 15

## 2018-12-11 MED ORDER — ASPIRIN EC 81 MG PO TBEC
81.0000 mg | DELAYED_RELEASE_TABLET | Freq: Every day | ORAL | Status: DC
  Administered 2018-12-12 – 2018-12-20 (×6): 81 mg via ORAL
  Filled 2018-12-11 (×8): qty 1

## 2018-12-11 MED ORDER — VITAMIN D 25 MCG (1000 UNIT) PO TABS
1000.0000 [IU] | ORAL_TABLET | Freq: Every day | ORAL | Status: DC
  Administered 2018-12-12 – 2018-12-20 (×6): 1000 [IU] via ORAL
  Filled 2018-12-11 (×8): qty 1

## 2018-12-11 MED ORDER — IPRATROPIUM-ALBUTEROL 20-100 MCG/ACT IN AERS
1.0000 | INHALATION_SPRAY | Freq: Four times a day (QID) | RESPIRATORY_TRACT | Status: DC
  Filled 2018-12-11: qty 4

## 2018-12-11 MED ORDER — VITAMIN C 500 MG PO TABS: 500.0000 mg | ORAL_TABLET | Freq: Every day | ORAL | Status: DC

## 2018-12-11 MED ORDER — VITAMIN C 500 MG PO TABS
500.0000 mg | ORAL_TABLET | Freq: Every day | ORAL | Status: DC
  Administered 2018-12-12 – 2018-12-20 (×6): 500 mg via ORAL
  Filled 2018-12-11 (×8): qty 1

## 2018-12-11 MED ORDER — VITAMIN B-12 1000 MCG PO TABS
500.0000 ug | ORAL_TABLET | Freq: Every day | ORAL | Status: DC
  Administered 2018-12-11 – 2018-12-20 (×7): 500 ug via ORAL
  Filled 2018-12-11 (×8): qty 1
  Filled 2018-12-11: qty 0.5

## 2018-12-11 MED ORDER — GUAIFENESIN-DM 100-10 MG/5ML PO SYRP: 10.0000 mL | ORAL_SOLUTION | ORAL | Status: DC | PRN

## 2018-12-11 NOTE — ED Notes (Signed)
Pt spouse called for an update on pt. Let her know he is still at [facility] ED and waiting on transfer to [facility]. She said he can be combative and needs medications re-evaluated.

## 2018-12-11 NOTE — ED Notes (Signed)
Report given to Charge RN @ 1 E 116 [phone].   Patient needs IV before going over.

## 2018-12-11 NOTE — ED Notes (Signed)
[facility] departed 

## 2018-12-11 NOTE — ED Notes (Addendum)
Elmyra Ricks, supervisor at [facility] has spoke to his wife about quarantining patient and different options. Elmyra Ricks states they do have other patients at their facility her are covid positive. Explained to nicole patient is not getting admitted to [facility]. Patient has no symptoms and no temperature today.   Charge nurse spoke to [place] at [facility]. They wish for ED to wait till noon for them to figure out a plan for the patient to come back home to [facility].

## 2018-12-11 NOTE — ED Notes (Signed)
[facility] at bedside 

## 2018-12-11 NOTE — ED Notes (Signed)
Call lab, spoke with Ubaldo Glassing. Asked if we should type and screen at [facility] or [facility] since pt transporting to [facility] soon. Lab will hold the ABH/Rh blood tube.

## 2018-12-11 NOTE — ED Notes (Signed)
[facility] has been dispatched. Will be after shift changed before they arrive.
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
## 2018-12-11 NOTE — ED Provider Notes (Signed)
Blood pressure 125/66, pulse 64, temperature 98.7 F (37.1 C), temperature source Oral, resp. rate 18, SpO2 96 %.  Assuming care from Dr. Randal Buba  In short, Raymond Hopkins is a 84 y.o. male with a chief complaint of Fever .  Refer to the original H&P for additional details.  02:10 PM  Patient unable to return to nursing facility at this time.  No new oxygen requirement or indication for inpatient admission.  Spoke with the hospitalist who will leave a note after evaluation but do not plan on admitting.     Margette Fast, MD 12/12/18 1001

## 2018-12-11 NOTE — ED Notes (Signed)
Spoke with [place] of facility verbalizing she will call charge RN phone after speaking with [place]. Per Elmyra Ricks infection prevention provider attempting to see if admission to [facility] possible.

## 2018-12-11 NOTE — ED Notes (Signed)
Pt was standing at the door of his room yelling "Hey" and tapping on the glass. He had disconnected all the monitoring cords. I reoriented the pain and assisted him back to bed.

## 2018-12-11 NOTE — ED Notes (Addendum)
Attempted to call [facility] to give report. Spoke to RN and told to contact [place], Supervisor @ [phone]. Left voicemail for [place].

## 2018-12-11 NOTE — ED Notes (Signed)
Left voicemail for Elmyra Ricks to follow up related to note 1232.

## 2018-12-11 NOTE — ED Notes (Addendum)
Upon entering the room, pt was standing with underwear in hand, tangled in monitor cords, and appeared confused. Reoriented and helped him to bed. O2 sat was 80, so I applied 2 L O2.
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
## 2018-12-11 NOTE — ED Notes (Addendum)
[facility] watched me count the money in the pt's money clip. In his money clip he has $31, ACE card, and drivers license. Additionally, he had a pocket knife in his pants pocket. [facility] said they would let the receiving RN know the pt has these belongings.

## 2018-12-11 NOTE — Progress Notes (Signed)
CSW spoke with Elmyra Ricks, Librarian, academic at [employer], who reports they are working on a plan to bring patient back after patient tested positive for COVID. Elmyra Ricks reports she spoke with our ED charge nurse asking if we can give them until noon to figure out a plan. Elmyra Ricks reports she was hoping that we could find other placement for patient as they don't believe patient will do well in isolation due to his behaviors. CSW explained that patient will need to return and finding other placement will not be possible due to patient being positive for COVID and not having any other reasons for patient to be discharged from their facility.  CSW spoke with charge nurse who says she will be in contact with [facility] at noon. CSW is available for assistance.  CSW received call from patient's wife asking if we can find placement for patient at a locked facility. She reports she is concerned that patient will not remain isolated and will give COVID to other patient's at the facility. CSW explained that it will be best for patient to return to the facility where he came from and that placement to another facility will be difficult as he has tested positive for COVID. CSW also explained that we can not hold patient in the ED and he does not meet criteria for admission into the [facility] as he does not have current physical COVID symptoms.   [place], LCSW Transitions of Care Department [facility] ED [phone]

## 2018-12-11 NOTE — Progress Notes (Signed)
CSW spoke with supervisor Elmyra Ricks at [facility] and informed her that they are working to get a bed for patient at [facility]. Elmyra Ricks reports at this time they can not accept patient back and [place] will be best. Elmyra Ricks was thankful of our efforts.  [place], LCSW Transitions of Care Department [facility] ED [phone]

## 2018-12-11 NOTE — H&P (Signed)
History and Physical   Patient: Rene Paci                            PCP: Wenda Low, MD                    DOB: Jun 05, 1934            DOA: 12/11/2018 ZCH:[id number]             DOS: 12/11/2018, 3:42 PM  Patient coming from:   SNF - [facility] I have personally reviewed patient's medical records, in electronic medical records, including: [facility], and care everywhere.    Chief Complaint:   Chief Complaint  Patient presents with  . Fever    History of present illness:    NALIN MAZZOCCO is a 84 y.o. male with medical history significant of nausea, GERD, history of DVT, PE, BPH, depression, ect ... Presenting from memory unit [place], with mild shortness of breath, persistent dry cough.  T-max 100.4 In ED patient turned out to be covered positive  Patient Denies having: Fever, Chills, , Chest Pain, Abd pain, N/V/D, headache, dizziness, lightheadedness, joint pain, rash, open wounds  ED Course:   T-max 100.4, pulse 55, respiratory rate 18, blood pressure 116/61, satting 94% on room air SARS-CoV-2 positive  Review of Systems: As per HPI, otherwise 10 point review of systems were negative.   ----------------------------------------------------------------------------------------------------------------------  No Known Allergies  Home MEDs:  Prior to Admission medications   Medication Sig Start Date End Date Taking? Authorizing Provider  acetaminophen (TYLENOL) 500 MG tablet Take 500 mg by mouth 3 (three) times daily as needed for mild pain or headache.    Yes [provider]  cholecalciferol (VITAMIN D) 1000 UNITS tablet Take 1,000 Units by mouth daily.    Yes [provider]  citalopram (CELEXA) 10 MG tablet Take 1 tablet (10 mg total) by mouth daily. 10/31/18  Yes Jaffe, Adam R, DO  famotidine (PEPCID) 20 MG tablet Take 20 mg by mouth 2 (two) times daily.  03/28/18  Yes [provider]  memantine (NAMENDA) 10 MG tablet  TAKE ONE (1) TABLET BY MOUTH TWO (2) TIMES DAILY Patient taking differently: Take 10 mg by mouth daily.  04/10/18  Yes Jaffe, Adam R, DO  mirabegron ER (MYRBETRIQ) 50 MG TB24 tablet Take 50 mg by mouth every evening.   Yes [provider]  pantoprazole (PROTONIX) 40 MG tablet Take 40 mg by mouth daily.   Yes [provider]  polyethylene glycol (MIRALAX / GLYCOLAX) packet Take 17 g by mouth daily as needed for mild constipation. 09/14/16  Yes Vann, Jessica U, DO  QUEtiapine (SEROQUEL) 25 MG tablet Take 1 tablet (25 mg total) by mouth as directed. 1 tablet (25 mg) QAM; 2 tablets (50 mg) QHS Patient taking differently: Take 50 mg by mouth at bedtime.  12/08/18  Yes Jaffe, Adam R, DO  triamcinolone cream (KENALOG) 0.1 % Apply 1 application topically 2 (two) times daily.  06/19/18  Yes [provider]  vitamin B-12 (CYANOCOBALAMIN) 500 MCG tablet Take 500 mcg by mouth daily.    Yes [provider]  XARELTO 20 MG TABS tablet Take 20 mg by mouth every evening.  10/29/16  Yes [provider]  ketoconazole (NIZORAL) 2 % cream Apply 1 application topically daily. Apply to both feet and between toes once daily for 6 weeks Patient not taking: Reported  on 11/22/2018 07/20/18   Marzetta Board, DPM    PRN MEDs: acetaminophen, chlorpheniramine-HYDROcodone, guaiFENesin-dextromethorphan, ondansetron **OR** ondansetron (ZOFRAN) IV, polyethylene glycol, traMADol, traZODone  Past Medical History:  Diagnosis Date  . Arthritis   . Bilateral cataracts   . Blood transfusion   . BPH (benign prostatic hyperplasia)   . Dementia ([place])    early onset per wife  . GERD (gastroesophageal reflux disease)   . Memory changes   . Right leg DVT ([place])    august 2016, on Xarelto for 6 months  . Seasonal allergies     Past Surgical History:  Procedure Laterality Date  . CARPAL TUNNEL RELEASE  1990   bilateral  . cyst on spine  1959  . NASAL SINUS SURGERY     numerous ENT  surgeries  . PROSTATECTOMY N/A 10/13/2015   Procedure: SIMPLE OPEN RETROPUBIC PROSTATECTOMY ;  Surgeon: Carolan Clines, MD;  Location: [facility];  Service: Urology;  Laterality: N/A;  . SHOULDER SURGERY  2002   right  . TONSILLECTOMY       reports that he quit smoking about 55 years ago. His smoking use included cigarettes. He has never used smokeless tobacco. He reports that he does not drink alcohol or use drugs.   Family History  Problem Relation Age of Onset  . Dementia Father   . Dementia Brother   . Colon cancer Neg Hx   . Stomach cancer Neg Hx     Physical Exam:   Vitals:   12/11/18 1215 12/11/18 1412 12/11/18 1413 12/11/18 1441  BP: 125/66 116/61    Pulse:  (!) 57 (!) 57 (!) 55  Resp:  18    Temp:      TempSrc:      SpO2:  96% 96% 94%   Constitutional: NAD, calm, comfortable Eyes: PERRL, lids and conjunctivae normal ENMT: Mucous membranes are moist. Posterior pharynx clear of any exudate or lesions.Normal dentition.  Neck: normal, supple, no masses, no thyromegaly Respiratory: clear to auscultation bilaterally, no wheezing, no crackles. Normal respiratory effort. No accessory muscle use.  Cardiovascular: Regular rate and rhythm, no murmurs / rubs / gallops. No extremity edema. 2+ pedal pulses. No carotid bruits.  Abdomen: no tenderness, no masses palpated. No hepatosplenomegaly. Bowel sounds positive.  Musculoskeletal: no clubbing / cyanosis. No joint deformity upper and lower extremities. Good ROM, no contractures. Normal muscle tone.  Neurologic: CN II-XII grossly intact. Sensation intact, DTR normal. Strength 5/5 in all 4.  Psychiatric: Alert and oriented x1 normal mood.  Skin: no rashes, lesions, ulcers. No induration   Labs on admission:    I have personally reviewed following labs and imaging studies  Urine analysis:    Component Value Date/Time   COLORURINE YELLOW 09/12/2016 [place] 09/12/2016 1512   LABSPEC 1.025 09/12/2016 1512    PHURINE 7.0 09/12/2016 1512   GLUCOSEU NEGATIVE 09/12/2016 1512   HGBUR NEGATIVE 09/12/2016 1512   BILIRUBINUR NEGATIVE 09/12/2016 1512   KETONESUR NEGATIVE 09/12/2016 1512   PROTEINUR NEGATIVE 09/12/2016 1512   UROBILINOGEN 0.2 04/12/2008 0906   NITRITE NEGATIVE 09/12/2016 1512   LEUKOCYTESUR NEGATIVE 09/12/2016 1512     Radiologic Exams on Admission:   Dg Chest Portable 1 View  Result Date: 12/04/2018 CLINICAL DATA:  Fever.  Patient being tested for COVID-19. EXAM: PORTABLE CHEST 1 VIEW COMPARISON:  05/18/2017 FINDINGS: Lungs are somewhat hypoinflated without consolidation or effusion. Cardiomediastinal silhouette and remainder of the exam is unchanged. IMPRESSION: No active disease. Electronically Signed  By: Marin Olp M.D.   On: 11/25/2018 21:51    EKG:   Independently reviewed.  Orders placed or performed during the hospital encounter of 11/28/2018  . EKG 12-Lead  . EKG 12-Lead   --------------------------------------------------------------------------------------------------------------------------    Assessment / Plan:   Principal Problem:   COVID-19 virus infection Active Problems:   GERD (gastroesophageal reflux disease)   Arthritis   BPH (benign prostatic hyperplasia)   Acute deep vein thrombosis (DVT) of distal end of right lower extremity (HCC)   Bilateral pulmonary embolism (HCC)   Dementia (HCC)   History of DVT (deep vein thrombosis)   Principal Problem:   COVID-19 virus infection -We will admit the patient to [facility] -Close monitoring, currently patient is not hypoxic, will monitor closely O2 via nasal cannula maintain O2 sat greater than 92% -Inhalers as needed -Supportive therapy, at this time we will hold steroids, and medication  -We will order COVID markers and monitor closely  Active Problems:   GERD (gastroesophageal reflux disease) -Continue PPI   Arthritis -home medication PRN analgesics   BPH (benign prostatic hyperplasia) -for  urinary retention, resume home medication   Acute deep vein thrombosis (DVT) of distal end of right lower extremity (HCC) /  Bilateral pulmonary embolism (HCC) -We will continue chronic anticoagulation therapy with Xarelto    Dementia ([place]) -continue home medication: Namenda and Aricept    DVT prophylaxis: SCD/Compression stockings and Heparin SQ Code Status:   Code Status: Full Code  Family Communication: No family member present at bedside Disposition Plan:  Anticipated 1-2 days Consults called:  None Admission status: Patient will be admitted as Observation, with a less than 2 midnight length of stay.   Cultures:  - Antimicrobial: -  ---------------------------------------------------------------------------------------------------------------------------------------------------------------------------------------------------------------------------------------  Time spent: > than  40  Min.   SIGNED: Deatra James, MD, FACP, FHM. [facility],  Pager [phone]  If 7PM-7AM, please contact night-coverage [email or web address] Ota [facility] 12/11/2018, 3:42 PM

## 2018-12-11 NOTE — ED Notes (Signed)
Spoke with Bland Span of facility. Per Elmyra Ricks will call back at noon after given time make staffing arrangements to care for COVID pt.

## 2018-12-12 LAB — COMPREHENSIVE METABOLIC PANEL
ALT: 19 U/L (ref 0–44)
AST: 37 U/L (ref 15–41)
Albumin: 4 g/dL (ref 3.5–5.0)
Alkaline Phosphatase: 54 U/L (ref 38–126)
Anion gap: 12 (ref 5–15)
BUN: 16 mg/dL (ref 8–23)
CO2: 25 mmol/L (ref 22–32)
Calcium: 10 mg/dL (ref 8.9–10.3)
Chloride: 104 mmol/L (ref 98–111)
Creatinine, Ser: 1.14 mg/dL (ref 0.61–1.24)
GFR calc Af Amer: >60 mL/min (ref >60)
GFR calc non Af Amer: 60 mL/min — ABNORMAL LOW (ref >60)
Glucose, Bld: 113 mg/dL — ABNORMAL HIGH (ref 70–99)
Potassium: 4 mmol/L (ref 3.5–5.1)
Sodium: 141 mmol/L (ref 135–145)
Total Bilirubin: 0.5 mg/dL (ref 0.3–1.2)
Total Protein: 7.3 g/dL (ref 6.5–8.1)

## 2018-12-12 LAB — TROPONIN I (HIGH SENSITIVITY): Troponin I (High Sensitivity): 7 ng/L (ref <18)

## 2018-12-12 LAB — CBC WITH DIFFERENTIAL/PLATELET
Abs Immature Granulocytes: 0 10*3/uL (ref 0.00–0.07)
Basophils Absolute: 0 10*3/uL (ref 0.0–0.1)
Basophils Relative: 1 %
Eosinophils Absolute: 0 10*3/uL (ref 0.0–0.5)
Eosinophils Relative: 0 %
HCT: 41.6 % (ref 39.0–52.0)
Hemoglobin: 13.3 g/dL (ref 13.0–17.0)
Immature Granulocytes: 0 %
Lymphocytes Relative: 21 %
Lymphs Abs: 0.4 10*3/uL — ABNORMAL LOW (ref 0.7–4.0)
MCH: 30 pg (ref 26.0–34.0)
MCHC: 32 g/dL (ref 30.0–36.0)
MCV: 93.7 fL (ref 80.0–100.0)
Monocytes Absolute: 0.2 10*3/uL (ref 0.1–1.0)
Monocytes Relative: 11 %
Neutro Abs: 1.4 10*3/uL — ABNORMAL LOW (ref 1.7–7.7)
Neutrophils Relative %: 67 %
Platelets: 168 10*3/uL (ref 150–400)
RBC: 4.44 MIL/uL (ref 4.22–5.81)
RDW: 13.1 % (ref 11.5–15.5)
WBC: 2.1 10*3/uL — ABNORMAL LOW (ref 4.0–10.5)
nRBC: 0 % (ref 0.0–0.2)

## 2018-12-12 LAB — RESPIRATORY PANEL BY PCR

## 2018-12-12 LAB — TRIGLYCERIDES: Triglycerides: 78 mg/dL (ref <150)

## 2018-12-12 LAB — MRSA PCR SCREENING: MRSA by PCR: NEGATIVE

## 2018-12-12 LAB — LACTATE DEHYDROGENASE: LDH: 164 U/L (ref 98–192)

## 2018-12-12 LAB — TYPE AND SCREEN
ABO/RH(D): A POS
Antibody Screen: NEGATIVE

## 2018-12-12 LAB — FERRITIN: Ferritin: 29 ng/mL (ref 24–336)

## 2018-12-12 LAB — MAGNESIUM: Magnesium: 1.9 mg/dL (ref 1.7–2.4)

## 2018-12-12 LAB — ABO/RH: ABO/RH(D): A POS

## 2018-12-12 LAB — C-REACTIVE PROTEIN: CRP: <0.8 mg/dL (ref <1.0)

## 2018-12-12 LAB — BRAIN NATRIURETIC PEPTIDE: B Natriuretic Peptide: 22.9 pg/mL (ref 0.0–100.0)

## 2018-12-12 LAB — FIBRINOGEN: Fibrinogen: 312 mg/dL (ref 210–475)

## 2018-12-12 LAB — D-DIMER, QUANTITATIVE: D-Dimer, Quant: <0.27 ug/mL-FEU (ref 0.00–0.50)

## 2018-12-12 MED ORDER — QUETIAPINE FUMARATE 25 MG PO TABS
50.0000 mg | ORAL_TABLET | Freq: Every day | ORAL | Status: DC
  Administered 2018-12-12 (×2): 50 mg via ORAL
  Filled 2018-12-12 (×2): qty 2

## 2018-12-12 MED ORDER — METHYLPREDNISOLONE SODIUM SUCC 125 MG IJ SOLR
60.0000 mg | Freq: Three times a day (TID) | INTRAMUSCULAR | Status: DC
  Administered 2018-12-12 (×2): 60 mg via INTRAVENOUS
  Filled 2018-12-12 (×3): qty 2

## 2018-12-12 MED ORDER — HALOPERIDOL LACTATE 5 MG/ML IJ SOLN
1.0000 mg | Freq: Four times a day (QID) | INTRAMUSCULAR | Status: DC | PRN
  Administered 2018-12-12 – 2018-12-21 (×8): 1 mg via INTRAVENOUS
  Filled 2018-12-12 (×8): qty 1
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
## 2018-12-12 NOTE — Progress Notes (Signed)
Patient refusing to sit in bed, unsteady on feet. 1 mg haldol given.

## 2018-12-12 NOTE — Progress Notes (Signed)
Called wife to update on pt

## 2018-12-12 NOTE — Discharge Summary (Signed)
Physician Discharge Summary  Raymond Hopkins WUJ:[id number] DOB: 1934/11/25 DOA: 11/25/2018  PCP: Wenda Low, MD  Admit date: 12/04/2018 Discharge date: 12/12/2018  Admitted From: [facility] Disposition: SNF, pending acceptance  Discharge Condition: Stable CODE STATUS: Full Diet recommendation: As tolerated  Brief/Interim Summary: Patient admitted as above from [facility], unfortunately the facility had a COVID-19 exposure and patients were sent for further evaluation to [facility] medical system.  Known history of dementia, depression, nausea, GERD, lower extremity DVT, bilateral PE on Xarelto, BPH without lower urinary tract symptoms.  Patient currently on room air, patient remains medically stable for discharge, no fevers, patient has no acute complaints denies chills fever shortness of breath abdominal pain nausea vomiting diarrhea headache constipation dizziness lightheadedness. Chest x-ray unremarkable. Once patient able to secure safe disposition will discharge pending case management and social work assistance.  Patient remains admitted in observation status due to unsafe discharge as patient has no safe disposition location at this time.  At this time continue patient's home medications, Q-shift vitals but otherwise patient requires no additional inpatient or hospital care above baseline needs at memory care unit.  Discharge Diagnoses:  Principal Problem:   COVID-19 virus infection Active Problems:   GERD (gastroesophageal reflux disease)   Arthritis   BPH (benign prostatic hyperplasia)   Acute deep vein thrombosis (DVT) of distal end of right lower extremity (HCC)   Bilateral pulmonary embolism (HCC)   Dementia (HCC)   History of DVT (deep vein thrombosis)   Pneumonia due to COVID-19 virus  COVID-19 positive, incidentally noted Patient remains afebrile, without symptomatology, without hypoxia, no further treatment indicated at this  time, discharge pending as above.   Procedures/Studies: Dg Chest Portable 1 View  Result Date: 11/28/2018 CLINICAL DATA:  Fever.  Patient being tested for COVID-19. EXAM: PORTABLE CHEST 1 VIEW COMPARISON:  05/18/2017 FINDINGS: Lungs are somewhat hypoinflated without consolidation or effusion. Cardiomediastinal silhouette and remainder of the exam is unchanged. IMPRESSION: No active disease. Electronically Signed   By: Marin Olp M.D.   On: 12/01/2018 21:51     Subjective: History difficult to obtain given patient's baseline dementia, patient pleasantly eating breakfast this morning no acute complaints.   Discharge Exam: Vitals:   12/12/18 0500 12/12/18 0700  BP: 131/82 (!) 137/101  Pulse:  66  Resp:  17  Temp:  97.6 F (36.4 C)  SpO2:  96%   Vitals:   12/12/18 0000 12/12/18 0152 12/12/18 0500 12/12/18 0700  BP: 136/79 136/79 131/82 (!) 137/101  Pulse:  60  66  Resp: 16 18  17   Temp: 98.4 F (36.9 C) 98.4 F (36.9 C)  97.6 F (36.4 C)  TempSrc: Oral Oral Oral Oral  SpO2: 98%   96%   General:  Pleasantly resting in bed, No acute distress. HEENT:  Normocephalic atraumatic.  Sclerae nonicteric, noninjected.  Extraocular movements intact bilaterally. Neck:  Without mass or deformity.  Trachea is midline. Lungs:  Clear to auscultate bilaterally without rhonchi, wheeze, or rales. Heart:  Regular rate and rhythm.  Without murmurs, rubs, or gallops. Abdomen:  Soft, nontender, nondistended.  Without guarding or rebound. Extremities: Without cyanosis, clubbing, edema, or obvious deformity. Vascular:  Dorsalis pedis and posterior tibial pulses palpable bilaterally. Skin:  Warm and dry, no erythema, no ulcerations.  The results of significant diagnostics from this hospitalization (including imaging, microbiology, ancillary and laboratory) are listed below for reference.    Microbiology: Recent Results (from the past 240 hour(s))  SARS Coronavirus 2 (CEPHEID- Performed in [facility] hospital lab), Hosp Order     Status: Abnormal   Collection Time: 12/20/2018 10:27 PM   Specimen: Nasopharyngeal Swab  Result Value Ref Range Status   SARS Coronavirus 2 POSITIVE (A) NEGATIVE Final    Comment: RESULT CALLED TO, READ BACK BY AND VERIFIED WITH: GRIGG,J AT 2344 ON 12/18/2018 BY JPM (NOTE) If result is NEGATIVE SARS-CoV-2 target nucleic acids are NOT DETECTED. The SARS-CoV-2 RNA is generally detectable in upper and lower  respiratory specimens during the acute phase of infection. The lowest  concentration of SARS-CoV-2 viral copies this assay can detect is 250  copies / mL. A negative result does not preclude SARS-CoV-2 infection  and should not be used as the sole basis for treatment or other  patient management decisions.  A negative result may occur with  improper specimen collection / handling, submission of specimen other  than nasopharyngeal swab, presence of viral mutation(s) within the  areas targeted by this assay, and inadequate number of viral copies  (<250 copies / mL). A negative result must be combined with clinical  observations, patient history, and epidemiological information. If result is POSITIVE SARS-CoV-2 target nucleic acids are DETECTED. T he SARS-CoV-2 RNA is generally detectable in upper and lower  respiratory specimens during the acute phase of infection.  Positive  results are indicative of active infection with SARS-CoV-2.  Clinical  correlation with patient history and other diagnostic information is  necessary to determine patient infection status.  Positive results do  not rule out bacterial infection or co-infection with other viruses. If result is PRESUMPTIVE POSTIVE SARS-CoV-2 nucleic acids MAY BE PRESENT.   A presumptive positive result was obtained on the submitted specimen  and confirmed on repeat testing.  While 2019 novel coronavirus  (SARS-CoV-2) nucleic acids may be present in the submitted sample  additional confirmatory  testing may be necessary for epidemiological  and / or clinical management purposes  to differentiate between  SARS-CoV-2 and other Sarbecovirus currently known to infect humans.  If clinically indicated additional testing with an alternate test  methodology [phone]) is  advised. The SARS-CoV-2 RNA is generally  detectable in upper and lower respiratory specimens during the acute  phase of infection. The expected result is Negative. Fact Sheet for Patients:  StrictlyIdeas.no Fact Sheet for Healthcare Providers: BankingDealers.co.za This test is not yet approved or cleared by the [place] FDA and has been authorized for detection and/or diagnosis of SARS-CoV-2 by FDA under an Emergency Use Authorization (EUA).  This EUA will remain in effect (meaning this test can be used) for the duration of the COVID-19 declaration under Section 564(b)(1) of the Act, 21 U.S.C. section 360bbb-3(b)(1), unless the authorization is terminated or revoked sooner. Performed at [facility], [place] [address], [place], [place] [address]   MRSA PCR Screening     Status: None   Collection Time: 12/11/18 11:27 PM   Specimen: Nasal Mucosa; Nasopharyngeal  Result Value Ref Range Status   MRSA by PCR NEGATIVE NEGATIVE Final    Comment:        The GeneXpert MRSA Assay (FDA approved for NASAL specimens only), is one component of a comprehensive MRSA colonization surveillance program. It is not intended to diagnose MRSA infection nor to guide or monitor treatment for MRSA infections. Performed at [facility], [place] [address], [place], [place] [address]   Respiratory Panel by PCR     Status: None   Collection  Time: 12/12/18  6:50 AM  Result Value Ref Range Status   Adenovirus NOT DETECTED NOT DETECTED Final   Coronavirus 229E NOT DETECTED NOT DETECTED Final    Comment: (NOTE) The Coronavirus on the Respiratory Panel,  DOES NOT test for the novel  Coronavirus (2019 nCoV)    Coronavirus HKU1 NOT DETECTED NOT DETECTED Final   Coronavirus NL63 NOT DETECTED NOT DETECTED Final   Coronavirus OC43 NOT DETECTED NOT DETECTED Final   Metapneumovirus NOT DETECTED NOT DETECTED Final   Rhinovirus / Enterovirus NOT DETECTED NOT DETECTED Final   Influenza A NOT DETECTED NOT DETECTED Final   Influenza B NOT DETECTED NOT DETECTED Final   Parainfluenza Virus 1 NOT DETECTED NOT DETECTED Final   Parainfluenza Virus 2 NOT DETECTED NOT DETECTED Final   Parainfluenza Virus 3 NOT DETECTED NOT DETECTED Final   Parainfluenza Virus 4 NOT DETECTED NOT DETECTED Final   Respiratory Syncytial Virus NOT DETECTED NOT DETECTED Final   Bordetella pertussis NOT DETECTED NOT DETECTED Final   Chlamydophila pneumoniae NOT DETECTED NOT DETECTED Final   Mycoplasma pneumoniae NOT DETECTED NOT DETECTED Final    Comment: Performed at [facility], [place] [address] [place], [place] [address]     Labs: BNP (last 3 results) Recent Labs    12/12/18 0700  BNP 29.9   Basic Metabolic Panel: Recent Labs  Lab 12/11/18 1534 12/12/18 0700  NA  --  141  K  --  4.0  CL  --  104  CO2  --  25  GLUCOSE  --  113*  BUN  --  16  CREATININE 1.14 1.14  CALCIUM  --  10.0  MG  --  1.9   Liver Function Tests: Recent Labs  Lab 12/12/18 0700  AST 37  ALT 19  ALKPHOS 54  BILITOT 0.5  PROT 7.3  ALBUMIN 4.0   No results for input(s): LIPASE, AMYLASE in the last 168 hours. No results for input(s): AMMONIA in the last 168 hours. CBC: Recent Labs  Lab 12/11/18 1534 12/12/18 0700  WBC 2.8* 2.1*  NEUTROABS  --  1.4*  HGB 13.3 13.3  HCT 40.8 41.6  MCV 94.2 93.7  PLT 156 168   Cardiac Enzymes: No results for input(s): CKTOTAL, CKMB, CKMBINDEX, TROPONINI in the last 168 hours. BNP: Invalid input(s): POCBNP CBG: No results for input(s): GLUCAP in the last 168 hours. D-Dimer Recent Labs    12/11/18 1534 12/12/18 0700  DDIMER  <0.27 <0.27   Hgb A1c No results for input(s): HGBA1C in the last 72 hours. Lipid Profile Recent Labs    12/12/18 0700  TRIG 78   Thyroid function studies No results for input(s): TSH, T4TOTAL, T3FREE, THYROIDAB in the last 72 hours.  Invalid input(s): FREET3 Anemia work up Recent Labs    12/12/18 0700  FERRITIN 29   Urinalysis    Component Value Date/Time   COLORURINE YELLOW 09/12/2016 [place] 09/12/2016 1512   LABSPEC 1.025 09/12/2016 1512   PHURINE 7.0 09/12/2016 1512   GLUCOSEU NEGATIVE 09/12/2016 1512   HGBUR NEGATIVE 09/12/2016 1512   [place] 09/12/2016 1512   KETONESUR NEGATIVE 09/12/2016 1512   PROTEINUR NEGATIVE 09/12/2016 1512   UROBILINOGEN 0.2 04/12/2008 0906   NITRITE NEGATIVE 09/12/2016 1512   LEUKOCYTESUR NEGATIVE 09/12/2016 1512   Sepsis Labs Invalid input(s): PROCALCITONIN,  WBC,  LACTICIDVEN Microbiology Recent Results (from the past 240 hour(s))  SARS Coronavirus 2 (CEPHEID- Performed in [facility] hospital lab), [facility]  Status: Abnormal   Collection Time: 11/28/2018 10:27 PM   Specimen: Nasopharyngeal Swab  Result Value Ref Range Status   SARS Coronavirus 2 POSITIVE (A) NEGATIVE Final    Comment: RESULT CALLED TO, READ BACK BY AND VERIFIED WITH: GRIGG,J AT 2344 ON 11/30/2018 BY JPM (NOTE) If result is NEGATIVE SARS-CoV-2 target nucleic acids are NOT DETECTED. The SARS-CoV-2 RNA is generally detectable in upper and lower  respiratory specimens during the acute phase of infection. The lowest  concentration of SARS-CoV-2 viral copies this assay can detect is 250  copies / mL. A negative result does not preclude SARS-CoV-2 infection  and should not be used as the sole basis for treatment or other  patient management decisions.  A negative result may occur with  improper specimen collection / handling, submission of specimen other  than nasopharyngeal swab, presence of viral mutation(s) within the  areas  targeted by this assay, and inadequate number of viral copies  (<250 copies / mL). A negative result must be combined with clinical  observations, patient history, and epidemiological information. If result is POSITIVE SARS-CoV-2 target nucleic acids are DETECTED. T he SARS-CoV-2 RNA is generally detectable in upper and lower  respiratory specimens during the acute phase of infection.  Positive  results are indicative of active infection with SARS-CoV-2.  Clinical  correlation with patient history and other diagnostic information is  necessary to determine patient infection status.  Positive results do  not rule out bacterial infection or co-infection with other viruses. If result is PRESUMPTIVE POSTIVE SARS-CoV-2 nucleic acids MAY BE PRESENT.   A presumptive positive result was obtained on the submitted specimen  and confirmed on repeat testing.  While 2019 novel coronavirus  (SARS-CoV-2) nucleic acids may be present in the submitted sample  additional confirmatory testing may be necessary for epidemiological  and / or clinical management purposes  to differentiate between  SARS-CoV-2 and other Sarbecovirus currently known to infect humans.  If clinically indicated additional testing with an alternate test  methodology [phone]) is  advised. The SARS-CoV-2 RNA is generally  detectable in upper and lower respiratory specimens during the acute  phase of infection. The expected result is Negative. Fact Sheet for Patients:  StrictlyIdeas.no Fact Sheet for Healthcare Providers: BankingDealers.co.za This test is not yet approved or cleared by the [place] FDA and has been authorized for detection and/or diagnosis of SARS-CoV-2 by FDA under an Emergency Use Authorization (EUA).  This EUA will remain in effect (meaning this test can be used) for the duration of the COVID-19 declaration under Section 564(b)(1) of the Act, 21 U.S.C. section  360bbb-3(b)(1), unless the authorization is terminated or revoked sooner. Performed at [facility], [place] [address] [place], [place] [address]   MRSA PCR Screening     Status: None   Collection Time: 12/11/18 11:27 PM   Specimen: Nasal Mucosa; Nasopharyngeal  Result Value Ref Range Status   MRSA by PCR NEGATIVE NEGATIVE Final    Comment:        The GeneXpert MRSA Assay (FDA approved for NASAL specimens only), is one component of a comprehensive MRSA colonization surveillance program. It is not intended to diagnose MRSA infection nor to guide or monitor treatment for MRSA infections. Performed at [facility], [place] [address] [place], [place] [address]   Respiratory Panel by PCR     Status: None   Collection Time: 12/12/18  6:50 AM  Result Value Ref Range Status   Adenovirus NOT DETECTED  NOT DETECTED Final   Coronavirus 229E NOT DETECTED NOT DETECTED Final    Comment: (NOTE) The Coronavirus on the Respiratory Panel, DOES NOT test for the novel  Coronavirus (2019 nCoV)    Coronavirus HKU1 NOT DETECTED NOT DETECTED Final   Coronavirus NL63 NOT DETECTED NOT DETECTED Final   Coronavirus OC43 NOT DETECTED NOT DETECTED Final   Metapneumovirus NOT DETECTED NOT DETECTED Final   Rhinovirus / Enterovirus NOT DETECTED NOT DETECTED Final   Influenza A NOT DETECTED NOT DETECTED Final   Influenza B NOT DETECTED NOT DETECTED Final   Parainfluenza Virus 1 NOT DETECTED NOT DETECTED Final   Parainfluenza Virus 2 NOT DETECTED NOT DETECTED Final   Parainfluenza Virus 3 NOT DETECTED NOT DETECTED Final   Parainfluenza Virus 4 NOT DETECTED NOT DETECTED Final   Respiratory Syncytial Virus NOT DETECTED NOT DETECTED Final   Bordetella pertussis NOT DETECTED NOT DETECTED Final   Chlamydophila pneumoniae NOT DETECTED NOT DETECTED Final   Mycoplasma pneumoniae NOT DETECTED NOT DETECTED Final    Comment: Performed at [facility], [place] [address]  [place], [place] [address]   Time coordinating discharge: Over 30 minutes  SIGNED:  Little Ishikawa, DO [facility] 12/12/2018, 3:44 PM Pager   If 7PM-7AM, please contact night-coverage [email or web address] Password [email or web address]
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
## 2018-12-13 DIAGNOSIS — R410 Disorientation, unspecified: Secondary | ICD-10-CM

## 2018-12-13 LAB — BLOOD CULTURE ID PANEL (REFLEXED)

## 2018-12-13 LAB — INTERLEUKIN-6, PLASMA: Interleukin-6, Plasma: 3.2 pg/mL (ref 0.0–12.2)

## 2018-12-13 LAB — GLUCOSE 6 PHOSPHATE DEHYDROGENASE
G6PDH: 8.1 U/g{Hb} (ref 4.6–13.5)
Hemoglobin: 13.4 g/dL (ref 13.0–17.7)

## 2018-12-13 MED ORDER — QUETIAPINE FUMARATE 25 MG PO TABS
100.0000 mg | ORAL_TABLET | Freq: Every day | ORAL | Status: DC
  Administered 2018-12-13 – 2018-12-18 (×6): 100 mg via ORAL
  Filled 2018-12-13 (×7): qty 4
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
## 2018-12-13 NOTE — Care Management CC44 (Signed)
Condition Code 44 Documentation Completed  Patient Details  Name: Raymond Hopkins MRN: [id number] Date of Birth: 01/25/1935   Condition Code 44 given:  Yes Patient signature on Condition Code 44 notice:  Yes(Verbal permission of CSW signature) Documentation of 2 MD's agreement:    Code 44 added to claim:  Yes    [place]on Anna, LCSW 12/13/2018, 10:18 AM

## 2018-12-13 NOTE — Progress Notes (Signed)
PHARMACY - PHYSICIAN COMMUNICATION CRITICAL VALUE ALERT - BLOOD CULTURE IDENTIFICATION (BCID)  Raymond Hopkins is an 84 y.o. male who presented to [facility] on 11/27/2018 with a chief complaint of COVID+  Assessment:  Pt was admitted from [facility]. Lab called with BCID result of 1/2 bottles with likely CNS and only one set was done. This is likely a contaminant. Will FYI message Dr. Bonner Puna.   Name of physician (or Provider) Contacted: Dr. Bonner Puna  Current antibiotics: None  Changes to prescribed antibiotics recommended:  None  Results for orders placed or performed during the hospital encounter of 12/02/2018  Blood Culture ID Panel (Reflexed) (Collected: 12/12/2018  7:00 AM)  Result Value Ref Range   Enterococcus species NOT DETECTED NOT DETECTED   Listeria monocytogenes NOT DETECTED NOT DETECTED   Staphylococcus species DETECTED (A) NOT DETECTED   Staphylococcus aureus (BCID) NOT DETECTED NOT DETECTED   Methicillin resistance NOT DETECTED NOT DETECTED   Streptococcus species NOT DETECTED NOT DETECTED   Streptococcus agalactiae NOT DETECTED NOT DETECTED   Streptococcus pneumoniae NOT DETECTED NOT DETECTED   Streptococcus pyogenes NOT DETECTED NOT DETECTED   Acinetobacter baumannii NOT DETECTED NOT DETECTED   Enterobacteriaceae species NOT DETECTED NOT DETECTED   Enterobacter cloacae complex NOT DETECTED NOT DETECTED   Escherichia coli NOT DETECTED NOT DETECTED   Klebsiella oxytoca NOT DETECTED NOT DETECTED   Klebsiella pneumoniae NOT DETECTED NOT DETECTED   Proteus species NOT DETECTED NOT DETECTED   Serratia marcescens NOT DETECTED NOT DETECTED   Haemophilus influenzae NOT DETECTED NOT DETECTED   Neisseria meningitidis NOT DETECTED NOT DETECTED   Pseudomonas aeruginosa NOT DETECTED NOT DETECTED   Candida albicans NOT DETECTED NOT DETECTED   Candida glabrata NOT DETECTED NOT DETECTED   Candida krusei NOT DETECTED NOT DETECTED   Candida parapsilosis NOT DETECTED NOT DETECTED    Candida tropicalis NOT DETECTED NOT DETECTED    Onnie Boer, PharmD, BCIDP, AAHIVP, CPP Infectious Disease Pharmacist 12/13/2018 10:32 AM

## 2018-12-14 MED ORDER — QUETIAPINE FUMARATE 25 MG PO TABS
50.0000 mg | ORAL_TABLET | Freq: Every morning | ORAL | Status: DC
  Administered 2018-12-14 – 2018-12-18 (×4): 50 mg via ORAL
  Filled 2018-12-14 (×5): qty 2
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
## 2018-12-14 NOTE — Plan of Care (Signed)
Spoke with wife on phone to discuss patient status and POC. Also assisted with facetiming with patient.
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
## 2018-12-14 NOTE — Discharge Instructions (Signed)
Information on my medicine - XARELTO® (rivaroxaban) ° °This medication education was reviewed with me or my healthcare representative as part of my discharge preparation.  The pharmacist that spoke with me during my hospital stay was:  Faythe Heitzenrater, RPH-CPP ° °WHY WAS XARELTO® PRESCRIBED FOR YOU? °Xarelto® was prescribed to treat blood clots that may have been found in the veins of your legs (deep vein thrombosis) or in your lungs (pulmonary embolism) and to reduce the risk of them occurring again. ° °What do you need to know about Xarelto®? °Continue Xarelto 20 mg tablet taken ONCE A DAY with your evening meal. ° °DO NOT stop taking Xarelto® without talking to the health care provider who prescribed the medication.  Refill your prescription for 20 mg tablets before you run out. ° °After discharge, you should have regular check-up appointments with your healthcare provider that is prescribing your Xarelto®.  In the future your dose may need to be changed if your kidney function changes by a significant amount. ° °What do you do if you miss a dose? °If you are taking Xarelto® TWICE DAILY and you miss a dose, take it as soon as you remember. You may take two 15 mg tablets (total 30 mg) at the same time then resume your regularly scheduled 15 mg twice daily the next day. ° °If you are taking Xarelto® ONCE DAILY and you miss a dose, take it as soon as you remember on the same day then continue your regularly scheduled once daily regimen the next day. Do not take two doses of Xarelto® at the same time.  ° °Important Safety Information °Xarelto® is a blood thinner medicine that can cause bleeding. You should call your healthcare provider right away if you experience any of the following: °Bleeding from an injury or your nose that does not stop. °Unusual colored urine (red or dark brown) or unusual colored stools (red or black). °Unusual bruising for unknown reasons. °A serious fall or if you hit your head (even if there is  no bleeding). ° °Some medicines may interact with Xarelto® and might increase your risk of bleeding while on Xarelto®. To help avoid this, consult your healthcare provider or pharmacist prior to using any new prescription or non-prescription medications, including herbals, vitamins, non-steroidal anti-inflammatory drugs (NSAIDs) and supplements. ° °This website has more information on Xarelto®: www.xarelto.com. ° °

## 2018-12-15 LAB — CULTURE, BLOOD (ROUTINE X 2): Special Requests: ADEQUATE

## 2018-12-15 LAB — HEPATITIS B SURFACE ANTIGEN: Hepatitis B Surface Ag: NEGATIVE
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
## 2018-12-15 NOTE — Plan of Care (Signed)
  Problem: Coping: Goal: Psychosocial and spiritual needs will be supported Outcome: Progressing   Problem: Respiratory: Goal: Will maintain a patent airway Outcome: Progressing Goal: Complications related to the disease process, condition or treatment will be avoided or minimized Outcome: Progressing   Problem: Clinical Measurements: Goal: Respiratory complications will improve Outcome: Progressing Goal: Cardiovascular complication will be avoided Outcome: Progressing   Problem: Safety: Goal: Ability to remain free from injury will improve Outcome: Progressing   Problem: Skin Integrity: Goal: Risk for impaired skin integrity will decrease Outcome: Progressing   Problem: Education: Goal: Knowledge of risk factors and measures for prevention of condition will improve Outcome: Not Progressing Note: Due to cognitive deficits   Problem: Education: Goal: Knowledge of General Education information will improve Description: Including pain rating scale, medication(s)/side effects and non-pharmacologic comfort measures Outcome: Not Progressing Note: Due to cognitive deficits   Problem: Health Behavior/Discharge Planning: Goal: Ability to manage health-related needs will improve Outcome: Not Progressing Note: Due to cognitive deficits   Problem: Activity: Goal: Risk for activity intolerance will decrease Outcome: Not Progressing   Problem: Nutrition: Goal: Adequate nutrition will be maintained Outcome: Not Progressing Note: Poor appetite   Problem: Coping: Goal: Level of anxiety will decrease Outcome: Not Progressing Note: Agitated and combative

## 2018-12-15 NOTE — Progress Notes (Signed)
Patient has refused am vitals, assessment and medications.  Becomes hateful and combative when spoken to.

## 2018-12-16 LAB — NOVEL CORONAVIRUS, NAA (HOSP ORDER, SEND-OUT TO REF LAB; TAT 18-24 HRS): SARS-CoV-2, NAA: DETECTED — AB

## 2018-12-16 NOTE — Plan of Care (Signed)
Called wife to update on status and current POC. Also assisted with facetiming.
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
## 2018-12-16 NOTE — Progress Notes (Signed)
Facetimed wife.

## 2018-12-17 DIAGNOSIS — F0151 Vascular dementia with behavioral disturbance: Secondary | ICD-10-CM

## 2018-12-17 DIAGNOSIS — N400 Enlarged prostate without lower urinary tract symptoms: Secondary | ICD-10-CM

## 2018-12-17 DIAGNOSIS — Z86718 Personal history of other venous thrombosis and embolism: Secondary | ICD-10-CM

## 2018-12-17 LAB — CULTURE, BLOOD (ROUTINE X 2)
Culture: NO GROWTH
Special Requests: ADEQUATE
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
## 2018-12-19 LAB — NOVEL CORONAVIRUS, NAA (HOSP ORDER, SEND-OUT TO REF LAB; TAT 18-24 HRS): SARS-CoV-2, NAA: DETECTED — AB

## 2018-12-19 MED ORDER — ENSURE ENLIVE PO LIQD
237.0000 mL | Freq: Two times a day (BID) | ORAL | Status: DC
  Administered 2018-12-20 (×2): 237 mL via ORAL

## 2018-12-19 MED ORDER — QUETIAPINE FUMARATE 25 MG PO TABS
100.0000 mg | ORAL_TABLET | Freq: Two times a day (BID) | ORAL | Status: DC
  Administered 2018-12-19 – 2018-12-20 (×3): 100 mg via ORAL
  Filled 2018-12-19 (×3): qty 4

## 2018-12-19 NOTE — Progress Notes (Signed)
Physical Therapy Treatment Patient Details Name: FRANTZ QUATTRONE MRN: [id number] DOB: 10-27-1934 Today's Date: 12/19/2018    History of Present Illness KASSON LAMERE is an 84 y.o. male with a history of dementia who was sent from [facility] memory care with the chief complaint of fever. His memory care staff stated he seemed more weak and withdrawn than his baseline. There was no hypoxia or CXR infiltrates, Tmax 100.65F, found to be covid-positive with no elevation of inflammatory markers. Because the facility was unable to accept the patient back, despite clinical stability, he was brought to [place].    PT Comments    Patient is unable to participate in therapies, rewsistive and/or becomes more aggressive when  Attempting care which is variable. PT to sign of.   Follow Up Recommendations        Equipment Recommendations  None recommended by PT    Recommendations for Other Services       Precautions / Restrictions Precautions Precautions: Fall    Mobility  Bed Mobility                  Transfers                    Ambulation/Gait                 Stairs             Wheelchair Mobility    Modified Rankin (Stroke Patients Only)       Balance                                            Cognition Arousal/Alertness: Lethargic Behavior During Therapy: Agitated Overall Cognitive Status: History of cognitive impairments - at baseline                                 General Comments: pt balling fist with attempts to give food and water, closing mouth.      Exercises      General Comments        Pertinent Vitals/Pain      Home Living                      Prior Function            PT Goals (current goals can now be found in the care plan section) Progress towards PT goals: (pt unable to participate, becomes more agitated with attempts)    Frequency           PT Plan (PT to  sign off due to MS)    Co-evaluation PT/OT/SLP Co-Evaluation/Treatment: Yes Reason for Co-Treatment: Necessary to address cognition/behavior during functional activity          AM-PAC PT "6 Clicks" Mobility   Outcome Measure  Help needed turning from your back to your side while in a flat bed without using bedrails?: Total Help needed moving from lying on your back to sitting on the side of a flat bed without using bedrails?: Total Help needed moving to and from a bed to a chair (including a wheelchair)?: Total Help needed standing up from a chair using your arms (e.g., wheelchair or bedside chair)?: Total Help needed to walk in hospital room?: Total Help needed climbing 3-5 steps with a  railing? : Total 6 Click Score: 6    End of Session   Activity Tolerance: Treatment limited secondary to agitation Patient left: in bed;with call bell/phone within reach;with bed alarm set Nurse Communication: Mobility status       Time: 0900-0910 PT Time Calculation (min) (ACUTE ONLY): 10 min  Charges:    none                    [place]   Office [phone]    Claretha Cooper 12/19/2018, 12:15 PM

## 2018-12-19 NOTE — Progress Notes (Addendum)
OT Cancellation Note  Patient Details Name: Raymond Hopkins MRN: [id number] DOB: 27-Apr-1935   Cancelled Treatment:    Reason Eval/Treat Not Completed: Patient declined, no reason specified;Patient's level of consciousness;Other (comment)(Agitated). Upon arrival, pt supine in bed with his hands in fits. Resistive with eating breakfast and closing his mouth tight and turning head away. Agitated with NT earlier. Will return as schedule allows. Thank you.  [place], OTR/L Acute Rehab Pager: [phone] Office: [phone] 12/19/2018, 1:08 PM

## 2018-12-19 NOTE — Progress Notes (Signed)
Initial Nutrition Assessment RD working remotely.  DOCUMENTATION CODES:   Not applicable  INTERVENTION:    Ensure Enlive po BID, each supplement provides 350 kcal and 20 grams of protein   Pt receiving Hormel Shake daily with Breakfast which provides 520 kcals and 22 g of protein and Magic cup BID with lunch and dinner, each supplement provides 290 kcal and 9 grams of protein, automatically on meal trays to optimize nutritional intake.   NUTRITION DIAGNOSIS:   Increased nutrient needs related to acute illness(COVID) as evidenced by estimated needs.  GOAL:   Patient will meet greater than or equal to 90% of their needs  MONITOR:   PO intake, Supplement acceptance  REASON FOR ASSESSMENT:   Malnutrition Screening Tool    ASSESSMENT:   84 yo male admitted from memory care facility with fever, found to be COVID positive. PMH includes dementia, BPH, GERD.  Patient has been uncooperative with staff, attempting to hit PT and refusing to take medications. Unable to speak with patient at this time.   Repeat COVID testing result pending, negative test is needed before d/c back to SNF.  Labs reviewed. No new labs since 7/21.  Medications reviewed and include vitamin D3, colace, vitamin B12, vitamin C, zinc.    NUTRITION - FOCUSED PHYSICAL EXAM:  unable to complete due to COVID restrictions, working remotely  Diet Order:   Diet Order            Diet regular Room service appropriate? Yes; Fluid consistency: Thin  Diet effective now              EDUCATION NEEDS:   No education needs have been identified at this time  Skin:  Skin Assessment: Reviewed RN Assessment  Last BM:  7/20  Height:   Ht Readings from Last 1 Encounters:  12/12/18 5\' 8"  (1.727 m)    Weight:   Wt Readings from Last 1 Encounters:  04/25/18 78.9 kg    Ideal Body Weight:  70 kg  BMI:  Body mass index is 26.46 kg/m.  Estimated Nutritional Needs:   Kcal:  3338-3291  Protein:   85-105 gm  Fluid:  2 L    Molli Barrows, RD, LDN, [place] Pager [phone] After Hours Pager [phone]

## 2018-12-19 NOTE — Progress Notes (Signed)
PROGRESS NOTE  Brief Narrative: Raymond Hopkins is an 84 y.o. male with a history of dementia who was sent from [facility] memory care with the chief complaint of fever. His memory care staff stated he seemed more weak and withdrawn than his baseline. There was no hypoxia or CXR infiltrates, Tmax 100.39F, found to be covid-positive with no elevation of inflammatory markers. Because the facility was unable to accept the patient back, despite clinical stability, he was brought to [place]. PT has recommended returning to previous level of care. This is pending facility bed availability which requires negative covid testing. Last positive test was 7/24. Repeat test 7/27.  Subjective: Just took a swing at PT, has no complaints.   Objective: BP 99/76   Pulse (!) 110   Temp 98.5 F (36.9 C) (Axillary)   Resp 20   Ht 5\' 8"  (1.727 m)   SpO2 92%   BMI 26.46 kg/m   Gen: No distress Pulm: Even, nonlabored, clear.   Assessment & Plan: Principal Problem:   COVID-19 virus infection Active Problems:   GERD (gastroesophageal reflux disease)   Arthritis   BPH (benign prostatic hyperplasia)   Acute deep vein thrombosis (DVT) of distal end of right lower extremity (HCC)   Bilateral pulmonary embolism (HCC)   Dementia (HCC)   History of DVT (deep vein thrombosis)   Pneumonia due to COVID-19 virus  Covid-19 infection: Due to the patient age, he is at risk of developing severe disease, but is stable to be monitored in the outpatient setting. He does not currently meet inclusion criteria for remdesivir and steroids may pose greater risks than benefits at this time.  - Monitor oxygenation - He is medically stable for discharge, recommendation per therapy consultants is to return to memory care level of care. [facility] is unable/unwilling to accept the patient back to their facility. Requires negative testing which may take weeks. Repeat testing 7/27 is pending. Will need to balance need for disposition,  suspicion that he will remain persistently positive based on clinical experience, and need for RN safety/patient comfor in decisions for further testing. - PT/OT ordered to avoid deconditioning. Recommendation to return to memory care.  1 of 2 blood culture bottles growing non-aureus staphylococcus. Strongly suspect contaminant.  - Monitor fever curve and initiate Tx as indicated.  Dementia: Has been in memory care since 10/11/2018 and has been experiencing a precipitous decline since that time. - Return to memory care unit once able. Continue home medications.  - Delirium precautions, minimize restraints as much as possible. Augmented seroquel 50mg  > 100mg  qHS, added 50mg  AM dose. Will increase AM dose, now 100mg  BID.   Sinus bradycardia: Asymptomatic. BP has been normotensive.  - Avoid AV nodal agents.   History of RLE DVT:  - Continue xarelto 20mg  daily  BPH:  - Monitor UOP, no retention noted. - Continue home medications  Osteoarthritis:  - Tylenol, tramadol prn  GERD:  - Continue PPI  Time spent: 15 minutes  Patrecia Pour, MD Pager [phone] 12/19/2018, 9:28 AM

## 2018-12-20 DIAGNOSIS — K219 Gastro-esophageal reflux disease without esophagitis: Secondary | ICD-10-CM

## 2018-12-20 DIAGNOSIS — I824Z1 Acute embolism and thrombosis of unspecified deep veins of right distal lower extremity: Secondary | ICD-10-CM

## 2018-12-20 DIAGNOSIS — I2699 Other pulmonary embolism without acute cor pulmonale: Secondary | ICD-10-CM

## 2018-12-20 NOTE — Progress Notes (Signed)
PROGRESS NOTE  Brief Narrative: Raymond Hopkins is an 84 y.o. male with a history of dementia who was sent from [facility] memory care with the chief complaint of fever. His memory care staff stated he seemed more weak and withdrawn than his baseline. There was no hypoxia or CXR infiltrates, Tmax 100.77F, found to be covid-positive with no elevation of inflammatory markers. Because the facility was unable to accept the patient back, despite clinical stability, he was brought to [place]. PT has recommended returning to previous level of care. This is pending facility bed availability which requires negative covid testing. The patient has continued to test positive on 7/24 and 7/27.  Subjective: Continues to be combative, no complaints.   Objective: BP (!) 112/94   Pulse (!) 110   Temp 98.5 F (36.9 C) (Axillary)   Resp 16   Ht 5\' 8"  (1.727 m)   SpO2 92%   BMI 26.46 kg/m   Gen: No distress Pulm: Even, nonlabored, clear.   Assessment & Plan: Principal Problem:   COVID-19 virus infection Active Problems:   GERD (gastroesophageal reflux disease)   Arthritis   BPH (benign prostatic hyperplasia)   Acute deep vein thrombosis (DVT) of distal end of right lower extremity (HCC)   Bilateral pulmonary embolism (HCC)   Dementia (HCC)   History of DVT (deep vein thrombosis)   Pneumonia due to COVID-19 virus  Covid-19 infection: Due to the patient age, he is at risk of developing severe disease, but is stable to be monitored in the outpatient setting. He does not currently meet inclusion criteria for remdesivir and steroids may pose greater risks than benefits at this time.  - Monitor oxygenation - He is medically stable for discharge, recommendation per therapy consultants is to return to memory care level of care. [facility] is unable/unwilling to accept the patient back to their facility until the patient tests negative x2. Last check was positive, 7/27. Requires negative testing which may  take weeks. Will need to balance need for disposition, suspicion that he will remain persistently positive based on clinical experience, and need for RN safety/patient comfor in decisions for further testing. - PT/OT ordered to avoid deconditioning. Recommendation to return to memory care. He is not cooperative.  1 of 2 blood culture bottles growing non-aureus staphylococcus. Strongly suspect contaminant.  - Monitor fever curve and initiate Tx as indicated.  Dementia with behavioral disturbance: Has been in memory care since 10/11/2018 and has been experiencing a precipitous decline since that time. - Return to memory care unit once able. Continue home medications.  - Delirium precautions, minimize restraints as much as possible. - Augmented seroquel 50mg  > 100mg  qHS, added 50mg  AM dose. Later increased to 100mg  BID   Sinus bradycardia: Asymptomatic. BP has been normotensive.  - Avoid AV nodal agents.   History of RLE DVT:  - Continue xarelto 20mg  daily  BPH:  - Monitor UOP, no retention noted. - Continue home medications  Osteoarthritis:  - Tylenol, tramadol prn  GERD:  - Continue PPI  Time spent: 15 minutes  Patrecia Pour, MD Pager [phone] 12/20/2018, 12:11 PM
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
## 2018-12-20 NOTE — Progress Notes (Signed)
OT Cancellation Note  Patient Details Name: Raymond Hopkins MRN: [id number] DOB: 1934/08/21   Cancelled Treatment:    Reason Eval/Treat Not Completed: Other (comment)(Pt continues to be agitated and combative. Poor participation for therapy. Will sign off.)  [place], OTR/L Acute Rehab Pager: [phone] Office: [phone] 12/20/2018, 12:56 PM

## 2018-12-21 ENCOUNTER — Observation Stay (HOSPITAL_COMMUNITY): Payer: Medicare HMO

## 2018-12-21 DIAGNOSIS — N400 Enlarged prostate without lower urinary tract symptoms: Secondary | ICD-10-CM | POA: Diagnosis present

## 2018-12-21 DIAGNOSIS — R7982 Elevated C-reactive protein (CRP): Secondary | ICD-10-CM | POA: Diagnosis not present

## 2018-12-21 DIAGNOSIS — N179 Acute kidney failure, unspecified: Secondary | ICD-10-CM | POA: Diagnosis not present

## 2018-12-21 DIAGNOSIS — Z87891 Personal history of nicotine dependence: Secondary | ICD-10-CM | POA: Diagnosis not present

## 2018-12-21 DIAGNOSIS — F0391 Unspecified dementia with behavioral disturbance: Secondary | ICD-10-CM | POA: Diagnosis present

## 2018-12-21 DIAGNOSIS — G9349 Other encephalopathy: Secondary | ICD-10-CM | POA: Diagnosis not present

## 2018-12-21 DIAGNOSIS — E878 Other disorders of electrolyte and fluid balance, not elsewhere classified: Secondary | ICD-10-CM | POA: Diagnosis not present

## 2018-12-21 DIAGNOSIS — Z86711 Personal history of pulmonary embolism: Secondary | ICD-10-CM | POA: Diagnosis not present

## 2018-12-21 DIAGNOSIS — K219 Gastro-esophageal reflux disease without esophagitis: Secondary | ICD-10-CM | POA: Diagnosis present

## 2018-12-21 DIAGNOSIS — R64 Cachexia: Secondary | ICD-10-CM | POA: Diagnosis present

## 2018-12-21 DIAGNOSIS — R918 Other nonspecific abnormal finding of lung field: Secondary | ICD-10-CM | POA: Diagnosis not present

## 2018-12-21 DIAGNOSIS — Z86718 Personal history of other venous thrombosis and embolism: Secondary | ICD-10-CM | POA: Diagnosis not present

## 2018-12-21 DIAGNOSIS — Z7901 Long term (current) use of anticoagulants: Secondary | ICD-10-CM | POA: Diagnosis not present

## 2018-12-21 DIAGNOSIS — Z9079 Acquired absence of other genital organ(s): Secondary | ICD-10-CM | POA: Diagnosis not present

## 2018-12-21 DIAGNOSIS — E870 Hyperosmolality and hypernatremia: Secondary | ICD-10-CM | POA: Diagnosis present

## 2018-12-21 DIAGNOSIS — R509 Fever, unspecified: Secondary | ICD-10-CM | POA: Diagnosis present

## 2018-12-21 DIAGNOSIS — Z66 Do not resuscitate: Secondary | ICD-10-CM | POA: Diagnosis present

## 2018-12-21 DIAGNOSIS — J302 Other seasonal allergic rhinitis: Secondary | ICD-10-CM | POA: Diagnosis present

## 2018-12-21 DIAGNOSIS — Z515 Encounter for palliative care: Secondary | ICD-10-CM | POA: Diagnosis not present

## 2018-12-21 DIAGNOSIS — R001 Bradycardia, unspecified: Secondary | ICD-10-CM | POA: Diagnosis not present

## 2018-12-21 DIAGNOSIS — J9601 Acute respiratory failure with hypoxia: Secondary | ICD-10-CM | POA: Diagnosis not present

## 2018-12-21 DIAGNOSIS — J1289 Other viral pneumonia: Secondary | ICD-10-CM | POA: Diagnosis present

## 2018-12-21 DIAGNOSIS — Z79899 Other long term (current) drug therapy: Secondary | ICD-10-CM | POA: Diagnosis not present

## 2018-12-21 DIAGNOSIS — K5900 Constipation, unspecified: Secondary | ICD-10-CM | POA: Diagnosis not present

## 2018-12-21 DIAGNOSIS — M1990 Unspecified osteoarthritis, unspecified site: Secondary | ICD-10-CM | POA: Diagnosis present

## 2018-12-21 DIAGNOSIS — U071 COVID-19: Secondary | ICD-10-CM | POA: Diagnosis present

## 2018-12-21 LAB — CBC WITH DIFFERENTIAL/PLATELET
Abs Immature Granulocytes: 0.01 10*3/uL (ref 0.00–0.07)
Basophils Absolute: 0 10*3/uL (ref 0.0–0.1)
Basophils Relative: 0 %
Eosinophils Absolute: 0 10*3/uL (ref 0.0–0.5)
Eosinophils Relative: 0 %
HCT: 52.2 % — ABNORMAL HIGH (ref 39.0–52.0)
Hemoglobin: 16.3 g/dL (ref 13.0–17.0)
Immature Granulocytes: 0 %
Lymphocytes Relative: 10 %
Lymphs Abs: 0.5 10*3/uL — ABNORMAL LOW (ref 0.7–4.0)
MCH: 29.9 pg (ref 26.0–34.0)
MCHC: 31.2 g/dL (ref 30.0–36.0)
MCV: 95.6 fL (ref 80.0–100.0)
Monocytes Absolute: 0.5 10*3/uL (ref 0.1–1.0)
Monocytes Relative: 10 %
Neutro Abs: 4.3 10*3/uL (ref 1.7–7.7)
Neutrophils Relative %: 80 %
Platelets: 179 10*3/uL (ref 150–400)
RBC: 5.46 MIL/uL (ref 4.22–5.81)
RDW: 13 % (ref 11.5–15.5)
WBC: 5.4 10*3/uL (ref 4.0–10.5)
nRBC: 0 % (ref 0.0–0.2)

## 2018-12-21 LAB — COMPREHENSIVE METABOLIC PANEL
ALT: 27 U/L (ref 0–44)
AST: 28 U/L (ref 15–41)
Albumin: 3.9 g/dL (ref 3.5–5.0)
Alkaline Phosphatase: 56 U/L (ref 38–126)
Anion gap: 11 (ref 5–15)
BUN: 42 mg/dL — ABNORMAL HIGH (ref 8–23)
CO2: 27 mmol/L (ref 22–32)
Calcium: 12 mg/dL — ABNORMAL HIGH (ref 8.9–10.3)
Chloride: 112 mmol/L — ABNORMAL HIGH (ref 98–111)
Creatinine, Ser: 1.34 mg/dL — ABNORMAL HIGH (ref 0.61–1.24)
GFR calc Af Amer: 57 mL/min — ABNORMAL LOW (ref >60)
GFR calc non Af Amer: 49 mL/min — ABNORMAL LOW (ref >60)
Glucose, Bld: 107 mg/dL — ABNORMAL HIGH (ref 70–99)
Potassium: 4.9 mmol/L (ref 3.5–5.1)
Sodium: 150 mmol/L — ABNORMAL HIGH (ref 135–145)
Total Bilirubin: 0.9 mg/dL (ref 0.3–1.2)
Total Protein: 8.2 g/dL — ABNORMAL HIGH (ref 6.5–8.1)

## 2018-12-21 LAB — BASIC METABOLIC PANEL
Anion gap: 12 (ref 5–15)
Anion gap: 12 (ref 5–15)
BUN: 44 mg/dL — ABNORMAL HIGH (ref 8–23)
BUN: 42 mg/dL — ABNORMAL HIGH (ref 8–23)
CO2: 25 mmol/L (ref 22–32)
CO2: 25 mmol/L (ref 22–32)
Calcium: 12 mg/dL — ABNORMAL HIGH (ref 8.9–10.3)
Calcium: 11.2 mg/dL — ABNORMAL HIGH (ref 8.9–10.3)
Chloride: 115 mmol/L — ABNORMAL HIGH (ref 98–111)
Chloride: 112 mmol/L — ABNORMAL HIGH (ref 98–111)
Creatinine, Ser: 1.54 mg/dL — ABNORMAL HIGH (ref 0.61–1.24)
Creatinine, Ser: 1.36 mg/dL — ABNORMAL HIGH (ref 0.61–1.24)
GFR calc Af Amer: 48 mL/min — ABNORMAL LOW (ref >60)
GFR calc Af Amer: 56 mL/min — ABNORMAL LOW (ref >60)
GFR calc non Af Amer: 42 mL/min — ABNORMAL LOW (ref >60)
GFR calc non Af Amer: 48 mL/min — ABNORMAL LOW (ref >60)
Glucose, Bld: 169 mg/dL — ABNORMAL HIGH (ref 70–99)
Glucose, Bld: 175 mg/dL — ABNORMAL HIGH (ref 70–99)
Potassium: 4.8 mmol/L (ref 3.5–5.1)
Potassium: 4.1 mmol/L (ref 3.5–5.1)
Sodium: 152 mmol/L — ABNORMAL HIGH (ref 135–145)
Sodium: 149 mmol/L — ABNORMAL HIGH (ref 135–145)

## 2018-12-21 LAB — C-REACTIVE PROTEIN: CRP: 13.5 mg/dL — ABNORMAL HIGH (ref <1.0)

## 2018-12-21 MED ORDER — DEXTROSE 5 % IV SOLN
INTRAVENOUS | Status: DC
  Administered 2018-12-21: 18:00:00 via INTRAVENOUS

## 2018-12-21 MED ORDER — ORAL CARE MOUTH RINSE
15.0000 mL | Freq: Two times a day (BID) | OROMUCOSAL | Status: DC
  Administered 2018-12-21 – 2018-12-30 (×17): 15 mL via OROMUCOSAL

## 2018-12-21 MED ORDER — DEXTROSE-NACL 5-0.45 % IV SOLN
INTRAVENOUS | Status: DC
  Administered 2018-12-21: 10:00:00 via INTRAVENOUS

## 2018-12-21 MED ORDER — FAMOTIDINE IN NACL 20-0.9 MG/50ML-% IV SOLN
20.0000 mg | Freq: Every day | INTRAVENOUS | Status: DC
  Administered 2018-12-21 – 2018-12-23 (×3): 20 mg via INTRAVENOUS
  Filled 2018-12-21 (×3): qty 50

## 2018-12-21 MED ORDER — HALOPERIDOL LACTATE 5 MG/ML IJ SOLN: 2.0000 mg | Freq: Four times a day (QID) | INTRAMUSCULAR | Status: DC | PRN

## 2018-12-21 MED ORDER — CHLORHEXIDINE GLUCONATE 0.12 % MT SOLN
15.0000 mL | Freq: Two times a day (BID) | OROMUCOSAL | Status: DC
  Administered 2018-12-22 – 2018-12-23 (×2): 15 mL via OROMUCOSAL
  Filled 2018-12-21: qty 15

## 2018-12-21 MED ORDER — QUETIAPINE FUMARATE 25 MG PO TABS: 50.0000 mg | ORAL_TABLET | Freq: Two times a day (BID) | ORAL | Status: DC

## 2018-12-21 MED ORDER — DEXAMETHASONE SODIUM PHOSPHATE 10 MG/ML IJ SOLN
6.0000 mg | Freq: Every day | INTRAMUSCULAR | Status: DC
  Administered 2018-12-21 – 2018-12-27 (×7): 6 mg via INTRAVENOUS
  Filled 2018-12-21 (×8): qty 1

## 2018-12-21 NOTE — Progress Notes (Signed)
Night shift hospitalist coverage note.  The patient had some trouble swallowing crushed meds with applesauce.  He was unable to swallow them.  The nursing staff had to a scrape food/medication mix due to concern for aspiration. He has been placed n.p.o.  A portable chest radiograph has been ordered.  Tennis Must, MD.

## 2018-12-21 NOTE — Progress Notes (Signed)
PROGRESS NOTE  Brief Narrative: Raymond Hopkins is an 84 y.o. male with a history of dementia who was sent from [facility] memory care with the chief complaint of fever. His memory care staff stated he seemed more weak and withdrawn than his baseline. There was no hypoxia or CXR infiltrates, Tmax 100.14F, found to be covid-positive with no elevation of inflammatory markers. Because the facility was unable to accept the patient back, despite clinical stability, he was brought to [place]. PT has recommended returning to previous level of care. This is pending facility bed availability which requires negative covid testing. The patient has continued to test positive on 7/24 and 7/27.  Subjective: Patient is nonverbal.  Not following any commands.  Wakes up on verbal cues.  Occasionally agitated.  No other acute events reported overnight.  Objective: BP 125/74 (BP Location: Left Leg)   Pulse 96   Temp 99.5 F (37.5 C) (Oral)   Resp 16   Ht 5\' 8"  (1.727 m)   SpO2 92%   BMI 26.46 kg/m   General: lethargic and not oriented to time, place, and person. Appear in moderate distress, affect unresponsive Eyes: PERRL, Conjunctiva normal ENT: Oral Mucosa shows Dry gray discoloration  Neck: difficult to assess  JVD, no Abnormal Mass Or lumps Cardiovascular: S1 and S2 Present, aortic systolic Murmur, peripheral pulses symmetrical Respiratory: normal respiratory effort, Bilateral Air entry equal and Decreased, no use of accessory muscle, basal Crackles, no wheezes Abdomen: Bowel Sound present, Soft and difficult to assess  tenderness, no hernia Skin: no rashes  Extremities: no Pedal edema, no calf tenderness Neurologic: Awake on verbal cue.  Not following any commands.  Confused. Gait not checked due to patient safety concerns   CBC Latest Ref Rng & Units 12/21/2018 12/12/2018 12/12/2018  WBC 4.0 - 10.5 K/uL 5.4 2.1(L) -  Hemoglobin 13.0 - 17.0 g/dL 16.3 13.3 13.4  Hematocrit 39.0 - 52.0 % 52.2(H) 41.6 -   Platelets 150 - 400 K/uL 179 168 -    BMP Latest Ref Rng & Units 12/21/2018 12/21/2018 12/12/2018  Glucose 70 - 99 mg/dL 169(H) 107(H) 113(H)  BUN 8 - 23 mg/dL 44(H) 42(H) 16  Creatinine 0.61 - 1.24 mg/dL 1.54(H) 1.34(H) 1.14  Sodium 135 - 145 mmol/L 152(H) 150(H) 141  Potassium 3.5 - 5.1 mmol/L 4.8 4.9 4.0  Chloride 98 - 111 mmol/L 115(H) 112(H) 104  CO2 22 - 32 mmol/L 25 27 25   Calcium 8.9 - 10.3 mg/dL 12.0(H) 12.0(H) 10.0     Lab Results  Component Value Date   SARSCOV2NAA DETECTED (A) 12/18/2018   SARSCOV2NAA DETECTED (A) 12/15/2018   SARSCOV2NAA POSITIVE (A) 12/14/2018   CXR: hazy bilateral peripheral opacities  Recent Labs    12/21/18 0306  CRP 13.5*   Assessment & Plan: Principal Problem:   COVID-19 virus infection Active Problems:   GERD (gastroesophageal reflux disease)   Arthritis   BPH (benign prostatic hyperplasia)   Acute deep vein thrombosis (DVT) of distal end of right lower extremity ([place])   Bilateral pulmonary embolism (HCC)   Dementia (HCC)   History of DVT (deep vein thrombosis)   Pneumonia due to COVID-19 virus  Covid-19 infection: Due to the patient age, he is at risk of developing severe disease, Now becoming hypoxic and tachycardic.  CRP elevated from 0.08-13.8. Chest x-ray on 12/21/2018 also shows bilateral pneumonia. We will start the patient on IV Decadron and monitor response. Pending renal function will consider Remdesivir if does not improve tomorrow. Monitor oxygenation Patient's  current condition is concerning and anticipate poor prognosis down the road given worsening COVID infection.  This was communicated with the family.  1 of 2 blood culture bottles growing non-aureus staphylococcus. Strongly suspect contaminant.  - Monitor fever curve and initiate Tx as indicated.  Dementia with behavioral disturbance: Has been in memory care since 10/11/2018 and has been experiencing a precipitous decline since that time. - Return to memory care  unit once able. Continue home medications.  - Delirium precautions, minimize restraints as much as possible. -Patient is on Seroquel 50 mg nightly dose increased to 100 mg twice daily will reduce the dose to 50 mg twice daily. PRN Haldol dose increased.  Sinus bradycardia: Asymptomatic. BP has been normotensive.  - Avoid AV nodal agents.   History of RLE DVT:  - Continue xarelto 20mg  daily  BPH:  - Monitor UOP, no retention noted.  Bladder scan as needed. - Continue home medications  Osteoarthritis:  - Tylenol, tramadol prn  GERD:  - Continue PPI  Hypernatremia. Hypercalcemia. Hyperchloremia Continue to treat with IV fluids.  Concern for aspiration. Continue n.p.o. Speech therapy consulted.  Goals of care discussion. Had an extensive discussion with patient's spouse on 12/21/2018. Patient had progressed rapidly regarding his dementia this year. Currently significantly agitated requiring medication to calm him down. Minimal p.o. intake which has led to acute kidney injury as well as hyponatremia as well as hypercalcemia. Despite hydration condition is worsening. Speech therapy consulted and patient remains at risk for aspiration. With an ongoing active COVID-19 infection which appears to be worsening with CRP trending from 0.08-13 as well as chest x-ray showing bilateral pneumonia as well as hypoxia I am concerned that the patient's prognosis remains significantly poor. Reportedly patient was in the process of completing a most form with his PCP. Patient's spouse who is the healthcare power of attorney was informed regarding above situation. She was informed that the patient's prognosis remains poor and going through CPR or ventilation may not be in patient's best interest given his current mentation as well as COVID-19 infection. She currently agrees and confirms to transition patient from full code to DNR/DNI. She was also informed that if the patient's condition do not  improve within 24 to 48 hours comfort care would be the best way to go which he agrees as well.   Time spent: 35 minutes  Berle Mull, MD Pager [phone] 12/21/2018, 6:05 PM

## 2018-12-21 NOTE — Evaluation (Signed)
Clinical/Bedside Swallow Evaluation Patient Details  Name: CAIUS SILBERNAGEL MRN: [id number] Date of Birth: 12-09-1934  Today's Date: 12/21/2018 Time: SLP Start Time (ACUTE ONLY): 53 SLP Stop Time (ACUTE ONLY): 1257 SLP Time Calculation (min) (ACUTE ONLY): 21 min  Past Medical History:  Past Medical History:  Diagnosis Date  . Arthritis   . Bilateral cataracts   . Blood transfusion   . BPH (benign prostatic hyperplasia)   . Dementia ([place])    early onset per wife  . GERD (gastroesophageal reflux disease)   . Memory changes   . Right leg DVT ([place])    august 2016, on Xarelto for 6 months  . Seasonal allergies    Past Surgical History:  Past Surgical History:  Procedure Laterality Date  . CARPAL TUNNEL RELEASE  1990   bilateral  . cyst on spine  1959  . NASAL SINUS SURGERY     numerous ENT surgeries  . PROSTATECTOMY N/A 10/13/2015   Procedure: SIMPLE OPEN RETROPUBIC PROSTATECTOMY ;  Surgeon: Carolan Clines, MD;  Location: [facility];  Service: Urology;  Laterality: N/A;  . SHOULDER SURGERY  2002   right  . TONSILLECTOMY     HPI:  ERLING ARRAZOLA is an 84 y.o. male with a history of dementia who was sent from [facility] memory care with the chief complaint of fever. His memory care staff stated he seemed more weak and withdrawn than his baseline. There was no hypoxia or CXR infiltrates, Tmax 100.68F, found to be covid-positive with no elevation of inflammatory markers. Because the facility was unable to accept the patient back, despite clinical stability, he was brought to [place]. PT has recommended returning to previous level of care. This is pending facility bed availability which requires negative covid testing. The patient has continued to test positive on 7/24 and 7/27.. Had some trouble swallowing crushed meds in puree.    Assessment / Plan / Recommendation Clinical Impression  Pts mentation is severely impaired, he is minimally responsive other than fearful aggressive  responses to tactile stimulation. Unfortunately his oral mucosa is severely dry and coated, black and cracked. Repositioned pt and set up suction. Pt bit down hard on any object in his mouth. Using the yankauer and toothbrush able to gently brush pts teeth and gums and parts of his palate and tongue, improvement minimal. Pt did immediately chew ice but did not swallow; had to be very careful not to put a spoon fully in his mouth due to bite reponse. Pt not managing his secretions and wet coughing occurred often without sufficient expectoration. Could not achieve suction. Pt will need to remain NPO. ice would be benficial to oral hygiene, but may also result in increased aspiration events. Will follow for readiness.  SLP Visit Diagnosis: Dysphagia, oropharyngeal phase (R13.12)    Aspiration Risk  Severe aspiration risk;Risk for inadequate nutrition/hydration    Diet Recommendation NPO        Other  Recommendations Oral Care Recommendations: Oral care QID   Follow up Recommendations Skilled Nursing facility      Frequency and Duration min 2x/week  2 weeks       Prognosis Prognosis for Safe Diet Advancement: Fair      Swallow Study   General HPI: CHANDLOR NOECKER is an 84 y.o. male with a history of dementia who was sent from [facility] memory care with the chief complaint of fever. His memory care staff stated he seemed more weak and withdrawn than his baseline. There was no hypoxia  or CXR infiltrates, Tmax 100.71F, found to be covid-positive with no elevation of inflammatory markers. Because the facility was unable to accept the patient back, despite clinical stability, he was brought to [place]. PT has recommended returning to previous level of care. This is pending facility bed availability which requires negative covid testing. The patient has continued to test positive on 7/24 and 7/27.. Had some trouble swallowing crushed meds in puree.  Type of Study: Bedside Swallow Evaluation Diet  Prior to this Study: NPO Temperature Spikes Noted: No Respiratory Status: Room air History of Recent Intubation: No Behavior/Cognition: Lethargic/Drowsy;Uncooperative Oral Cavity Assessment: Dry;Dried secretions Oral Care Completed by SLP: Yes Oral Cavity - Dentition: Adequate natural dentition Vision: (kept eyes closed) Self-Feeding Abilities: Total assist Patient Positioning: Postural control interferes with function Baseline Vocal Quality: Wet Volitional Cough: Cognitively unable to elicit Volitional Swallow: Unable to elicit    Oral/Motor/Sensory Function Overall Oral Motor/Sensory Function: Other (comment)(bite response)   Ice Chips Ice chips: Impaired Presentation: Spoon Oral Phase Impairments: Reduced lingual movement/coordination Oral Phase Functional Implications: Oral holding;Oral residue;Prolonged oral transit Pharyngeal Phase Impairments: Cough - Immediate;Wet Vocal Quality   Thin Liquid Thin Liquid: Not tested    Nectar Thick Nectar Thick Liquid: Not tested   Honey Thick Honey Thick Liquid: Not tested   Puree Puree: Not tested   Solid     Solid: Not tested     Herbie Baltimore, MA CCC-SLP  Acute Rehabilitation Services Pager [phone] Office [phone]  Leyna Vanderkolk, Katherene Ponto 12/21/2018,2:39 PM

## 2018-12-21 NOTE — Progress Notes (Signed)
Meds crushed and put in applesauce.  Pt opened his mouth for the spoon, but then would not or could not swallow it.  Food/med mix had to be scraped out of his mouth by RN.

## 2018-12-22 LAB — CBC WITH DIFFERENTIAL/PLATELET
Abs Immature Granulocytes: 0.01 10*3/uL (ref 0.00–0.07)
Basophils Absolute: 0 10*3/uL (ref 0.0–0.1)
Basophils Relative: 0 %
Eosinophils Absolute: 0 10*3/uL (ref 0.0–0.5)
Eosinophils Relative: 0 %
HCT: 48.2 % (ref 39.0–52.0)
Hemoglobin: 15.3 g/dL (ref 13.0–17.0)
Immature Granulocytes: 0 %
Lymphocytes Relative: 6 %
Lymphs Abs: 0.3 10*3/uL — ABNORMAL LOW (ref 0.7–4.0)
MCH: 29.9 pg (ref 26.0–34.0)
MCHC: 31.7 g/dL (ref 30.0–36.0)
MCV: 94.3 fL (ref 80.0–100.0)
Monocytes Absolute: 0.2 10*3/uL (ref 0.1–1.0)
Monocytes Relative: 5 %
Neutro Abs: 4.4 10*3/uL (ref 1.7–7.7)
Neutrophils Relative %: 89 %
Platelets: 195 10*3/uL (ref 150–400)
RBC: 5.11 MIL/uL (ref 4.22–5.81)
RDW: 12.8 % (ref 11.5–15.5)
WBC: 4.9 10*3/uL (ref 4.0–10.5)
nRBC: 0 % (ref 0.0–0.2)

## 2018-12-22 LAB — COMPREHENSIVE METABOLIC PANEL
ALT: 23 U/L (ref 0–44)
AST: 29 U/L (ref 15–41)
Albumin: 3.5 g/dL (ref 3.5–5.0)
Alkaline Phosphatase: 55 U/L (ref 38–126)
Anion gap: 10 (ref 5–15)
BUN: 42 mg/dL — ABNORMAL HIGH (ref 8–23)
CO2: 25 mmol/L (ref 22–32)
Calcium: 11.7 mg/dL — ABNORMAL HIGH (ref 8.9–10.3)
Chloride: 113 mmol/L — ABNORMAL HIGH (ref 98–111)
Creatinine, Ser: 1.33 mg/dL — ABNORMAL HIGH (ref 0.61–1.24)
GFR calc Af Amer: 58 mL/min — ABNORMAL LOW (ref >60)
GFR calc non Af Amer: 50 mL/min — ABNORMAL LOW (ref >60)
Glucose, Bld: 205 mg/dL — ABNORMAL HIGH (ref 70–99)
Potassium: 4.5 mmol/L (ref 3.5–5.1)
Sodium: 148 mmol/L — ABNORMAL HIGH (ref 135–145)
Total Bilirubin: 0.9 mg/dL (ref 0.3–1.2)
Total Protein: 7.5 g/dL (ref 6.5–8.1)

## 2018-12-22 LAB — BASIC METABOLIC PANEL
Anion gap: 10 (ref 5–15)
BUN: 44 mg/dL — ABNORMAL HIGH (ref 8–23)
CO2: 28 mmol/L (ref 22–32)
Calcium: 12 mg/dL — ABNORMAL HIGH (ref 8.9–10.3)
Chloride: 112 mmol/L — ABNORMAL HIGH (ref 98–111)
Creatinine, Ser: 1.37 mg/dL — ABNORMAL HIGH (ref 0.61–1.24)
GFR calc Af Amer: 56 mL/min — ABNORMAL LOW (ref >60)
GFR calc non Af Amer: 48 mL/min — ABNORMAL LOW (ref >60)
Glucose, Bld: 183 mg/dL — ABNORMAL HIGH (ref 70–99)
Potassium: 4.3 mmol/L (ref 3.5–5.1)
Sodium: 150 mmol/L — ABNORMAL HIGH (ref 135–145)

## 2018-12-22 LAB — LACTIC ACID, PLASMA: Lactic Acid, Venous: 2.1 mmol/L (ref 0.5–1.9)

## 2018-12-22 LAB — D-DIMER, QUANTITATIVE: D-Dimer, Quant: 2.27 ug/mL-FEU — ABNORMAL HIGH (ref 0.00–0.50)

## 2018-12-22 LAB — MAGNESIUM: Magnesium: 2.6 mg/dL — ABNORMAL HIGH (ref 1.7–2.4)

## 2018-12-22 LAB — TSH: TSH: 0.495 u[IU]/mL (ref 0.350–4.500)

## 2018-12-22 LAB — C-REACTIVE PROTEIN: CRP: 14.7 mg/dL — ABNORMAL HIGH (ref <1.0)

## 2018-12-22 MED ORDER — SODIUM CHLORIDE 0.9 % IV SOLN
200.0000 mg | Freq: Once | INTRAVENOUS | Status: AC
  Administered 2018-12-22: 14:00:00 200 mg via INTRAVENOUS
  Filled 2018-12-22: qty 40

## 2018-12-22 MED ORDER — DEXTROSE 5 % IV BOLUS: 500.0000 mL | Freq: Once | INTRAVENOUS | Status: DC

## 2018-12-22 MED ORDER — SODIUM CHLORIDE 0.9 % IV SOLN
100.0000 mg | INTRAVENOUS | Status: DC
  Filled 2018-12-22: qty 20

## 2018-12-22 MED ORDER — DEXTROSE 5 % IV SOLN: INTRAVENOUS | Status: DC

## 2018-12-22 MED ORDER — ALBUTEROL SULFATE HFA 108 (90 BASE) MCG/ACT IN AERS
2.0000 | INHALATION_SPRAY | Freq: Four times a day (QID) | RESPIRATORY_TRACT | Status: DC | PRN
  Filled 2018-12-22: qty 6.7

## 2018-12-22 MED ORDER — ENOXAPARIN SODIUM 80 MG/0.8ML ~~LOC~~ SOLN
80.0000 mg | Freq: Two times a day (BID) | SUBCUTANEOUS | Status: DC
  Administered 2018-12-22 – 2018-12-23 (×3): 80 mg via SUBCUTANEOUS
  Filled 2018-12-22 (×3): qty 0.8

## 2018-12-22 NOTE — Progress Notes (Signed)
  Speech Language Pathology Treatment: Dysphagia  Patient Details Name: Raymond Hopkins MRN: [id number] DOB: 1935-03-12 Today's Date: 12/22/2018 Time: 0315-9458 SLP Time Calculation (min) (ACUTE ONLY): 76 min  Assessment / Plan / Recommendation Clinical Impression  Extensive session, SLP provided patient careful oral care, slowly gaining pts trust to allow tooth brush and suction into his mouth. Ice chips administered to good oral manipulation but inconsistent swallow response to help moisten dried secretions. Secretions did begin to mobilize, pt began swallowing more often, clearing some secretions, but never allowing pharyngeal suction. He also attempted to follow many commands and tried to participate. He startles so easily and needs to be told what you are doing and why you are doing it. He also likes to help hold objects. Feel that giving ice intermittently is the only way to moisten oral mucosa, help with secretion management and increase pts comfort. There is an increased risk of aspiration, but in this case the benefit outweighs the risk. Discussed options with pts wife who agrees that pt should be allowed to have ice.   When giving pt ice, sit upright, ask him if he wants ice and touch ice to lips, but do not put spoon fully into mouth as his reflex is to bite. He does chew ice well and should be given large enough quantity to elicit some good swallowing. Will f/u for tolerance.    HPI HPI: Raymond Hopkins is an 84 y.o. male with a history of dementia who was sent from [facility] memory care with the chief complaint of fever. His memory care staff stated he seemed more weak and withdrawn than his baseline. There was no hypoxia or CXR infiltrates, Tmax 100.83F, found to be covid-positive with no elevation of inflammatory markers. Because the facility was unable to accept the patient back, despite clinical stability, he was brought to [place]. PT has recommended returning to previous level of  care. This is pending facility bed availability which requires negative covid testing. The patient has continued to test positive on 7/24 and 7/27.. Had some trouble swallowing crushed meds in puree.       SLP Plan          Recommendations  Diet recommendations: NPO(ice chips) Liquids provided via: Teaspoon Medication Administration: Via alternative means                Oral Care Recommendations: Oral care QID Follow up Recommendations: Skilled Nursing facility SLP Visit Diagnosis: Dysphagia, oropharyngeal phase (R13.12)       GO                Raymond Hopkins, Raymond Hopkins 12/22/2018, 11:02 AM

## 2018-12-22 NOTE — Progress Notes (Addendum)
CRITICAL VALUE ALERT  Critical Value:  Lactic acid= 2.1  Date & Time Notied:  0355  Provider Notified: On call provider Olevia Bowens, MD  Orders Received/Actions taken: no new orders/awaiting callback   0407: MD called back, orders received.   0437: MD called, Pt has secretions in back of throat that staff is unable to suction 2/2 pt agitation; rhonchi noted throughout- previous order D/C'd.

## 2018-12-22 NOTE — Progress Notes (Signed)
Remdesivir - Pharmacy Brief Note   78 yoM has been in memory care unit since 09/2018 and admitted for fever.  Per MD notes, he is nonverbal and dementia has progressed rapidly this year.  Currently refusing PO meds and is NPO d/t aspiration concerns.  He has worsending AKI, hypernatremia, hypercalcemia, hyperchloremia, and hypermagnesemia despite hydration. Wife confirms to transition patient from full code to DNR/DNI.  She was also informed that if the patient's condition do not improve within 24 to 48 hours comfort care would be the best way to go which she agrees as well.  Today is day #11 of admission Afebrile ALT: 23 CXR: (7/30) Suspect mild bilateral pneumonia.  Mild left-sided volume loss SpO2: 92% on room air CRP increased to 14   A/P:  Remdesivir 200 mg once followed by 100 mg daily x 4 days.   Gretta Arab PharmD, BCPS Clinical pharmacist phone 7am- 5pm: [phone] 12/22/2018 9:53 AM

## 2018-12-22 NOTE — Progress Notes (Addendum)
PROGRESS NOTE  Brief Narrative: Raymond Hopkins is an 84 y.o. male with a history of dementia who was sent from [facility] memory care with the chief complaint of fever. His memory care staff stated he seemed more weak and withdrawn than his baseline. There was no hypoxia or CXR infiltrates, Tmax 100.70F, found to be covid-positive with no elevation of inflammatory markers. Because the facility was unable to accept the patient back, despite clinical stability, he was brought to [place]. PT has recommended returning to previous level of care. This is pending facility bed availability which requires negative covid testing. The patient has continued to test positive on 7/24 and 7/27.  Subjective:  Patient is in bed nonverbal in no distress but completely confused cannot answer questions or follow commands.  Objective:  BP 114/84 (BP Location: Left Arm)   Pulse 96   Temp 98.3 F (36.8 C) (Oral)   Resp 18   Ht 5\' 8"  (1.727 m)   SpO2 92%   BMI 26.46 kg/m    Awake but completely confused, No new F.N deficits,   [place].AT,PERRAL Supple Neck,No JVD, No cervical lymphadenopathy appriciated.  Symmetrical Chest wall movement, Good air movement bilaterally, CTAB RRR,No Gallops, Rubs or new Murmurs, No Parasternal Heave +ve B.Sounds, Abd Soft, No tenderness, No organomegaly appriciated, No rebound - guarding or rigidity. No Cyanosis, Clubbing or edema, No new Rash or bruise   CBC Latest Ref Rng & Units 12/22/2018 12/21/2018 12/12/2018  WBC 4.0 - 10.5 K/uL 4.9 5.4 2.1(L)  Hemoglobin 13.0 - 17.0 g/dL 15.3 16.3 13.3  Hematocrit 39.0 - 52.0 % 48.2 52.2(H) 41.6  Platelets 150 - 400 K/uL 195 179 168    BMP Latest Ref Rng & Units 12/22/2018 12/22/2018 12/21/2018  Glucose 70 - 99 mg/dL 183(H) 205(H) 175(H)  BUN 8 - 23 mg/dL 44(H) 42(H) 42(H)  Creatinine 0.61 - 1.24 mg/dL 1.37(H) 1.33(H) 1.36(H)  Sodium 135 - 145 mmol/L 150(H) 148(H) 149(H)  Potassium 3.5 - 5.1 mmol/L 4.3 4.5 4.1  Chloride 98 - 111 mmol/L  112(H) 113(H) 112(H)  CO2 22 - 32 mmol/L 28 25 25   Calcium 8.9 - 10.3 mg/dL 12.0(H) 11.7(H) 11.2(H)    CXR: hazy bilateral peripheral opacities  Recent Labs    12/21/18 0306 12/22/18 0155  DDIMER  --  2.27*  CRP 13.5* 14.7*   Assessment & Plan:  Covid-19 infection: Due to the patient age, he is at risk of developing severe disease - room air but at times dropping below 90% FiO2, chest x-ray is now positive, inflammatory markers are rising despite him being on steroids, will add Remdisvir on 12/22/2018.  Monitor closely.   Patient's current condition is concerning and anticipate poor prognosis down the road given worsening COVID infection.  This was communicated with the family.  COVID-19 Labs  Recent Labs    12/21/18 0306 12/22/18 0155  DDIMER  --  2.27*  CRP 13.5* 14.7*    Lab Results  Component Value Date   SARSCOV2NAA DETECTED (A) 12/18/2018   SARSCOV2NAA DETECTED (A) 12/15/2018   SARSCOV2NAA POSITIVE (A) 12/02/2018     1 of 2 blood culture bottles growing non-aureus staphylococcus. Strongly suspect contaminant.   Monitor fever curve and initiate Tx as indicated.  Dementia with behavioral disturbance: Has been in memory care since 10/11/2018 and has been experiencing a precipitous decline since that time. - Return to memory care unit once able. Continue home medications. - Delirium precautions, minimize restraints as much as possible. Patient is on Seroquel 50  mg nightly dose increased to 100 mg twice daily will reduce the dose to 50 mg twice daily. PRN IV Haldol .  Sinus bradycardia: Asymptomatic. BP has been normotensive.  Avoid AV nodal agents.   History of RLE DVT: - Continue xarelto 20mg  daily, Lovenox adjusted dose while n.p.o.  BPH:  - Monitor UOP, no retention noted.  Bladder scan as needed.  This morning bladder scan was 120 cc.  Continue home medications.  Osteoarthritis: - Tylenol, tramadol prn  GERD: - Continue PPI  Hypernatremia. Hypercalcemia.  Hyperchloremia - could be all related to dehydration as calcium levels were normal about 10 days ago, have increased IV fluids and will continue to hydrate, will also check PTH and PTH RP & TSH.  Note he does take vitamin D supplements at home which should be stopped.  Concern for aspiration.  Speech Therapy on board currently n.p.o., getting IV fluids.      Family communication.  Discussed with son who is a Software engineer with count and wife on 12/22/2018.  Plan is to reevaluate respiratory status tomorrow if worse than possible discharge with home hospice.  At that time Remdisvir can be stopped along with steroids.  If continues to improve then continue present line of care.  Remains DNR.   Time spent: 35 minutes  Signature  Lala Lund M.D on 12/22/2018 at 9:39 AM   -  To page go to [email or web address]

## 2018-12-22 NOTE — Evaluation (Addendum)
Physical Therapy Evaluation and Discharge (see also previous PT evaluation 12/14/18)  Patient Details Name: Raymond Hopkins MRN: [id number] DOB: 01/25/1935 Today's Date: 12/22/2018   History of Present Illness  Raymond Hopkins is an 84 y.o. male with a history of dementia who was sent from [facility] memory care with the chief complaint of fever. His memory care staff stated he seemed more weak and withdrawn than his baseline. There was no hypoxia or CXR infiltrates, Tmax 100.79F, found to be covid-positive with no elevation of inflammatory markers. Because the facility was unable to accept the patient back, despite clinical stability, he was brought to [place].  Clinical Impression   Patient evaluated again by PT as noted that he had a more positive response to SLP earlier today. Utilized techniques she recommended (calm tone, informing patient before attempting any movement, slow movements). Patient was restless, however not aggressive. He was lethargic, but opened eyes when spoken to. Assisted pt to reposition in the bed (move towards middle of mattress, move up to [facility]) and most movements he did not resist (he did resist any attempts to flex his legs at hip or knee). He did not follow commands during process. Unfortunately, this unfamiliar hospital setting and unfamiliar staff in PPE for airborne/droplet/contact precautions does not set patient up for success. Patient currently is not appropriate for skilled Physical Therapy as he is unable to participate due to his advanced dementia.      Follow Up Recommendations Other (comment);Supervision/Assistance - 24 hour(return to memory care when medically appropriate)    Equipment Recommendations  None recommended by PT    Recommendations for Other Services       Precautions / Restrictions Precautions Precautions: Fall;Other (comment) Precaution Comments: has had periods of agitation and aggressive behavior (hitting,  biting) Restrictions Weight Bearing Restrictions: No      Mobility  Bed Mobility Overal bed mobility: Needs Assistance Bed Mobility: (scoot laterally; scoot to [facility])           General bed mobility comments: pt lying diagonally in the bed with lower rt leg against lower bed rail; explained desire to get help him move into more comfortable position, pt did not resist moving his legs toward center of bed, attempted to pull against PTs hand to right his shoulders (?following command vs reflexive when grasped hand), unable to reach overhead to assist with scooting to [facility] and completed with total assist  Transfers                 General transfer comment: unable to attempt as pt resisted hip or knee flexion to try to even sit EOB  Ambulation/Gait                Stairs            Wheelchair Mobility    Modified Rankin (Stroke Patients Only)       Balance                                             Pertinent Vitals/Pain Pain Assessment: Faces Faces Pain Scale: Hurts a little bit Pain Location: slight grimace with scooting up in bed Pain Descriptors / Indicators: Grimacing Pain Intervention(s): Limited activity within patient's tolerance;Monitored during session;Repositioned    Home Living Family/patient expects to be discharged to:: Assisted living  Additional Comments: memory care    Prior Function Level of Independence: Needs assistance   Gait / Transfers Assistance Needed: chart indicates ambualtory in Memory care  ADL's / Homemaking Assistance Needed: suspect required assist         Hand Dominance        Extremity/Trunk Assessment   Upper Extremity Assessment Upper Extremity Assessment: Generalized weakness;RUE deficits/detail;LUE deficits/detail;Difficult to assess due to impaired cognition(bil shoulder limitations as attempted to have pt reach overh)    Lower Extremity Assessment Lower  Extremity Assessment: Difficult to assess due to impaired cognition(resisted hip or knee flexion)    Cervical / Trunk Assessment Cervical / Trunk Assessment: Kyphotic  Communication   Communication: Other (comment)(pt did not attempt to answer questions; mouth very dry)  Cognition Arousal/Alertness: Lethargic(partially opens eyes when spoken to; maintained x 30 sec ) Behavior During Therapy: Restless Overall Cognitive Status: No family/caregiver present to determine baseline cognitive functioning                                 General Comments: pt resides in memory care unit; unclear if this is his baseline      General Comments      Exercises     Assessment/Plan    PT Assessment Patent does not need any further PT services  PT Problem List         PT Treatment Interventions      PT Goals (Current goals can be found in the Care Plan section)  Acute Rehab PT Goals PT Goal Formulation: All assessment and education complete, DC therapy    Frequency (trial  for participation)   Barriers to discharge Other (comment)(memory care unit wants 2 negative tests prior to return)      Co-evaluation               AM-PAC PT "6 Clicks" Mobility  Outcome Measure Help needed turning from your back to your side while in a flat bed without using bedrails?: Total Help needed moving from lying on your back to sitting on the side of a flat bed without using bedrails?: Total Help needed moving to and from a bed to a chair (including a wheelchair)?: Total Help needed standing up from a chair using your arms (e.g., wheelchair or bedside chair)?: Total Help needed to walk in hospital room?: Total Help needed climbing 3-5 steps with a railing? : Total 6 Click Score: 6    End of Session   Activity Tolerance: Treatment limited secondary to medical complications (Comment)(dementia) Patient left: in bed;with call bell/phone within reach;with bed alarm set   PT Visit  Diagnosis: Muscle weakness (generalized) (M62.81)    Time: 6578-4696 PT Time Calculation (min) (ACUTE ONLY): 19 min   Charges:   PT Evaluation $PT Eval Low Complexity: 1 Low            Barry Brunner, PT      Rexanne Mano 12/22/2018, 4:59 PM

## 2018-12-22 NOTE — Progress Notes (Signed)
CRITICAL VALUE ALERT  Critical Value:  Lactic Acid 2.1  Date & Time Notied:  12/22/18 0352  Charge RN received result. Primary RN Nehemiah Settle notified.

## 2018-12-23 LAB — CBC WITH DIFFERENTIAL/PLATELET
Abs Immature Granulocytes: 0.01 10*3/uL (ref 0.00–0.07)
Basophils Absolute: 0 10*3/uL (ref 0.0–0.1)
Basophils Relative: 0 %
Eosinophils Absolute: 0 10*3/uL (ref 0.0–0.5)
Eosinophils Relative: 0 %
HCT: 46.8 % (ref 39.0–52.0)
Hemoglobin: 14.7 g/dL (ref 13.0–17.0)
Immature Granulocytes: 0 %
Lymphocytes Relative: 12 %
Lymphs Abs: 0.7 10*3/uL (ref 0.7–4.0)
MCH: 29.7 pg (ref 26.0–34.0)
MCHC: 31.4 g/dL (ref 30.0–36.0)
MCV: 94.5 fL (ref 80.0–100.0)
Monocytes Absolute: 0.6 10*3/uL (ref 0.1–1.0)
Monocytes Relative: 10 %
Neutro Abs: 4.5 10*3/uL (ref 1.7–7.7)
Neutrophils Relative %: 78 %
Platelets: 238 10*3/uL (ref 150–400)
RBC: 4.95 MIL/uL (ref 4.22–5.81)
RDW: 12.7 % (ref 11.5–15.5)
WBC: 5.8 10*3/uL (ref 4.0–10.5)
nRBC: 0 % (ref 0.0–0.2)

## 2018-12-23 LAB — COMPREHENSIVE METABOLIC PANEL
ALT: 26 U/L (ref 0–44)
AST: 36 U/L (ref 15–41)
Albumin: 3.4 g/dL — ABNORMAL LOW (ref 3.5–5.0)
Alkaline Phosphatase: 58 U/L (ref 38–126)
Anion gap: 14 (ref 5–15)
BUN: 45 mg/dL — ABNORMAL HIGH (ref 8–23)
CO2: 26 mmol/L (ref 22–32)
Calcium: 11.7 mg/dL — ABNORMAL HIGH (ref 8.9–10.3)
Chloride: 108 mmol/L (ref 98–111)
Creatinine, Ser: 1.3 mg/dL — ABNORMAL HIGH (ref 0.61–1.24)
GFR calc Af Amer: 59 mL/min — ABNORMAL LOW (ref >60)
GFR calc non Af Amer: 51 mL/min — ABNORMAL LOW (ref >60)
Glucose, Bld: 114 mg/dL — ABNORMAL HIGH (ref 70–99)
Potassium: 4.1 mmol/L (ref 3.5–5.1)
Sodium: 148 mmol/L — ABNORMAL HIGH (ref 135–145)
Total Bilirubin: 1 mg/dL (ref 0.3–1.2)
Total Protein: 7.4 g/dL (ref 6.5–8.1)

## 2018-12-23 LAB — LACTATE DEHYDROGENASE: LDH: 245 U/L — ABNORMAL HIGH (ref 98–192)

## 2018-12-23 LAB — MAGNESIUM: Magnesium: 2.2 mg/dL (ref 1.7–2.4)

## 2018-12-23 LAB — LACTIC ACID, PLASMA: Lactic Acid, Venous: 3.2 mmol/L (ref 0.5–1.9)

## 2018-12-23 LAB — FERRITIN: Ferritin: 442 ng/mL — ABNORMAL HIGH (ref 24–336)

## 2018-12-23 LAB — PARATHYROID HORMONE, INTACT (NO CA): PTH: 42 pg/mL (ref 15–65)

## 2018-12-23 LAB — BRAIN NATRIURETIC PEPTIDE: B Natriuretic Peptide: 52.9 pg/mL (ref 0.0–100.0)

## 2018-12-23 LAB — C-REACTIVE PROTEIN: CRP: 9.5 mg/dL — ABNORMAL HIGH (ref <1.0)

## 2018-12-23 LAB — D-DIMER, QUANTITATIVE: D-Dimer, Quant: 2.11 ug/mL-FEU — ABNORMAL HIGH (ref 0.00–0.50)

## 2018-12-23 MED ORDER — BIOTENE DRY MOUTH MT LIQD: 15.0000 mL | OROMUCOSAL | Status: DC | PRN

## 2018-12-23 MED ORDER — ACETAMINOPHEN 325 MG PO TABS: 650.0000 mg | ORAL_TABLET | Freq: Four times a day (QID) | ORAL | Status: DC | PRN

## 2018-12-23 MED ORDER — ONDANSETRON 4 MG PO TBDP: 4.0000 mg | ORAL_TABLET | Freq: Four times a day (QID) | ORAL | Status: DC | PRN

## 2018-12-23 MED ORDER — GLYCOPYRROLATE 0.2 MG/ML IJ SOLN: 0.2000 mg | INTRAMUSCULAR | Status: DC | PRN

## 2018-12-23 MED ORDER — MAGIC MOUTHWASH
15.0000 mL | Freq: Four times a day (QID) | ORAL | Status: DC | PRN
  Filled 2018-12-23: qty 15

## 2018-12-23 MED ORDER — LORAZEPAM 2 MG/ML PO CONC
1.0000 mg | ORAL | Status: DC | PRN
  Filled 2018-12-23: qty 0.5

## 2018-12-23 MED ORDER — ONDANSETRON HCL 4 MG/2ML IJ SOLN: 4.0000 mg | Freq: Four times a day (QID) | INTRAMUSCULAR | Status: DC | PRN

## 2018-12-23 MED ORDER — LORAZEPAM 0.5 MG PO TABS: 1.0000 mg | ORAL_TABLET | ORAL | Status: DC | PRN

## 2018-12-23 MED ORDER — HALOPERIDOL LACTATE 5 MG/ML IJ SOLN: 2.0000 mg | INTRAMUSCULAR | Status: DC | PRN

## 2018-12-23 MED ORDER — HALOPERIDOL 2 MG PO TABS
2.0000 mg | ORAL_TABLET | ORAL | Status: DC | PRN
  Filled 2018-12-23: qty 1

## 2018-12-23 MED ORDER — HALOPERIDOL LACTATE 2 MG/ML PO CONC
2.0000 mg | ORAL | Status: DC | PRN
  Filled 2018-12-23: qty 1

## 2018-12-23 MED ORDER — GLYCOPYRROLATE 1 MG PO TABS
1.0000 mg | ORAL_TABLET | ORAL | Status: DC | PRN
  Filled 2018-12-23: qty 1

## 2018-12-23 MED ORDER — ACETAMINOPHEN 650 MG RE SUPP
650.0000 mg | Freq: Four times a day (QID) | RECTAL | Status: DC | PRN
  Administered 2018-12-23 – 2018-12-24 (×2): 650 mg via RECTAL
  Filled 2018-12-23 (×2): qty 1

## 2018-12-23 MED ORDER — LORAZEPAM 2 MG/ML IJ SOLN
0.5000 mg | Freq: Three times a day (TID) | INTRAMUSCULAR | Status: DC
  Administered 2018-12-23 – 2018-12-24 (×3): 0.5 mg via INTRAVENOUS
  Filled 2018-12-23 (×3): qty 1

## 2018-12-23 MED ORDER — GLYCOPYRROLATE 0.2 MG/ML IJ SOLN
0.2000 mg | INTRAMUSCULAR | Status: DC | PRN
  Administered 2018-12-30: 0.2 mg via INTRAVENOUS
  Filled 2018-12-23: qty 1

## 2018-12-23 MED ORDER — OXYCODONE HCL 20 MG/ML PO CONC: 5.0000 mg | ORAL | Status: DC | PRN

## 2018-12-23 MED ORDER — DEXTROSE 5 % IV SOLN: INTRAVENOUS | Status: DC

## 2018-12-23 MED ORDER — LORAZEPAM 2 MG/ML IJ SOLN
1.0000 mg | INTRAMUSCULAR | Status: DC | PRN
  Filled 2018-12-23 (×2): qty 1

## 2018-12-23 MED ORDER — HYDROMORPHONE HCL 1 MG/ML IJ SOLN
0.5000 mg | INTRAMUSCULAR | Status: DC | PRN
  Administered 2018-12-26 – 2018-12-30 (×6): 0.5 mg via INTRAVENOUS
  Filled 2018-12-23 (×6): qty 0.5

## 2018-12-23 MED ORDER — ACETAMINOPHEN 650 MG RE SUPP: 650.0000 mg | Freq: Four times a day (QID) | RECTAL | Status: DC | PRN

## 2018-12-23 MED ORDER — LORAZEPAM 2 MG/ML IJ SOLN
1.0000 mg | INTRAMUSCULAR | Status: DC | PRN
  Administered 2018-12-24 – 2018-12-30 (×3): 1 mg via INTRAVENOUS
  Filled 2018-12-23: qty 1

## 2018-12-23 NOTE — Progress Notes (Addendum)
Update 10:41 Edy has decided to do home with hospice with [facility], preferably Monday or Tuesday for her to set up private pay 24/7 caregivers in the home.   CSW has consulted with patient's wife Edy regarding discharge options. She reports she is weighing her options of home with hospice through [place], or residential hospice. She inquired about [facility], [place] informed [facility] census is too high to take COVID patients right now. Only other option would be [facility] as they take COVID patients. She is concerned about visitation policy as there would need to be 5 family members visiting him at a time she reports.   Edy gave CSW permission to send referrals to [place] and [facility], both have been contacted and will reach out to patient's spouse Edy to discuss her options in detail.   [place], [place]

## 2018-12-23 NOTE — Plan of Care (Signed)
Pt now on comfort measures. VSS. Pt alert but still with incomprehensible speech, not following commands, restless. Q2 tunrs. No new skin issues noted. Pt remains NPO. IVF stopped. No prn's needed. New IV access. Now on scheduled ativan. Will continue plan of care.   Problem: Education: Goal: Knowledge of risk factors and measures for prevention of condition will improve Outcome: Not Progressing   Problem: Coping: Goal: Psychosocial and spiritual needs will be supported Outcome: Not Progressing   Problem: Respiratory: Goal: Will maintain a patent airway Outcome: Not Progressing Goal: Complications related to the disease process, condition or treatment will be avoided or minimized Outcome: Not Progressing   Problem: Education: Goal: Knowledge of General Education information will improve Description: Including pain rating scale, medication(s)/side effects and non-pharmacologic comfort measures Outcome: Not Progressing   Problem: Health Behavior/Discharge Planning: Goal: Ability to manage health-related needs will improve Outcome: Not Progressing   Problem: Clinical Measurements: Goal: Ability to maintain clinical measurements within normal limits will improve Outcome: Not Progressing Goal: Will remain free from infection Outcome: Not Progressing Goal: Diagnostic test results will improve Outcome: Not Progressing Goal: Respiratory complications will improve Outcome: Not Progressing Goal: Cardiovascular complication will be avoided Outcome: Not Progressing   Problem: Activity: Goal: Risk for activity intolerance will decrease Outcome: Not Progressing   Problem: Nutrition: Goal: Adequate nutrition will be maintained Outcome: Not Progressing   Problem: Coping: Goal: Level of anxiety will decrease Outcome: Not Progressing   Problem: Elimination: Goal: Will not experience complications related to bowel motility Outcome: Not Progressing Goal: Will not experience  complications related to urinary retention Outcome: Not Progressing   Problem: Pain Managment: Goal: General experience of comfort will improve Outcome: Not Progressing   Problem: Safety: Goal: Ability to remain free from injury will improve Outcome: Not Progressing   Problem: Skin Integrity: Goal: Risk for impaired skin integrity will decrease Outcome: Not Progressing

## 2018-12-23 NOTE — Progress Notes (Signed)
Pt has elevated temp. (see vitals). Dr. Myna Hidalgo paged and made aware. Awaiting orders

## 2018-12-23 NOTE — Progress Notes (Signed)
Pt combative and attempting to pull out IV line which is currently infusing continuous fluids. IV line was wrapped and disguised but patient continuously attempts to pull wrap and IV line. Mittens placed on the patient hands.

## 2018-12-23 NOTE — Progress Notes (Signed)
Posey Pronto, MD notified of pt lactic acid of 3.2 via secure chat. Message seen. No orders received

## 2018-12-23 NOTE — Progress Notes (Signed)
[facility]) Hospice  Received request from [place] for family interest in [place] services at home after discharge. Chart and patient information reviewed and spoke with spouse Edythe phone. [facility] physician and reviewed and  eligibility has been confirmed.   Spoke with spouse by phone to explain services including COVID-19 restrictions, answer questions, offer support. Spouse verbalized understanding of information given. Spouse explained their situation and requested hospice services at home so family members can be present. Per discussion, plan is for discharge to home by PTAR once family has arranged 24/7 care in the home. Spouse was thinking Monday or Tuesday at the time of this call.   DME discussed. Spouse requested hospital bed, over bed table and BSC to be delivered to home address which is correct as address on face sheet. DME ordered from [place]. Spouse Edythe is contact to coordinate DME delivery to home.    Please send signed and completed DNR form home with patient/family.   Patient will need prescriptions for discharge comfort medications.  [facility] aware of the above.  Completed discharge summary will need to be faxed to [facility] at [phone] when final.  Please notify [facility] when patient is ready to leave the unit at discharge. (Call [phone] between 8:30 and 5pm. Call [phone] after 5pm.)  [facility] information and contact numbers given to spouse during this call. Above information shared with CSW Caryl Pina.  Please call with hospice related questions.  Thank you for this referral. Heloise Purpura [phone] [employer]   [place] are listed on AMION under Hospice and [place].

## 2018-12-23 NOTE — Progress Notes (Signed)
[place] CMO Discussed remdesivir with pharmacy, goals of care not clear Will d/c order for today and discuss with primary team Roselie Awkward, MD [place] [facility] Pager: [phone] Cell: [phone] If no response, call [phone]

## 2018-12-23 NOTE — Progress Notes (Signed)
PROGRESS NOTE  Brief Narrative: Raymond Hopkins is an 84 y.o. male with a history of dementia who was sent from [facility] memory care with the chief complaint of fever. His memory care staff stated he seemed more weak and withdrawn than his baseline. There was no hypoxia or CXR infiltrates, Tmax 100.15F, found to be covid-positive with no elevation of inflammatory markers. Because the facility was unable to accept the patient back, despite clinical stability, he was brought to [place]. PT has recommended returning to previous level of care. This is pending facility bed availability which requires negative covid testing. The patient has continued to test positive on 7/24 and 7/27.  Subjective: Remains nonverbal but although he is able to open up his eyes on verbal cues.  Continues to not follow any commands.  No nausea no vomiting.  Remains agitated.  Objective: BP 126/70 (BP Location: Left Arm)   Pulse 88   Temp 97.9 F (36.6 C) (Axillary)   Resp 16   Ht 5\' 8"  (1.727 m)   SpO2 92%   BMI 26.46 kg/m   General: lethargic and not oriented to time, place, and person. Appear in moderate distress, affect unresponsive Eyes: PERRL, Conjunctiva normal ENT: Oral Mucosa shows Dry gray discoloration  Neck: difficult to assess  JVD, no Abnormal Mass Or lumps Cardiovascular: S1 and S2 Present, aortic systolic Murmur, peripheral pulses symmetrical Respiratory: normal respiratory effort, Bilateral Air entry equal and Decreased, no use of accessory muscle, basal Crackles, no wheezes Abdomen: Bowel Sound present, Soft and difficult to assess  tenderness, no hernia Skin: no rashes  Extremities: no Pedal edema, no calf tenderness Neurologic: Awake on verbal cue.  Not following any commands.  Confused. Gait not checked due to patient safety concerns   CBC Latest Ref Rng & Units 12/23/2018 12/22/2018 12/21/2018  WBC 4.0 - 10.5 K/uL 5.8 4.9 5.4  Hemoglobin 13.0 - 17.0 g/dL 14.7 15.3 16.3  Hematocrit 39.0 -  52.0 % 46.8 48.2 52.2(H)  Platelets 150 - 400 K/uL 238 195 179    BMP Latest Ref Rng & Units 12/23/2018 12/22/2018 12/22/2018  Glucose 70 - 99 mg/dL 114(H) 183(H) 205(H)  BUN 8 - 23 mg/dL 45(H) 44(H) 42(H)  Creatinine 0.61 - 1.24 mg/dL 1.30(H) 1.37(H) 1.33(H)  Sodium 135 - 145 mmol/L 148(H) 150(H) 148(H)  Potassium 3.5 - 5.1 mmol/L 4.1 4.3 4.5  Chloride 98 - 111 mmol/L 108 112(H) 113(H)  CO2 22 - 32 mmol/L 26 28 25   Calcium 8.9 - 10.3 mg/dL 11.7(H) 12.0(H) 11.7(H)     Lab Results  Component Value Date   SARSCOV2NAA DETECTED (A) 12/18/2018   SARSCOV2NAA DETECTED (A) 12/15/2018   SARSCOV2NAA POSITIVE (A) 12/21/2018   CXR: hazy bilateral peripheral opacities  Recent Labs    12/21/18 0306 12/22/18 0155 12/23/18 0333  DDIMER  --  2.27* 2.11*  FERRITIN  --   --  442*  LDH  --   --  245*  CRP 13.5* 14.7* 9.5*   Assessment & Plan: Principal Problem:   COVID-19 virus infection Active Problems:   GERD (gastroesophageal reflux disease)   Arthritis   BPH (benign prostatic hyperplasia)   Acute deep vein thrombosis (DVT) of distal end of right lower extremity ([place])   Bilateral pulmonary embolism (HCC)   Dementia (HCC)   History of DVT (deep vein thrombosis)   Pneumonia due to COVID-19 virus  Covid-19 infection: Due to the patient age, he is at risk of developing severe disease, Now becoming hypoxic and tachycardic.  CRP elevated from 0.08-13.8. Chest x-ray on 12/21/2018 also shows bilateral pneumonia. Started on IV Decadron and Remdesivir. Condition not improving and therefore now transition to comfort care. Monitor oxygenation Patient's current condition is concerning and anticipate poor prognosis down the road given worsening COVID infection.  This was communicated with the family.  1 of 2 blood culture bottles growing non-aureus staphylococcus. Strongly suspect contaminant.  - Monitor fever curve and initiate Tx as indicated.  Dementia with behavioral disturbance: Has been in  memory care since 10/11/2018 and has been experiencing a precipitous decline since that time. - Return to memory care unit once able. Continue home medications.  - Delirium precautions, minimize restraints as much as possible. -Now comfort care.  Will use scheduled Ativan as needed Ativan as needed 6 Haldol. PRN Haldol dose increased.  Sinus bradycardia: Asymptomatic. BP has been normotensive.  - Avoid AV nodal agents.   History of RLE DVT:  -Discontinue anticoagulation comfort care.  BPH:  - Monitor UOP, no retention noted.  Bladder scan as needed. - Continue home medications  Osteoarthritis:  - Tylenol, tramadol prn  GERD:  -Comfort care  Hypernatremia. Hypercalcemia. Hyperchloremia Treated with IV fluids.  Lactic acid continues to elevate.  Renal function improved.  Some improvement in the labs.  Now comfort care as remains n.p.o. despite improvement in labs.  Concern for aspiration. Continue n.p.o. Speech therapy consulted.  Goals of care discussion. Had an extensive discussion with patient's spouse on 12/21/2018.  And 12/23/2018.  Son was also on conference call. Patient had progressed rapidly regarding his dementia this year. Currently significantly agitated requiring medication to calm him down. Minimal p.o. intake which has led to acute kidney injury as well as hyponatremia as well as hypercalcemia. Despite hydration condition is worsening. Speech therapy consulted and patient remains at risk for aspiration. With an ongoing active COVID-19 infection which appears to be worsening with CRP trending from 0.08-13 as well as chest x-ray showing bilateral pneumonia as well as hypoxia I am concerned that the patient's prognosis remains significantly poor. Reportedly patient was in the process of completing a most form with his PCP. Patient's spouse who is the healthcare power of attorney was informed regarding above situation. She was informed that the patient's prognosis  remains poor and going through CPR or ventilation may not be in patient's best interest given his current mentation as well as COVID-19 infection. She currently agrees and confirms to transition patient from full code to DNR/DNI. While there is some improvement in patient's CRP clinically the patient does not look any different.  Continues to remain n.p.o. unable to swallow anything.  No purposeful movement no purposeful communication.  I am concerned that the patient's prognosis will be poor with or without COVID.  This was communicated with the family who verbalized understanding and agreed with the recommendation of transitioning to complete comfort.  Eventual goal is to go home with hospice.  Will monitor for 4 to 48 hours to see if the patient is on hospice appropriate or not.  At present with his agitation requirement for close monitoring and IV medication I do not think currently the patient may be home hospice appropriate.  With no p.o. intake prognosis remains less than 2 weeks.   Time spent: 35 minutes  Berle Mull, MD Pager [phone] 12/23/2018, 5:41 PM

## 2018-12-23 DEATH — deceased

## 2018-12-24 MED ORDER — LORAZEPAM 2 MG/ML IJ SOLN
0.5000 mg | Freq: Three times a day (TID) | INTRAMUSCULAR | Status: DC
  Administered 2018-12-24 – 2018-12-30 (×17): 0.5 mg via INTRAVENOUS
  Filled 2018-12-24 (×17): qty 1

## 2018-12-24 MED ORDER — LORAZEPAM 2 MG/ML PO CONC
1.0000 mg | Freq: Three times a day (TID) | ORAL | Status: DC
  Administered 2018-12-24: 1 mg via ORAL
  Filled 2018-12-24: qty 1

## 2018-12-24 NOTE — Plan of Care (Signed)
Pt remains on comfort measures. Son came to visit pt. Pt on scheduled ativan. Hand mitts remain in place. Condom cath in place. Bladder scan performed on pt towards middle of shift. MD notified. Pt voided once during shift. MD aware. No signs of pain. Pt turned Q2 hours. No new skin issues noted. VSS. Pt remains on RA. Pt now on regular diet but during dinner pt refused to open mouth. Falls precautions in place. Will continue plan of care    Problem: Education: Goal: Knowledge of risk factors and measures for prevention of condition will improve Outcome: Not Progressing   Problem: Coping: Goal: Psychosocial and spiritual needs will be supported Outcome: Not Progressing   Problem: Respiratory: Goal: Will maintain a patent airway Outcome: Not Progressing Goal: Complications related to the disease process, condition or treatment will be avoided or minimized Outcome: Not Progressing   Problem: Education: Goal: Knowledge of General Education information will improve Description: Including pain rating scale, medication(s)/side effects and non-pharmacologic comfort measures Outcome: Not Progressing   Problem: Health Behavior/Discharge Planning: Goal: Ability to manage health-related needs will improve Outcome: Not Progressing   Problem: Clinical Measurements: Goal: Ability to maintain clinical measurements within normal limits will improve Outcome: Not Progressing Goal: Will remain free from infection Outcome: Not Progressing Goal: Diagnostic test results will improve Outcome: Not Progressing Goal: Respiratory complications will improve Outcome: Not Progressing Goal: Cardiovascular complication will be avoided Outcome: Not Progressing   Problem: Activity: Goal: Risk for activity intolerance will decrease Outcome: Not Progressing   Problem: Nutrition: Goal: Adequate nutrition will be maintained Outcome: Not Progressing   Problem: Coping: Goal: Level of anxiety will  decrease Outcome: Not Progressing   Problem: Elimination: Goal: Will not experience complications related to bowel motility Outcome: Not Progressing Goal: Will not experience complications related to urinary retention Outcome: Not Progressing   Problem: Pain Managment: Goal: General experience of comfort will improve Outcome: Not Progressing   Problem: Safety: Goal: Ability to remain free from injury will improve Outcome: Not Progressing   Problem: Skin Integrity: Goal: Risk for impaired skin integrity will decrease Outcome: Not Progressing

## 2018-12-24 NOTE — Progress Notes (Signed)
Patient's wife called and updated. Family made aware pt is on comfort care and is able to have one visitor. Wife would like to visit patient in the morning. Wife given charge nurse and main number to call back in order to attempt to visit patient.

## 2018-12-24 NOTE — Progress Notes (Signed)
PROGRESS NOTE  Brief Narrative: Raymond Hopkins is an 84 y.o. male with a history of dementia who was sent from [facility] memory care with the chief complaint of fever. His memory care staff stated he seemed more weak and withdrawn than his baseline. There was no hypoxia or CXR infiltrates, Tmax 100.52F, found to be covid-positive with no elevation of inflammatory markers. Because the facility was unable to accept the patient back, despite clinical stability, he was brought to [place]. PT has recommended returning to previous level of care. This is pending facility bed availability which requires negative covid testing. The patient has continued to test positive on 7/24 and 7/27.  Subjective: Occasionally agitated.  Not following any commands.  No purposeful movement.  Not opening of his eyes.  No nausea no vomiting.  No intake at all.  No output.  Objective: BP 98/69 (BP Location: Left Arm)   Pulse 95   Temp (!) 97.2 F (36.2 C) (Axillary)   Resp 14   Ht 5\' 8"  (1.727 m)   SpO2 90%   BMI 26.46 kg/m   General: lethargic and not oriented to time, place, and person. Appear in mild distress, affect unresponsive Eyes: PERRL, Conjunctiva normal ENT: Oral Mucosa shows Dry gray discoloration  Neck: difficult to assess  JVD, no Abnormal Mass Or lumps Cardiovascular: S1 and S2 Present, aortic systolic Murmur, peripheral pulses symmetrical Respiratory: normal respiratory effort, Bilateral Air entry equal and Decreased, no use of accessory muscle, basal Crackles, no wheezes Abdomen: Bowel Sound present, Soft and difficult to assess  tenderness, no hernia Skin: no rashes  Extremities: no Pedal edema, no calf tenderness Neurologic: Awake on verbal cue.  Not following any commands.  Confused. Gait not checked due to patient safety concerns   CBC Latest Ref Rng & Units 12/23/2018 12/22/2018 12/21/2018  WBC 4.0 - 10.5 K/uL 5.8 4.9 5.4  Hemoglobin 13.0 - 17.0 g/dL 14.7 15.3 16.3  Hematocrit 39.0 - 52.0 %  46.8 48.2 52.2(H)  Platelets 150 - 400 K/uL 238 195 179    BMP Latest Ref Rng & Units 12/23/2018 12/22/2018 12/22/2018  Glucose 70 - 99 mg/dL 114(H) 183(H) 205(H)  BUN 8 - 23 mg/dL 45(H) 44(H) 42(H)  Creatinine 0.61 - 1.24 mg/dL 1.30(H) 1.37(H) 1.33(H)  Sodium 135 - 145 mmol/L 148(H) 150(H) 148(H)  Potassium 3.5 - 5.1 mmol/L 4.1 4.3 4.5  Chloride 98 - 111 mmol/L 108 112(H) 113(H)  CO2 22 - 32 mmol/L 26 28 25   Calcium 8.9 - 10.3 mg/dL 11.7(H) 12.0(H) 11.7(H)     Lab Results  Component Value Date   SARSCOV2NAA DETECTED (A) 12/18/2018   SARSCOV2NAA DETECTED (A) 12/15/2018   SARSCOV2NAA POSITIVE (A) 12/01/2018   CXR: hazy bilateral peripheral opacities  Recent Labs    12/22/18 0155 12/23/18 0333  DDIMER 2.27* 2.11*  FERRITIN  --  442*  LDH  --  245*  CRP 14.7* 9.5*   Assessment & Plan: Principal Problem:   COVID-19 virus infection Active Problems:   GERD (gastroesophageal reflux disease)   Arthritis   BPH (benign prostatic hyperplasia)   Acute deep vein thrombosis (DVT) of distal end of right lower extremity ([place])   Bilateral pulmonary embolism (HCC)   Dementia (HCC)   History of DVT (deep vein thrombosis)   Pneumonia due to COVID-19 virus  Covid-19 infection: Due to the patient age, he is at risk of developing severe disease, Now becoming hypoxic and tachycardic.  CRP elevated from 0.08-13.8. Chest x-ray on 12/21/2018 also shows bilateral  pneumonia. Started on IV Decadron and Remdesivir. Condition not improving and therefore now transition to comfort care. Monitor oxygenation Patient's current condition is concerning and anticipate poor prognosis down the road given worsening COVID infection.  This was communicated with the family.  1 of 2 blood culture bottles growing non-aureus staphylococcus.  suspect contaminant.    Dementia with behavioral disturbance: Has been in memory care since 10/11/2018 and has been experiencing a precipitous decline since that time. -  Return to memory care unit once able. Continue home medications.  - Delirium precautions, minimize restraints as much as possible. -Now comfort care.  Will use scheduled Ativan as needed Ativan as needed 6 Haldol. PRN Haldol dose increased.  Sinus bradycardia: Asymptomatic. BP has been normotensive.  - Avoid AV nodal agents.   History of RLE DVT:  -Discontinue anticoagulation comfort care.  BPH:  - Monitor UOP, no retention noted.  Bladder scan as needed. - Continue home medications  Osteoarthritis:  - Tylenol, tramadol prn  GERD:  -Comfort care  Hypernatremia. Hypercalcemia. Hyperchloremia Treated with IV fluids.  Lactic acid continues to elevate.  Renal function improved.  Some improvement in the labs.  Now comfort care as remains n.p.o. despite improvement in labs.  Concern for aspiration. Continue n.p.o. Speech therapy consulted.  Goals of care discussion. Had an extensive discussion with patient's spouse on 12/21/2018.  And 12/23/2018.  Son was also on conference call. Patient had progressed rapidly regarding his dementia this year. Currently significantly agitated requiring medication to calm him down. Minimal p.o. intake which has led to acute kidney injury as well as hyponatremia as well as hypercalcemia. Despite hydration condition is worsening. Speech therapy consulted and patient remains at risk for aspiration. With an ongoing active COVID-19 infection which appears to be worsening with CRP trending from 0.08-13 as well as chest x-ray showing bilateral pneumonia as well as hypoxia I am concerned that the patient's prognosis remains significantly poor. Reportedly patient was in the process of completing a most form with his PCP. Patient's spouse who is the healthcare power of attorney was informed regarding above situation. She was informed that the patient's prognosis remains poor and going through CPR or ventilation may not be in patient's best interest given his  current mentation as well as COVID-19 infection. She currently agrees and confirms to transition patient from full code to DNR/DNI. While there is some improvement in patient's CRP clinically the patient does not look any different.  Continues to remain n.p.o. unable to swallow anything.  No purposeful movement no purposeful communication.  I am concerned that the patient's prognosis will be poor with or without COVID.  This was communicated with the family who verbalized understanding and agreed with the recommendation of transitioning to complete comfort.  Eventual goal is to go with hospice.   Patient remains agitated.  Requiring IV Ativan.  Unable to tolerate p.o. Ativan due to severe cough secondary to aspiration as well as bite response. Prognosis less than 2 weeks. Recommend inpatient hospice.  Time spent: 35 minutes  Berle Mull, MD Pager [phone] 12/24/2018, 5:06 PM

## 2018-12-24 NOTE — Progress Notes (Signed)
Pt wife called for update from MD. MD notified via secure text. Message seen and MD responded.

## 2018-12-25 ENCOUNTER — Ambulatory Visit: Payer: Medicare HMO | Admitting: Podiatry

## 2018-12-25 MED ORDER — HYDROMORPHONE HCL 1 MG/ML IJ SOLN
0.5000 mg | Freq: Two times a day (BID) | INTRAMUSCULAR | Status: DC
  Administered 2018-12-25 – 2018-12-30 (×11): 0.5 mg via INTRAVENOUS
  Filled 2018-12-25 (×11): qty 0.5

## 2018-12-25 NOTE — Plan of Care (Signed)
  Problem: Respiratory: Goal: Will maintain a patent airway Outcome: Progressing   Problem: Education: Goal: Knowledge of risk factors and measures for prevention of condition will improve Outcome: Not Progressing   Problem: Coping: Goal: Psychosocial and spiritual needs will be supported Outcome: Not Progressing   Problem: Respiratory: Goal: Complications related to the disease process, condition or treatment will be avoided or minimized Outcome: Not Progressing   Problem: Education: Goal: Knowledge of General Education information will improve Description: Including pain rating scale, medication(s)/side effects and non-pharmacologic comfort measures Outcome: Not Progressing   Problem: Health Behavior/Discharge Planning: Goal: Ability to manage health-related needs will improve Outcome: Not Progressing

## 2018-12-25 NOTE — Progress Notes (Signed)
PROGRESS NOTE  Brief Narrative: Raymond Hopkins is an 84 y.o. male with a history of dementia who was sent from [facility] memory care with the chief complaint of fever. His memory care staff stated he seemed more weak and withdrawn than his baseline. There was no hypoxia or CXR infiltrates, Tmax 100.46F, found to be covid-positive with no elevation of inflammatory markers. Because the facility was unable to accept the patient back, despite clinical stability, he was brought to [facility]. PT has recommended returning to previous level of care. This is pending facility bed availability which requires negative covid testing. The patient has continued to test positive on 7/24 and 7/27.  Subjective: Appears in discomfort.  Nasal flaring.  Abdominal breathing.  No nausea no vomiting.  No acute events overnight.  Voiding.  Objective: BP 111/80 (BP Location: Right Arm)   Pulse 84   Temp (!) 97.5 F (36.4 C) (Axillary)   Resp 12   Ht 5\' 8"  (1.727 m)   SpO2 92%   BMI 26.46 kg/m   General: lethargic and not oriented to time, place, and person. Appear in moderate distress, affect unresponsive Eyes: PERRL, Conjunctiva normal ENT: Oral Mucosa shows Dry gray discoloration  Neck: difficult to assess  JVD, no Abnormal Mass Or lumps Cardiovascular: S1 and S2 Present, aortic systolic Murmur, peripheral pulses symmetrical Respiratory: normal respiratory effort, Bilateral Air entry equal and Decreased, no use of accessory muscle, basal Crackles, no wheezes Abdomen: Bowel Sound present, Soft and difficult to assess  tenderness, no hernia Skin: no rashes  Extremities: no Pedal edema, no calf tenderness Neurologic: Awake on verbal cue.  Not following any commands.  Confused. Gait not checked due to patient safety concerns   CBC Latest Ref Rng & Units 12/23/2018 12/22/2018 12/21/2018  WBC 4.0 - 10.5 K/uL 5.8 4.9 5.4  Hemoglobin 13.0 - 17.0 g/dL 14.7 15.3 16.3  Hematocrit 39.0 - 52.0 % 46.8 48.2 52.2(H)   Platelets 150 - 400 K/uL 238 195 179    BMP Latest Ref Rng & Units 12/23/2018 12/22/2018 12/22/2018  Glucose 70 - 99 mg/dL 114(H) 183(H) 205(H)  BUN 8 - 23 mg/dL 45(H) 44(H) 42(H)  Creatinine 0.61 - 1.24 mg/dL 1.30(H) 1.37(H) 1.33(H)  Sodium 135 - 145 mmol/L 148(H) 150(H) 148(H)  Potassium 3.5 - 5.1 mmol/L 4.1 4.3 4.5  Chloride 98 - 111 mmol/L 108 112(H) 113(H)  CO2 22 - 32 mmol/L 26 28 25   Calcium 8.9 - 10.3 mg/dL 11.7(H) 12.0(H) 11.7(H)     Lab Results  Component Value Date   SARSCOV2NAA DETECTED (A) 12/18/2018   SARSCOV2NAA DETECTED (A) 12/15/2018   SARSCOV2NAA POSITIVE (A) 12/11/2018   CXR: hazy bilateral peripheral opacities  Recent Labs    12/23/18 0333  DDIMER 2.11*  FERRITIN 442*  LDH 245*  CRP 9.5*   Assessment & Plan: Principal Problem:   COVID-19 virus infection Active Problems:   GERD (gastroesophageal reflux disease)   Arthritis   BPH (benign prostatic hyperplasia)   Acute deep vein thrombosis (DVT) of distal end of right lower extremity ([place])   Bilateral pulmonary embolism (HCC)   Dementia (HCC)   History of DVT (deep vein thrombosis)   Pneumonia due to COVID-19 virus  Covid-19 infection: Due to the patient age, he is at risk of developing severe disease, Now becoming hypoxic and tachycardic.  CRP elevated from 0.08-13.8. Chest x-ray on 12/21/2018 also shows bilateral pneumonia. Started on IV Decadron and Remdesivir. Condition not improving and therefore now transition to comfort care. Monitor oxygenation  Patient's current condition is concerning and anticipate poor prognosis down the road given worsening COVID infection.  This was communicated with the family.  1 of 2 blood culture bottles growing non-aureus staphylococcus.  suspect contaminant.    Dementia with behavioral disturbance: Has been in memory care since 10/11/2018 and has been experiencing a precipitous decline since that time. - Return to memory care unit once able. Continue home  medications.  - Delirium precautions, minimize restraints as much as possible. -Now comfort care.  Will use scheduled Ativan as needed Ativan as needed 6 Haldol. PRN Haldol dose increased. Will add scheduled Dilaudid 0.5 twice daily along with as needed support breathing.  Sinus bradycardia: Asymptomatic. BP has been normotensive.  - Avoid AV nodal agents.   History of RLE DVT:  -Discontinue anticoagulation comfort care.  BPH:  - Monitor UOP, no retention noted.  Bladder scan as needed. - Continue home medications  Osteoarthritis:  - Tylenol, tramadol prn  GERD:  -Comfort care  Hypernatremia. Hypercalcemia. Hyperchloremia Treated with IV fluids.  Lactic acid continues to elevate.  Renal function improved.  Some improvement in the labs.  Now comfort care as remains n.p.o. despite improvement in labs.  Concern for aspiration. Continue n.p.o. Speech therapy consulted.  Goals of care discussion. Had an extensive discussion with patient's spouse on 12/21/2018.  And 12/23/2018.  Son was also on conference call. Patient had progressed rapidly regarding his dementia this year. Currently significantly agitated requiring medication to calm him down. Minimal p.o. intake which has led to acute kidney injury as well as hyponatremia as well as hypercalcemia. Despite hydration condition is worsening. Speech therapy consulted and patient remains at risk for aspiration. With an ongoing active COVID-19 infection which appears to be worsening with CRP trending from 0.08-13 as well as chest x-ray showing bilateral pneumonia as well as hypoxia I am concerned that the patient's prognosis remains significantly poor. Reportedly patient was in the process of completing a most form with his PCP. Patient's spouse who is the healthcare power of attorney was informed regarding above situation. She was informed that the patient's prognosis remains poor and going through CPR or ventilation may not be in  patient's best interest given his current mentation as well as COVID-19 infection. She currently agrees and confirms to transition patient from full code to DNR/DNI. While there is some improvement in patient's CRP clinically the patient does not look any different.  Continues to remain n.p.o. unable to swallow anything.  No purposeful movement no purposeful communication.  I am concerned that the patient's prognosis will be poor with or without COVID.  This was communicated with the family who verbalized understanding and agreed with the recommendation of transitioning to complete comfort.  Eventual goal is to go with hospice.   Patient remains agitated.  Requiring IV Ativan.  Unable to tolerate p.o. Ativan due to severe cough secondary to aspiration as well as bite response. Prognosis less than 2 weeks. Recommend inpatient hospice.  Time spent: 35 minutes  Berle Mull, MD Pager [phone] 12/25/2018, 5:41 PM

## 2018-12-26 NOTE — Progress Notes (Signed)
[facility] Progress Note  Patient: Raymond Hopkins HAL:[id number]   PCP: Wenda Low, MD DOB: Nov 25, 1934   DOA: 12/16/2018   DOS: 12/26/2018   Date of Service: the patient was seen and examined on 12/26/2018  Brief hospital course: Raymond Hopkins is an 84 y.o. male with a history of dementia who was sent from [facility] memory care with the chief complaint of fever. His memory care staff stated he seemed more weak and withdrawn than his baseline. There was no hypoxia or CXR infiltrates, Tmax 100.46F, found to be covid-positive with no elevation of inflammatory markers. Because the facility was unable to accept the patient back, despite clinical stability, he was brought to [facility]. PT has recommended returning to previous level of care. This is pending facility bed availability which requires negative covid testing. The patient has continued to test positive on 7/24 and 7/27. Currently further plan is continue comfort care and finding residential hospice.  Subjective: Appears comfortable.  No acute events overnight.  Assessment and Plan: Covid-19 infection: Due to the patient age, he is at risk of developing severe disease, Now becoming hypoxic and tachycardic.  CRP elevated from 0.08-13.8. Chest x-ray on 12/21/2018 also shows bilateral pneumonia. Started on IV Decadron and Remdesivir. Condition not improving and therefore now transition to comfort care. Monitor oxygenation Patient's current condition is concerning and anticipate poor prognosis down the road given worsening COVID infection.  This was communicated with the family.  1 of 2 blood culture bottles growing non-aureus staphylococcus.  suspect contaminant.    Dementia with behavioral disturbance: Has been in memory care since 10/11/2018 and has been experiencing a precipitous decline since that time. - Return to memory care unit once able. Continue home medications.  - Delirium precautions, minimize restraints as much as  possible. -Now comfort care.  Will use scheduled Ativan as needed Ativan as needed 6 Haldol. PRN Haldol dose increased. Will add scheduled Dilaudid 0.5 twice daily along with as needed support breathing.  Sinus bradycardia: Asymptomatic. BP has been normotensive.  - Avoid AV nodal agents.   History of RLE DVT:  -Discontinue anticoagulation comfort care.  BPH:  - Monitor UOP, no retention noted.  Bladder scan as needed. - Continue home medications  Osteoarthritis:  - Tylenol, tramadol prn  GERD:  -Comfort care  Hypernatremia. Hypercalcemia. Hyperchloremia Treated with IV fluids.  Lactic acid continues to elevate.  Renal function improved.  Some improvement in the labs.  Now comfort care as remains n.p.o. despite improvement in labs.  Concern for aspiration. Continue n.p.o. Speech therapy consulted.  Goals of care discussion. Had an extensive discussion with patient's spouse on 12/21/2018.  And 12/23/2018.  Son was also on conference call. Patient had progressed rapidly regarding his dementia this year. Currently significantly agitated requiring medication to calm him down. Minimal p.o. intake which has led to acute kidney injury as well as hyponatremia as well as hypercalcemia. Despite hydration condition is worsening. Speech therapy consulted and patient remains at risk for aspiration. With an ongoing active COVID-19 infection which appears to be worsening with CRP trending from 0.08-13 as well as chest x-ray showing bilateral pneumonia as well as hypoxia I am concerned that the patient's prognosis remains significantly poor. Reportedly patient was in the process of completing a most form with his PCP. Patient's spouse who is the healthcare power of attorney was informed regarding above situation. She was informed that the patient's prognosis remains poor and going through CPR or ventilation may not be in patient's  best interest given his current mentation as well as  COVID-19 infection. She currently agrees and confirms to transition patient from full code to DNR/DNI. While there is some improvement in patient's CRP clinically the patient does not look any different.  Continues to remain n.p.o. unable to swallow anything.  No purposeful movement no purposeful communication.  I am concerned that the patient's prognosis will be poor with or without COVID.  This was communicated with the family who verbalized understanding and agreed with the recommendation of transitioning to complete comfort.  Eventual goal is to go with hospice.   Patient remains agitated.  Requiring IV Ativan.  Unable to tolerate p.o. Ativan due to severe cough secondary to aspiration as well as bite response. Prognosis less than 2 weeks. Recommend inpatient hospice.  Diet: Regular diet DVT Prophylaxis: comfort care  Advance goals of care discussion: comfort care  Family Communication: no family was present at bedside, at the time of interview.  Updated wife on 12/26/2018.  Disposition:  Discharge to Home .  Consultants: NONE Procedures: NONE  Scheduled Meds: . dexamethasone (DECADRON) injection  6 mg Intravenous Daily  .  HYDROmorphone (DILAUDID) injection  0.5 mg Intravenous BID  . Ipratropium-Albuterol  1 puff Inhalation Q6H  . LORazepam  0.5 mg Intravenous TID  . mouth rinse  15 mL Mouth Rinse q12n4p  . QUEtiapine  50 mg Oral BID  . triamcinolone cream  1 application Topical BID   Continuous Infusions: PRN Meds: acetaminophen, acetaminophen **OR** acetaminophen, albuterol, antiseptic oral rinse, chlorpheniramine-HYDROcodone, glycopyrrolate **OR** glycopyrrolate **OR** glycopyrrolate, guaiFENesin-dextromethorphan, haloperidol **OR** haloperidol **OR** haloperidol lactate, HYDROmorphone (DILAUDID) injection, LORazepam **OR** LORazepam **OR** LORazepam, LORazepam, magic mouthwash, ondansetron **OR** ondansetron (ZOFRAN) IV, oxyCODONE **OR** oxyCODONE, phenol, polyethylene glycol  Antibiotics: Anti-infectives (From admission, onward)   Start     Dose/Rate Route Frequency Ordered Stop   12/23/18 1300  remdesivir 100 mg in sodium chloride 0.9 % 250 mL IVPB  Status:  Discontinued     100 mg 500 mL/hr over 30 Minutes Intravenous Every 24 hours 12/22/18 1157 12/23/18 0701   12/22/18 1300  remdesivir 200 mg in sodium chloride 0.9 % 250 mL IVPB     200 mg 500 mL/hr over 30 Minutes Intravenous Once 12/22/18 1157 12/22/18 1458       Objective: Physical Exam: Vitals:   12/25/18 0800 12/25/18 1944 12/26/18 0426 12/26/18 0811  BP:  (!) 134/105 (!) 137/103 110/68  Pulse:  (!) 104 88 73  Resp:  16 19   Temp: (!) 97.5 F (36.4 C) 97.6 F (36.4 C) 98.1 F (36.7 C) (!) 97.5 F (36.4 C)  TempSrc: Axillary Axillary Axillary Axillary  SpO2:  95% 95% 95%  Height:        Intake/Output Summary (Last 24 hours) at 12/26/2018 1756 Last data filed at 12/26/2018 0400 Gross per 24 hour  Intake -  Output 950 ml  Net -950 ml   There were no vitals filed for this visit. General: obtunded and not oriented to time, place, and person. Appear in mild distress, affect unresponsive Eyes: PERRL, Neck: difficult to assess  JVD, no Abnormal Mass Or lumps Cardiovascular: S1 and S2 Present, no Murmur, peripheral pulses symmetrical Respiratory: no  respiratory effort, Bilateral Air entry equal and Decreased, no use of accessory muscle, bilateral Crackles, no wheezes Abdomen: Bowel Sound present, Soft and no tenderness, no hernia Skin: no rashes  Extremities: no Pedal edema,  Data Reviewed: CBC: Recent Labs  Lab 12/21/18 0306 12/22/18 0155 12/23/18 4580  WBC 5.4 4.9 5.8  NEUTROABS 4.3 4.4 4.5  HGB 16.3 15.3 14.7  HCT 52.2* 48.2 46.8  MCV 95.6 94.3 94.5  PLT 179 195 093   Basic Metabolic Panel: Recent Labs  Lab 12/21/18 1455 12/21/18 2005 12/22/18 0155 12/22/18 0725 12/23/18 0333  NA 152* 149* 148* 150* 148*  K 4.8 4.1 4.5 4.3 4.1  CL 115* 112* 113* 112* 108  CO2 25 25  25 28 26   GLUCOSE 169* 175* 205* 183* 114*  BUN 44* 42* 42* 44* 45*  CREATININE 1.54* 1.36* 1.33* 1.37* 1.30*  CALCIUM 12.0* 11.2* 11.7* 12.0* 11.7*  MG  --   --  2.6*  --  2.2    Liver Function Tests: Recent Labs  Lab 12/21/18 0306 12/22/18 0155 12/23/18 0333  AST 28 29 36  ALT 27 23 26   ALKPHOS 56 55 58  BILITOT 0.9 0.9 1.0  PROT 8.2* 7.5 7.4  ALBUMIN 3.9 3.5 3.4*   No results for input(s): LIPASE, AMYLASE in the last 168 hours. No results for input(s): AMMONIA in the last 168 hours. Coagulation Profile: No results for input(s): INR, PROTIME in the last 168 hours. Cardiac Enzymes: No results for input(s): CKTOTAL, CKMB, CKMBINDEX, TROPONINI in the last 168 hours. BNP (last 3 results) No results for input(s): PROBNP in the last 8760 hours. CBG: No results for input(s): GLUCAP in the last 168 hours. Studies: No results found.   Time spent: 35 minutes  Author: Berle Mull, MD [facility] 12/26/2018 5:56 PM  To reach On-call, see care teams to locate the attending and reach out to them via [email or web address]. If 7PM-7AM, please contact night-coverage If you still have difficulty reaching the attending provider, please page the [facility] (Director on Call) for [facility] on amion for assistance.

## 2018-12-26 NOTE — Progress Notes (Addendum)
Nutrition Brief Note  Chart reviewed. Patient is receiving comfort care. Patient is not tolerating any POs. No further nutrition interventions warranted at this time.  Please re-consult as needed.   Molli Barrows, RD, LDN, [place] Pager [phone] After Hours Pager [phone]

## 2018-12-26 NOTE — Plan of Care (Signed)
  Problem: Clinical Measurements: Goal: Ability to maintain clinical measurements within normal limits will improve Outcome: Progressing Goal: Will remain free from infection Outcome: Progressing   Problem: Education: Goal: Knowledge of risk factors and measures for prevention of condition will improve Outcome: Not Progressing   Problem: Coping: Goal: Psychosocial and spiritual needs will be supported Outcome: Not Progressing   Problem: Respiratory: Goal: Will maintain a patent airway Outcome: Not Progressing Goal: Complications related to the disease process, condition or treatment will be avoided or minimized Outcome: Not Progressing   Problem: Education: Goal: Knowledge of General Education information will improve Description: Including pain rating scale, medication(s)/side effects and non-pharmacologic comfort measures Outcome: Not Progressing   Problem: Health Behavior/Discharge Planning: Goal: Ability to manage health-related needs will improve Outcome: Not Progressing

## 2018-12-27 MED ORDER — HYDROMORPHONE HCL 1 MG/ML PO LIQD: 1.0000 mg | ORAL | Status: DC | PRN

## 2018-12-27 MED ORDER — HYDROMORPHONE HCL 2 MG PO TABS: 1.0000 mg | ORAL_TABLET | ORAL | Status: DC | PRN

## 2018-12-27 MED ORDER — LIP MEDEX EX OINT
TOPICAL_OINTMENT | CUTANEOUS | Status: DC | PRN
  Administered 2018-12-28 (×2): via TOPICAL
  Filled 2018-12-27: qty 7

## 2018-12-27 NOTE — Progress Notes (Addendum)
[facility] Progress Note  Patient: Raymond Hopkins YHC:[id number]   PCP: Wenda Low, MD DOB: 11/24/1934   DOA: 11/28/2018   DOS: 12/27/2018   Date of Service: the patient was seen and examined on 12/27/2018  Brief hospital course:  Raymond Hopkins is an 84 y.o. male with a history of dementia who was sent from [facility] memory care with the chief complaint of fever. His memory care staff stated he seemed more weak and withdrawn than his baseline. There was no hypoxia or CXR infiltrates, Tmax 100.69F, found to be covid-positive with no elevation of inflammatory markers. Because the facility was unable to accept the patient back, despite clinical stability, he was brought to [place]. PT has recommended returning to previous level of care. This is pending facility bed availability which requires negative covid testing. The patient has continued to test positive on 7/24 and 7/27. Currently further plan is continue comfort care and finding residential hospice.   Subjective: Patient in bed visibly confused, now on full comfort measures.   Assessment and Plan:  Covid-19 infection: Due to the patient age, he is at risk of developing severe disease, Now becoming hypoxic and tachycardic.  CRP elevated from 0.08-13.8. Chest x-ray on 12/21/2018 also shows bilateral pneumonia. Started on IV Decadron and Remdesivir. Condition not improving and therefore now transition to comfort care. Monitor oxygenation Patient's current condition is concerning and anticipate poor prognosis down the road given worsening COVID infection.  This was communicated with the family.  They have now decided to pursue full comfort care with placement to residential hospice when available.  Goal of care is now comfort only.   1 of 2 blood culture bottles growing non-aureus staphylococcus. suspect contaminant.    Dementia without behavioral disturbance: Has been in memory care since 10/11/2018 and has been experiencing  a precipitous decline since that time. - Return to memory care unit once able. Continue home medications.  - Delirium precautions, minimize restraints as much as possible. -Now comfort care.  Will use scheduled Ativan as needed Ativan as needed 6 Haldol. PRN Haldol dose increased. Will add scheduled Dilaudid 0.5 twice daily along with as needed support breathing.  Sinus bradycardia: Asymptomatic. BP has been normotensive.  Avoid AV nodal agents.   History of RLE DVT:  -Discontinued anticoagulation comfort care.  BPH:  - Monitor UOP, no retention noted.  Bladder scan as needed.  Continue home medications  Osteoarthritis: - Tylenol, tramadol prn  GERD: -Comfort care  Concern for aspiration. - switched from regular diet to soft diet with feeding assistance aspiration precautions.       Diet: Soft diet DVT Prophylaxis: comfort care  Advance goals of care discussion: comfort care  Family Communication:   Updated again by me 12/27/18. Family to visit 12/28/18 at 1pm, if some PO Meds can be given they might plan on taking him home later in the week.   Disposition:   SNF with comfort care or to residential hospice  Consultants: NONE Procedures: NONE  Scheduled Meds: . dexamethasone (DECADRON) injection  6 mg Intravenous Daily  .  HYDROmorphone (DILAUDID) injection  0.5 mg Intravenous BID  . Ipratropium-Albuterol  1 puff Inhalation Q6H  . LORazepam  0.5 mg Intravenous TID  . mouth rinse  15 mL Mouth Rinse q12n4p  . QUEtiapine  50 mg Oral BID  . triamcinolone cream  1 application Topical BID   Continuous Infusions: PRN Meds: acetaminophen, acetaminophen **OR** acetaminophen, albuterol, antiseptic oral rinse, chlorpheniramine-HYDROcodone, glycopyrrolate **OR** glycopyrrolate **OR** glycopyrrolate,  guaiFENesin-dextromethorphan, haloperidol **OR** haloperidol **OR** haloperidol lactate, HYDROmorphone (DILAUDID) injection, LORazepam **OR** LORazepam **OR** LORazepam, LORazepam,  magic mouthwash, ondansetron **OR** ondansetron (ZOFRAN) IV, oxyCODONE **OR** oxyCODONE, phenol, polyethylene glycol Antibiotics: Anti-infectives (From admission, onward)   Start     Dose/Rate Route Frequency Ordered Stop   12/23/18 1300  remdesivir 100 mg in sodium chloride 0.9 % 250 mL IVPB  Status:  Discontinued     100 mg 500 mL/hr over 30 Minutes Intravenous Every 24 hours 12/22/18 1157 12/23/18 0701   12/22/18 1300  remdesivir 200 mg in sodium chloride 0.9 % 250 mL IVPB     200 mg 500 mL/hr over 30 Minutes Intravenous Once 12/22/18 1157 12/22/18 1458       Objective: Physical Exam: Vitals:   12/26/18 0426 12/26/18 0811 12/26/18 1920 12/27/18 0832  BP: (!) 137/103 110/68 112/67   Pulse: 88 73    Resp: 19  20 (!) 24  Temp: 98.1 F (36.7 C) (!) 97.5 F (36.4 C) 97.8 F (36.6 C) 98.7 F (37.1 C)  TempSrc: Axillary Axillary Axillary Axillary  SpO2: 95% 95% 98%   Height:        Intake/Output Summary (Last 24 hours) at 12/27/2018 1000 Last data filed at 12/27/2018 0200 Gross per 24 hour  Intake -  Output 600 ml  Net -600 ml    Awake but thoroughly confused wearing mittens, moving all 4 extremities, unable to cooperate in exam [place].AT,PERRAL Supple Neck,No JVD, No cervical lymphadenopathy appriciated.  Symmetrical Chest wall movement, Good air movement bilaterally, CTAB RRR,No Gallops, Rubs or new Murmurs, No Parasternal Heave +ve B.Sounds, Abd Soft, No tenderness, No organomegaly appriciated, No rebound - guarding or rigidity. No Cyanosis, Clubbing or edema, No new Rash or bruise   Data Reviewed: CBC: Recent Labs  Lab 12/21/18 0306 12/22/18 0155 12/23/18 0333  WBC 5.4 4.9 5.8  NEUTROABS 4.3 4.4 4.5  HGB 16.3 15.3 14.7  HCT 52.2* 48.2 46.8  MCV 95.6 94.3 94.5  PLT 179 195 710   Basic Metabolic Panel: Recent Labs  Lab 12/21/18 1455 12/21/18 2005 12/22/18 0155 12/22/18 0725 12/23/18 0333  NA 152* 149* 148* 150* 148*  K 4.8 4.1 4.5 4.3 4.1  CL 115* 112*  113* 112* 108  CO2 25 25 25 28 26   GLUCOSE 169* 175* 205* 183* 114*  BUN 44* 42* 42* 44* 45*  CREATININE 1.54* 1.36* 1.33* 1.37* 1.30*  CALCIUM 12.0* 11.2* 11.7* 12.0* 11.7*  MG  --   --  2.6*  --  2.2    Liver Function Tests: Recent Labs  Lab 12/21/18 0306 12/22/18 0155 12/23/18 0333  AST 28 29 36  ALT 27 23 26   ALKPHOS 56 55 58  BILITOT 0.9 0.9 1.0  PROT 8.2* 7.5 7.4  ALBUMIN 3.9 3.5 3.4*   No results for input(s): LIPASE, AMYLASE in the last 168 hours. No results for input(s): AMMONIA in the last 168 hours. Coagulation Profile: No results for input(s): INR, PROTIME in the last 168 hours. Cardiac Enzymes: No results for input(s): CKTOTAL, CKMB, CKMBINDEX, TROPONINI in the last 168 hours. BNP (last 3 results) No results for input(s): PROBNP in the last 8760 hours. CBG: No results for input(s): GLUCAP in the last 168 hours. Studies: No results found.   Time spent: 35 minutes  Author:  Imogene Burn M.D on 12/27/2018 at 10:02 AM   -  To page go to [email or web address]

## 2018-12-27 NOTE — Progress Notes (Signed)
Family updated, they are anxious for RaymondHopkins to be discharged to hospice

## 2018-12-28 NOTE — TOC Progression Note (Signed)
Transition of Care [facility]) - Progression Note    Patient Details  Name: Raymond Hopkins MRN: [id number] Date of Birth: Aug 13, 1934  Transition of Care [facility]) CM/SW [place], LCSW Phone Number: 12/28/2018, 9:21 AM  Clinical Narrative:      CSW spoke with patients spouse, Amedeo Kinsman, regarding discharge plans. There was talk about family wanting patient to go home with hospice however spouse states they have decided that they do not that they will be able to manage patient at home and want to continue seeking residential hospice.   CSW will reach out to [facility] and [facility] this AM to determine when beds will be available.     Expected Discharge Plan: [place] Barriers to Discharge: Hospice Bed not available  Expected Discharge Plan and Services Expected Discharge Plan: [employer]                                               Social Determinants of Health (SDOH) Interventions    Readmission Risk Interventions No flowsheet data found.

## 2018-12-28 NOTE — Progress Notes (Signed)
[facility] Progress Note  Patient: SELASSIE SPATAFORE AVW:[id number]   PCP: Wenda Low, MD DOB: 06-28-1934   DOA: 12/07/2018   DOS: 12/28/2018   Date of Service: the patient was seen and examined on 12/28/2018  Brief hospital course:  CORBITT CLOKE is an 84 y.o. male with a history of dementia who was sent from [facility] memory care with the chief complaint of fever. His memory care staff stated he seemed more weak and withdrawn than his baseline. There was no hypoxia or CXR infiltrates, Tmax 100.53F, found to be covid-positive with no elevation of inflammatory markers. Because the facility was unable to accept the patient back, despite clinical stability, he was brought to [facility]. PT has recommended returning to previous level of care. This is pending facility bed availability which requires negative covid testing. The patient has continued to test positive on 7/24 and 7/27. Currently further plan is continue comfort care and finding residential hospice.   Subjective: Patient in bed today much calmer, appears weaker than before, appears to be in no discomfort.   Assessment and Plan:  Covid-19 infection: Due to the patient age, he is at risk of developing severe disease, Now becoming hypoxic and tachycardic.  CRP elevated from 0.08-13.8. Chest x-ray on 12/21/2018 also shows bilateral pneumonia. Started on IV Decadron and Remdesivir. Condition not improving and therefore now transition to comfort care. Monitor oxygenation Patient's current condition is concerning and anticipate poor prognosis down the road given worsening COVID infection.  This was communicated with the family.  They have now decided to pursue full comfort care with placement to residential hospice when available.  Goal of care is now comfort only.   1 of 2 blood culture bottles growing non-aureus staphylococcus. suspect contaminant.    Dementia without behavioral disturbance: Has been in memory care since  10/11/2018 and has been experiencing a precipitous decline since that time. - Return to memory care unit once able. Continue home medications.  - Delirium precautions, minimize restraints as much as possible. -Now comfort care.  Will use scheduled Ativan as needed Ativan as needed 6 Haldol. PRN Haldol dose increased. Will add scheduled Dilaudid 0.5 twice daily along with as needed support breathing.  Sinus bradycardia: Asymptomatic. BP has been normotensive.  Avoid AV nodal agents.   History of RLE DVT:  -Discontinued anticoagulation comfort care.  BPH:  - Monitor UOP, no retention noted.  Bladder scan as needed.  Continue home medications  Osteoarthritis: - Tylenol, tramadol prn  GERD: -Comfort care  Concern for aspiration. - switched from regular diet to soft diet with feeding assistance aspiration precautions.       Diet: Soft diet DVT Prophylaxis: comfort care  Advance goals of care discussion: comfort care  Family Communication:   Updated again by me on 12/28/2018 they will visit the patient today, on 12/28/2018 he appears to be closer to passing away, they will evaluate today whether they want to take him home tomorrow or not.  Residential hospice placement still pending.   Disposition:   SNF with comfort care or to residential hospice  Consultants: NONE Procedures: NONE  Scheduled Meds: .  HYDROmorphone (DILAUDID) injection  0.5 mg Intravenous BID  . Ipratropium-Albuterol  1 puff Inhalation Q6H  . LORazepam  0.5 mg Intravenous TID  . mouth rinse  15 mL Mouth Rinse q12n4p  . QUEtiapine  50 mg Oral BID  . triamcinolone cream  1 application Topical BID   Continuous Infusions: PRN Meds: acetaminophen, albuterol, antiseptic oral rinse,  chlorpheniramine-HYDROcodone, glycopyrrolate **OR** glycopyrrolate **OR** glycopyrrolate, haloperidol **OR** haloperidol **OR** haloperidol lactate, HYDROmorphone (DILAUDID) injection, HYDROmorphone, lip balm, LORazepam **OR**  LORazepam **OR** LORazepam, LORazepam, magic mouthwash, ondansetron **OR** ondansetron (ZOFRAN) IV, oxyCODONE **OR** oxyCODONE, phenol, polyethylene glycol Antibiotics: Anti-infectives (From admission, onward)   Start     Dose/Rate Route Frequency Ordered Stop   12/23/18 1300  remdesivir 100 mg in sodium chloride 0.9 % 250 mL IVPB  Status:  Discontinued     100 mg 500 mL/hr over 30 Minutes Intravenous Every 24 hours 12/22/18 1157 12/23/18 0701   12/22/18 1300  remdesivir 200 mg in sodium chloride 0.9 % 250 mL IVPB     200 mg 500 mL/hr over 30 Minutes Intravenous Once 12/22/18 1157 12/22/18 1458       Objective: Physical Exam: Vitals:   12/27/18 2030 12/28/18 0000 12/28/18 0600 12/28/18 0700  BP: 115/82   99/70  Pulse: (!) 118 (!) 117 (!) 108   Resp:      Temp: (!) 97.4 F (36.3 C)   98.4 F (36.9 C)  TempSrc: Axillary     SpO2: 91% 92% 92%   Height:        Intake/Output Summary (Last 24 hours) at 12/28/2018 0905 Last data filed at 12/28/2018 0400 Gross per 24 hour  Intake 0 ml  Output 650 ml  Net -650 ml    Somnolent today, unable to answer questions or follow commands, remains calm and appears to be in no distress [place].AT,PERRAL Supple Neck,No JVD, No cervical lymphadenopathy appriciated.  Symmetrical Chest wall movement, Good air movement bilaterally, CTAB RRR,No Gallops, Rubs or new Murmurs, No Parasternal Heave +ve B.Sounds, Abd Soft, No tenderness, No organomegaly appriciated, No rebound - guarding or rigidity. No Cyanosis, Clubbing or edema, No new Rash or bruise    Data Reviewed: CBC: Recent Labs  Lab 12/22/18 0155 12/23/18 0333  WBC 4.9 5.8  NEUTROABS 4.4 4.5  HGB 15.3 14.7  HCT 48.2 46.8  MCV 94.3 94.5  PLT 195 607   Basic Metabolic Panel: Recent Labs  Lab 12/21/18 1455 12/21/18 2005 12/22/18 0155 12/22/18 0725 12/23/18 0333  NA 152* 149* 148* 150* 148*  K 4.8 4.1 4.5 4.3 4.1  CL 115* 112* 113* 112* 108  CO2 25 25 25 28 26   GLUCOSE 169* 175*  205* 183* 114*  BUN 44* 42* 42* 44* 45*  CREATININE 1.54* 1.36* 1.33* 1.37* 1.30*  CALCIUM 12.0* 11.2* 11.7* 12.0* 11.7*  MG  --   --  2.6*  --  2.2    Liver Function Tests: Recent Labs  Lab 12/22/18 0155 12/23/18 0333  AST 29 36  ALT 23 26  ALKPHOS 55 58  BILITOT 0.9 1.0  PROT 7.5 7.4  ALBUMIN 3.5 3.4*   No results for input(s): LIPASE, AMYLASE in the last 168 hours. No results for input(s): AMMONIA in the last 168 hours. Coagulation Profile: No results for input(s): INR, PROTIME in the last 168 hours. Cardiac Enzymes: No results for input(s): CKTOTAL, CKMB, CKMBINDEX, TROPONINI in the last 168 hours. BNP (last 3 results) No results for input(s): PROBNP in the last 8760 hours. CBG: No results for input(s): GLUCAP in the last 168 hours. Studies: No results found.   Time spent: 35 minutes  Author:  Imogene Burn M.D on 12/28/2018 at 9:05 AM   -  To page go to [email or web address]

## 2018-12-29 NOTE — Progress Notes (Signed)
[facility] Progress Note  Patient: Raymond Hopkins [id number]:[id number]   PCP: Wenda Low, MD DOB: 05-04-1935   DOA: 12/16/2018   DOS: 12/29/2018   Date of Service: the patient was seen and examined on 12/29/2018  Brief hospital course:  LAMONTE HARTT is an 84 y.o. male with a history of dementia who was sent from [facility] memory care with the chief complaint of fever. His memory care staff stated he seemed more weak and withdrawn than his baseline. There was no hypoxia or CXR infiltrates, Tmax 100.23F, found to be covid-positive with no elevation of inflammatory markers. Because the facility was unable to accept the patient back, despite clinical stability, he was brought to [facility]. PT has recommended returning to previous level of care. This is pending facility bed availability which requires negative covid testing. The patient has continued to test positive on 7/24 and 7/27. Currently further plan is continue comfort care and finding residential hospice.   Subjective: Patient in bed today much calmer, appears weaker than before, appears to be in no discomfort.   Assessment and Plan:  Covid-19 infection: Due to the patient age, he is at risk of developing severe disease, Now becoming hypoxic and tachycardic.  CRP elevated from 0.08-13.8. Chest x-ray on 12/21/2018 also shows bilateral pneumonia. Started on IV Decadron and Remdesivir. Condition not improving and therefore now transition to comfort care. Monitor oxygenation Patient's current condition is concerning and anticipate poor prognosis down the road given worsening COVID infection.  This was communicated with the family.  They have now decided to pursue full comfort care with placement to residential hospice when available.  Goal of care is now comfort only.   1 of 2 blood culture bottles growing non-aureus staphylococcus. suspect contaminant.    Dementia without behavioral disturbance: Has been in memory care since  10/11/2018 and has been experiencing a precipitous decline since that time. - Return to memory care unit once able. Continue home medications.  - Delirium precautions, minimize restraints as much as possible. -Now comfort care.  Will use scheduled Ativan as needed Ativan as needed 6 Haldol. PRN Haldol dose increased. Will add scheduled Dilaudid 0.5 twice daily along with as needed support breathing.  Sinus bradycardia: Asymptomatic. BP has been normotensive.  Avoid AV nodal agents.   History of RLE DVT:  -Discontinued anticoagulation comfort care.  BPH:  - Monitor UOP, no retention noted.  Bladder scan as needed.  Continue home medications  Osteoarthritis: - Tylenol, tramadol prn  GERD: -Comfort care  Concern for aspiration. - switched from regular diet to soft diet with feeding assistance aspiration precautions.       Diet: Soft diet DVT Prophylaxis: comfort care  Advance goals of care discussion: comfort care  Family Communication:   Updated again by me on 12/29/2018 they visited the patient on 12/28/2018 and decided that he should either stay here or go to residential hospice, they understand that he is going to pass away soon and are not interested in taking him home as they will not be able to provide him full care.   Disposition:   SNF with comfort care or to residential hospice not home.  Consultants: NONE Procedures: NONE  Scheduled Meds: .  HYDROmorphone (DILAUDID) injection  0.5 mg Intravenous BID  . Ipratropium-Albuterol  1 puff Inhalation Q6H  . LORazepam  0.5 mg Intravenous TID  . mouth rinse  15 mL Mouth Rinse q12n4p  . QUEtiapine  50 mg Oral BID  . triamcinolone cream  1  application Topical BID   Continuous Infusions: PRN Meds: acetaminophen, albuterol, antiseptic oral rinse, chlorpheniramine-HYDROcodone, glycopyrrolate **OR** glycopyrrolate **OR** glycopyrrolate, haloperidol **OR** haloperidol **OR** haloperidol lactate, HYDROmorphone (DILAUDID)  injection, HYDROmorphone, lip balm, LORazepam **OR** LORazepam **OR** LORazepam, LORazepam, magic mouthwash, ondansetron **OR** ondansetron (ZOFRAN) IV, oxyCODONE **OR** oxyCODONE, phenol, polyethylene glycol Antibiotics: Anti-infectives (From admission, onward)   Start     Dose/Rate Route Frequency Ordered Stop   12/23/18 1300  remdesivir 100 mg in sodium chloride 0.9 % 250 mL IVPB  Status:  Discontinued     100 mg 500 mL/hr over 30 Minutes Intravenous Every 24 hours 12/22/18 1157 12/23/18 0701   12/22/18 1300  remdesivir 200 mg in sodium chloride 0.9 % 250 mL IVPB     200 mg 500 mL/hr over 30 Minutes Intravenous Once 12/22/18 1157 12/22/18 1458       Objective: Physical Exam: Vitals:   12/29/18 0200 12/29/18 0430 12/29/18 0546 12/29/18 0749  BP:   103/78 (!) 124/51  Pulse: (!) 112 (!) 104  100  Resp:    (!) 22  Temp:   97.7 F (36.5 C) 97.8 F (36.6 C)  TempSrc:   Oral Axillary  SpO2: 94% 94%  94%  Height:        Intake/Output Summary (Last 24 hours) at 12/29/2018 0950 Last data filed at 12/29/2018 0553 Gross per 24 hour  Intake 0 ml  Output 200 ml  Net -200 ml    Somnolent today, unable to answer questions or follow commands, remains calm and appears to be in no distress [place].AT,PERRAL Supple Neck,No JVD, No cervical lymphadenopathy appriciated.  Symmetrical Chest wall movement, Good air movement bilaterally, CTAB RRR,No Gallops, Rubs or new Murmurs, No Parasternal Heave +ve B.Sounds, Abd Soft, No tenderness, No organomegaly appriciated, No rebound - guarding or rigidity. No Cyanosis, Clubbing or edema, No new Rash or bruise    Data Reviewed: CBC: Recent Labs  Lab 12/23/18 0333  WBC 5.8  NEUTROABS 4.5  HGB 14.7  HCT 46.8  MCV 94.5  PLT 147   Basic Metabolic Panel: Recent Labs  Lab 12/23/18 0333  NA 148*  K 4.1  CL 108  CO2 26  GLUCOSE 114*  BUN 45*  CREATININE 1.30*  CALCIUM 11.7*  MG 2.2    Liver Function Tests: Recent Labs  Lab 12/23/18 0333   AST 36  ALT 26  ALKPHOS 58  BILITOT 1.0  PROT 7.4  ALBUMIN 3.4*   No results for input(s): LIPASE, AMYLASE in the last 168 hours. No results for input(s): AMMONIA in the last 168 hours. Coagulation Profile: No results for input(s): INR, PROTIME in the last 168 hours. Cardiac Enzymes: No results for input(s): CKTOTAL, CKMB, CKMBINDEX, TROPONINI in the last 168 hours. BNP (last 3 results) No results for input(s): PROBNP in the last 8760 hours. CBG: No results for input(s): GLUCAP in the last 168 hours. Studies: No results found.   Time spent: 35 minutes  Author:  Imogene Burn M.D on 12/29/2018 at 9:50 AM   -  To page go to [email or web address]

## 2018-12-29 NOTE — Progress Notes (Signed)
Wife Edythe updated

## 2018-12-29 NOTE — Progress Notes (Signed)
Pt pastor called to pray over pt

## 2018-12-29 NOTE — Plan of Care (Signed)
  Problem: Education: Goal: Knowledge of risk factors and measures for prevention of condition will improve Outcome: Not Progressing   Problem: Coping: Goal: Psychosocial and spiritual needs will be supported Outcome: Not Progressing   Problem: Respiratory: Goal: Will maintain a patent airway Outcome: Not Progressing Goal: Complications related to the disease process, condition or treatment will be avoided or minimized Outcome: Not Progressing   Problem: Education: Goal: Knowledge of General Education information will improve Description: Including pain rating scale, medication(s)/side effects and non-pharmacologic comfort measures Outcome: Not Progressing   Problem: Health Behavior/Discharge Planning: Goal: Ability to manage health-related needs will improve Outcome: Not Progressing

## 2018-12-29 NOTE — Plan of Care (Signed)
Pt remains on comfort care. Family updated on pt condition. Pt remains verbally unresponsive and is not following commands. Pt turned Q2 hours. Mouth care provided. VSS. UOP via condom cath. No new skin issues noted. No prn meds required. Falls precautions in place. Will continue plan of care   Problem: Education: Goal: Knowledge of risk factors and measures for prevention of condition will improve Outcome: Not Progressing   Problem: Coping: Goal: Psychosocial and spiritual needs will be supported Outcome: Not Progressing   Problem: Respiratory: Goal: Will maintain a patent airway Outcome: Not Progressing Goal: Complications related to the disease process, condition or treatment will be avoided or minimized Outcome: Not Progressing   Problem: Education: Goal: Knowledge of General Education information will improve Description: Including pain rating scale, medication(s)/side effects and non-pharmacologic comfort measures Outcome: Not Progressing   Problem: Health Behavior/Discharge Planning: Goal: Ability to manage health-related needs will improve Outcome: Not Progressing   Problem: Clinical Measurements: Goal: Ability to maintain clinical measurements within normal limits will improve Outcome: Not Progressing Goal: Will remain free from infection Outcome: Not Progressing Goal: Diagnostic test results will improve Outcome: Not Progressing Goal: Respiratory complications will improve Outcome: Not Progressing Goal: Cardiovascular complication will be avoided Outcome: Not Progressing   Problem: Activity: Goal: Risk for activity intolerance will decrease Outcome: Not Progressing   Problem: Nutrition: Goal: Adequate nutrition will be maintained Outcome: Not Progressing   Problem: Coping: Goal: Level of anxiety will decrease Outcome: Not Progressing   Problem: Elimination: Goal: Will not experience complications related to bowel motility Outcome: Not Progressing Goal:  Will not experience complications related to urinary retention Outcome: Not Progressing   Problem: Pain Managment: Goal: General experience of comfort will improve Outcome: Not Progressing   Problem: Safety: Goal: Ability to remain free from injury will improve Outcome: Not Progressing   Problem: Skin Integrity: Goal: Risk for impaired skin integrity will decrease Outcome: Not Progressing

## 2019-01-01 LAB — PTH-RELATED PEPTIDE: PTH-related peptide: <2 pmol/L

## 2019-01-23 NOTE — Progress Notes (Signed)
  Patient's respirations became more shallow and he became tachypneic. PRN dilaudid was given twice during shift for dyspnea. Patient's heart rate dropped to the 40's. Family was notified of the change in patients condition.   At approximately 1:30 pm NT noticed patients 02 saturations dropping and requested to have another RN on the floor assess the patients. No respirations noted, 2 RN's verified no HR via auscultation. Death pronounced by 2 RN's at 1:38 pm. MD was notified. MD notified family.   Post-Mortem checklist completed.

## 2019-01-23 NOTE — Death Summary Note (Signed)
[facility] Death Note                                                                                                                                                                                               Raymond Hopkins, is a 84 y.o. male, DOB - 09-27-1934, ZOX:[id number]  Admit date - 11/26/2018   Admitting Physician Patrecia Pour, MD  Outpatient Primary MD for the patient is Wenda Low, MD  LOS - 9  Chief Complaint  Patient presents with  . Fever       Notification: Wenda Low, MD notified of death of 2019/01/10   Date and Time of Death - 2019-01-10 @ 1.38pm  Pronounced by - RN  History of present illness:   Raymond Hopkins is a 84 y.o. male with a history of - Raymond Hopkins is an 84 y.o. male with a history of dementia who was sent from [facility] memory care with the chief complaint of fever. His memory care staff stated he seemed more weak and withdrawn than his baseline. There was no hypoxia or CXR infiltrates, Tmax 100.58F, found to be covid-positive with no elevation of inflammatory markers. He developed significant encephalopathy and stopped eating or drinking and continued to decline, family wanted him to be comfortable and on comfort measures.  They did not want any aggressive treatment.  He was transitioned to full comfort care and he passed away peacefully on 2019-01-10 at 1:38 PM without any distress.    Final Diagnoses:  Cause if death - Covid 21 Infection  Signature  Lala Lund M.D on Jan 10, 2019 at 2:15 PM  [facility]  Office Phone [phone]  Total clinical and documentation time for today Under 30 minutes   Last Note   [facility] Progress Note  Patient: Raymond Hopkins FAO:[id number]   PCP: Wenda Low, MD DOB: 1934-08-16   DOA: 12/12/2018   DOS: 10-Jan-2019   Date of Service: the patient was seen and examined on 2019-01-10   Brief hospital course:  Raymond Hopkins is an 84 y.o. male with a history of dementia who was sent from [facility] memory care with the chief complaint of fever. His memory care staff stated he seemed more weak and withdrawn than his baseline. There was no hypoxia or CXR infiltrates, Tmax 100.58F, found to be covid-positive with no elevation of inflammatory markers. Because the facility was unable to accept the patient back, despite clinical stability, he was brought to [place]. PT has recommended returning to previous level of care. This is pending facility bed availability which requires negative covid testing. The patient has continued to  test positive on 7/24 and 7/27. Currently further plan is continue comfort care and finding residential hospice.   Subjective: Patient in bed today much calmer, appears weaker than before, appears to be in no discomfort.   Assessment and Plan:  Covid-19 infection: Due to the patient age, he is at risk of developing severe disease, and underlying poor baseline he was transitioned to full comfort care after multiple discussions with family.  On 01/20/2019 he is more lethargic less responsive and appears close to death.  All comfort measures in place.  He is DNR.  Family on board.   1 of 2 blood culture bottles growing non-aureus staphylococcus. suspect contaminant.    Dementia without behavioral disturbance: Has been in memory care since 10/11/2018 and has been experiencing a precipitous decline since that time.  Currently stable on present cocktail of Haldol, Ativan and Dilaudid.  On full comfort measures.  Sinus bradycardia: Asymptomatic. BP has been normotensive.  Avoid AV nodal agents.  Now goal of care comfort care.  History of RLE DVT:  - Discontinued anticoagulation comfort care.  BPH:  - Monitor UOP, no retention noted.  Bladder scan as needed.  Continue home medications  Osteoarthritis: - Tylenol, tramadol prn  GERD: - Comfort care   Concern for aspiration. -Too lethargic for any oral intake at this time, full comfort care.       Diet: Soft diet DVT Prophylaxis: comfort care  Advance goals of care discussion: comfort care  Family Communication:   Updated again by me on 12/29/2018 they visited the patient on 12/28/2018 and decided that he should either stay here or go to residential hospice, they understand that he is going to pass away soon and are not interested in taking him home as they will not be able to provide him full care.   Disposition:   SNF with comfort care or to residential hospice not home.  Consultants: NONE Procedures: NONE  Scheduled Meds: .  HYDROmorphone (DILAUDID) injection  0.5 mg Intravenous BID  . Ipratropium-Albuterol  1 puff Inhalation Q6H  . LORazepam  0.5 mg Intravenous TID  . mouth rinse  15 mL Mouth Rinse q12n4p  . QUEtiapine  50 mg Oral BID  . triamcinolone cream  1 application Topical BID   Continuous Infusions: PRN Meds: acetaminophen, albuterol, antiseptic oral rinse, chlorpheniramine-HYDROcodone, glycopyrrolate **OR** glycopyrrolate **OR** glycopyrrolate, haloperidol **OR** haloperidol **OR** haloperidol lactate, HYDROmorphone (DILAUDID) injection, HYDROmorphone, lip balm, LORazepam **OR** LORazepam **OR** LORazepam, LORazepam, magic mouthwash, ondansetron **OR** ondansetron (ZOFRAN) IV, oxyCODONE **OR** oxyCODONE, phenol, polyethylene glycol Antibiotics: Anti-infectives (From admission, onward)   Start     Dose/Rate Route Frequency Ordered Stop   12/23/18 1300  remdesivir 100 mg in sodium chloride 0.9 % 250 mL IVPB  Status:  Discontinued     100 mg 500 mL/hr over 30 Minutes Intravenous Every 24 hours 12/22/18 1157 12/23/18 0701   12/22/18 1300  remdesivir 200 mg in sodium chloride 0.9 % 250 mL IVPB     200 mg 500 mL/hr over 30 Minutes Intravenous Once 12/22/18 1157 12/22/18 1458       Objective: Physical Exam: Vitals:   Jan 20, 2019 0715 2019/01/20 0900 Jan 20, 2019 1153  01-20-19 1300  BP: (!) 86/74     Pulse: (!) 134 (!) 136 (!) 43 (!) 45  Resp:      Temp: (!) 100.4 F (38 C)     TempSrc: Axillary     SpO2: (!) 88% (!) 85% (!) 79% (!) 83%  Height:  Intake/Output Summary (Last 24 hours) at 01-04-2019 1415 Last data filed at 01-04-2019 0900 Gross per 24 hour  Intake 0 ml  Output 200 ml  Net -200 ml    Somnolent today, unable to answer questions or follow commands, remains calm and appears to be in no distress [place].AT,PERRAL Supple Neck,No JVD, No cervical lymphadenopathy appriciated.  Symmetrical Chest wall movement, Good air movement bilaterally, CTAB RRR,No Gallops, Rubs or new Murmurs, No Parasternal Heave +ve B.Sounds, Abd Soft, No tenderness, No organomegaly appriciated, No rebound - guarding or rigidity. No Cyanosis, Clubbing or edema, No new Rash or bruise    Data Reviewed: CBC: No results for input(s): WBC, NEUTROABS, HGB, HCT, MCV, PLT in the last 168 hours. Basic Metabolic Panel: No results for input(s): NA, K, CL, CO2, GLUCOSE, BUN, CREATININE, CALCIUM, MG, PHOS in the last 168 hours.  Liver Function Tests: No results for input(s): AST, ALT, ALKPHOS, BILITOT, PROT, ALBUMIN in the last 168 hours. No results for input(s): LIPASE, AMYLASE in the last 168 hours. No results for input(s): AMMONIA in the last 168 hours. Coagulation Profile: No results for input(s): INR, PROTIME in the last 168 hours. Cardiac Enzymes: No results for input(s): CKTOTAL, CKMB, CKMBINDEX, TROPONINI in the last 168 hours. BNP (last 3 results) No results for input(s): PROBNP in the last 8760 hours. CBG: No results for input(s): GLUCAP in the last 168 hours. Studies: No results found.   Time spent: 35 minutes  Author:  Imogene Burn M.D on 01-04-2019 at 2:15 PM   -  To page go to [email or web address]

## 2019-01-23 NOTE — Progress Notes (Signed)
Family member updated. Spouse, Edythe, given an update on patients condition.

## 2019-01-23 NOTE — Progress Notes (Signed)
[facility] Progress Note  Patient: Raymond Hopkins:[id number]   PCP: Wenda Low, MD DOB: 06-06-1934   DOA: 12/20/2018   DOS: 01-12-19   Date of Service: the patient was seen and examined on 12-Jan-2019  Brief hospital course:  Raymond Hopkins is an 84 y.o. male with a history of dementia who was sent from [facility] memory care with the chief complaint of fever. His memory care staff stated he seemed more weak and withdrawn than his baseline. There was no hypoxia or CXR infiltrates, Tmax 100.32F, found to be covid-positive with no elevation of inflammatory markers. Because the facility was unable to accept the patient back, despite clinical stability, he was brought to [place]. PT has recommended returning to previous level of care. This is pending facility bed availability which requires negative covid testing. The patient has continued to test positive on 7/24 and 7/27. Currently further plan is continue comfort care and finding residential hospice.   Subjective: Patient in bed today much calmer, appears weaker than before, appears to be in no discomfort.   Assessment and Plan:  Covid-19 infection: Due to the patient age, he is at risk of developing severe disease, and underlying poor baseline he was transitioned to full comfort care after multiple discussions with family.  On January 12, 2019 he is more lethargic less responsive and appears close to death.  All comfort measures in place.  He is DNR.  Family on board.   1 of 2 blood culture bottles growing non-aureus staphylococcus. suspect contaminant.    Dementia without behavioral disturbance: Has been in memory care since 10/11/2018 and has been experiencing a precipitous decline since that time.  Currently stable on present cocktail of Haldol, Ativan and Dilaudid.  On full comfort measures.  Sinus bradycardia: Asymptomatic. BP has been normotensive.  Avoid AV nodal agents.  Now goal of care comfort care.  History of RLE  DVT:  - Discontinued anticoagulation comfort care.  BPH:  - Monitor UOP, no retention noted.  Bladder scan as needed.  Continue home medications  Osteoarthritis: - Tylenol, tramadol prn  GERD: - Comfort care  Concern for aspiration. -Too lethargic for any oral intake at this time, full comfort care.       Diet: Soft diet DVT Prophylaxis: comfort care  Advance goals of care discussion: comfort care  Family Communication:   Updated again by me on 12/29/2018 they visited the patient on 12/28/2018 and decided that he should either stay here or go to residential hospice, they understand that he is going to pass away soon and are not interested in taking him home as they will not be able to provide him full care.   Disposition:   SNF with comfort care or to residential hospice not home.  Consultants: NONE Procedures: NONE  Scheduled Meds: .  HYDROmorphone (DILAUDID) injection  0.5 mg Intravenous BID  . Ipratropium-Albuterol  1 puff Inhalation Q6H  . LORazepam  0.5 mg Intravenous TID  . mouth rinse  15 mL Mouth Rinse q12n4p  . QUEtiapine  50 mg Oral BID  . triamcinolone cream  1 application Topical BID   Continuous Infusions: PRN Meds: acetaminophen, albuterol, antiseptic oral rinse, chlorpheniramine-HYDROcodone, glycopyrrolate **OR** glycopyrrolate **OR** glycopyrrolate, haloperidol **OR** haloperidol **OR** haloperidol lactate, HYDROmorphone (DILAUDID) injection, HYDROmorphone, lip balm, LORazepam **OR** LORazepam **OR** LORazepam, LORazepam, magic mouthwash, ondansetron **OR** ondansetron (ZOFRAN) IV, oxyCODONE **OR** oxyCODONE, phenol, polyethylene glycol Antibiotics: Anti-infectives (From admission, onward)   Start     Dose/Rate Route Frequency Ordered Stop  12/23/18 1300  remdesivir 100 mg in sodium chloride 0.9 % 250 mL IVPB  Status:  Discontinued     100 mg 500 mL/hr over 30 Minutes Intravenous Every 24 hours 12/22/18 1157 12/23/18 0701   12/22/18 1300  remdesivir  200 mg in sodium chloride 0.9 % 250 mL IVPB     200 mg 500 mL/hr over 30 Minutes Intravenous Once 12/22/18 1157 12/22/18 1458       Objective: Physical Exam: Vitals:   12/29/18 0546 12/29/18 0749 January 01, 2019 0245 01-Jan-2019 0715  BP: 103/78 (!) 124/51  (!) 86/74  Pulse:  100 (!) 137 (!) 134  Resp:  (!) 22 (!) 50   Temp: 97.7 F (36.5 C) 97.8 F (36.6 C)  (!) 100.4 F (38 C)  TempSrc: Oral Axillary  Axillary  SpO2:  94% 91% (!) 88%  Height:        Intake/Output Summary (Last 24 hours) at 01/01/19 0855 Last data filed at 01-01-19 0600 Gross per 24 hour  Intake 0 ml  Output 200 ml  Net -200 ml    Somnolent today, unable to answer questions or follow commands, remains calm and appears to be in no distress [name].AT,PERRAL Supple Neck,No JVD, No cervical lymphadenopathy appriciated.  Symmetrical Chest wall movement, Good air movement bilaterally, CTAB RRR,No Gallops, Rubs or new Murmurs, No Parasternal Heave +ve B.Sounds, Abd Soft, No tenderness, No organomegaly appriciated, No rebound - guarding or rigidity. No Cyanosis, Clubbing or edema, No new Rash or bruise    Data Reviewed: CBC: No results for input(s): WBC, NEUTROABS, HGB, HCT, MCV, PLT in the last 168 hours. Basic Metabolic Panel: No results for input(s): NA, K, CL, CO2, GLUCOSE, BUN, CREATININE, CALCIUM, MG, PHOS in the last 168 hours.  Liver Function Tests: No results for input(s): AST, ALT, ALKPHOS, BILITOT, PROT, ALBUMIN in the last 168 hours. No results for input(s): LIPASE, AMYLASE in the last 168 hours. No results for input(s): AMMONIA in the last 168 hours. Coagulation Profile: No results for input(s): INR, PROTIME in the last 168 hours. Cardiac Enzymes: No results for input(s): CKTOTAL, CKMB, CKMBINDEX, TROPONINI in the last 168 hours. BNP (last 3 results) No results for input(s): PROBNP in the last 8760 hours. CBG: No results for input(s): GLUCAP in the last 168 hours. Studies: No results found.   Time  spent: 35 minutes  Author:  Imogene Burn M.D on 01-Jan-2019 at 8:55 AM   -  To page go to [email or web address]

## 2019-01-23 DEATH — deceased
